# Patient Record
Sex: Male | Born: 1935 | Race: White | Hispanic: No | Smoking: Never smoker
Health system: Southern US, Community
[De-identification: ages and names within clinical notes are randomized; demographics above are authoritative.]

## PROBLEM LIST (undated history)

## (undated) DIAGNOSIS — D649 Anemia, unspecified: Secondary | ICD-10-CM

## (undated) DIAGNOSIS — F419 Anxiety disorder, unspecified: Secondary | ICD-10-CM

## (undated) DIAGNOSIS — N138 Other obstructive and reflux uropathy: Secondary | ICD-10-CM

## (undated) DIAGNOSIS — E785 Hyperlipidemia, unspecified: Secondary | ICD-10-CM

## (undated) DIAGNOSIS — K589 Irritable bowel syndrome without diarrhea: Secondary | ICD-10-CM

## (undated) DIAGNOSIS — H9190 Unspecified hearing loss, unspecified ear: Secondary | ICD-10-CM

## (undated) DIAGNOSIS — R972 Elevated prostate specific antigen [PSA]: Secondary | ICD-10-CM

## (undated) DIAGNOSIS — K219 Gastro-esophageal reflux disease without esophagitis: Secondary | ICD-10-CM

## (undated) DIAGNOSIS — L03119 Cellulitis of unspecified part of limb: Secondary | ICD-10-CM

## (undated) DIAGNOSIS — N401 Enlarged prostate with lower urinary tract symptoms: Secondary | ICD-10-CM

## (undated) DIAGNOSIS — N200 Calculus of kidney: Secondary | ICD-10-CM

## (undated) DIAGNOSIS — E039 Hypothyroidism, unspecified: Secondary | ICD-10-CM

## (undated) DIAGNOSIS — Z87442 Personal history of urinary calculi: Secondary | ICD-10-CM

## (undated) DIAGNOSIS — G2581 Restless legs syndrome: Secondary | ICD-10-CM

## (undated) HISTORY — DX: Elevated prostate specific antigen (PSA): R97.20

## (undated) HISTORY — DX: Other obstructive and reflux uropathy: N40.1

## (undated) HISTORY — DX: Hyperlipidemia, unspecified: E78.5

## (undated) HISTORY — PX: OTHER SURGICAL HISTORY: SHX169

## (undated) HISTORY — DX: Other obstructive and reflux uropathy: N13.8

## (undated) HISTORY — PX: FRACTURE SURGERY: SHX138

## (undated) HISTORY — PX: HERNIA REPAIR: SHX51

## (undated) HISTORY — DX: Calculus of kidney: N20.0

## (undated) HISTORY — DX: Irritable bowel syndrome, unspecified: K58.9

## (undated) HISTORY — PX: CHOLECYSTECTOMY: SHX55

## (undated) HISTORY — DX: Cellulitis of unspecified part of limb: L03.119

---

## 2004-03-24 ENCOUNTER — Ambulatory Visit: Payer: Self-pay | Admitting: Internal Medicine

## 2004-09-23 ENCOUNTER — Ambulatory Visit: Payer: Self-pay | Admitting: Internal Medicine

## 2005-03-28 ENCOUNTER — Ambulatory Visit: Payer: Self-pay | Admitting: Internal Medicine

## 2005-09-26 ENCOUNTER — Ambulatory Visit: Payer: Self-pay | Admitting: Internal Medicine

## 2005-09-30 ENCOUNTER — Ambulatory Visit: Payer: Self-pay | Admitting: Internal Medicine

## 2005-11-23 ENCOUNTER — Other Ambulatory Visit: Payer: Self-pay

## 2005-11-23 ENCOUNTER — Emergency Department: Payer: Self-pay | Admitting: Emergency Medicine

## 2005-11-23 ENCOUNTER — Ambulatory Visit: Payer: Self-pay | Admitting: Surgery

## 2005-11-24 ENCOUNTER — Ambulatory Visit: Payer: Self-pay | Admitting: Emergency Medicine

## 2005-12-28 ENCOUNTER — Ambulatory Visit: Payer: Self-pay | Admitting: Surgery

## 2006-03-30 ENCOUNTER — Ambulatory Visit: Payer: Self-pay | Admitting: Internal Medicine

## 2006-10-03 ENCOUNTER — Ambulatory Visit: Payer: Self-pay | Admitting: Internal Medicine

## 2007-04-05 ENCOUNTER — Ambulatory Visit: Payer: Self-pay | Admitting: Internal Medicine

## 2008-04-09 ENCOUNTER — Ambulatory Visit: Payer: Self-pay | Admitting: Internal Medicine

## 2009-05-01 HISTORY — PX: COLONOSCOPY: SHX174

## 2009-05-04 ENCOUNTER — Ambulatory Visit: Payer: Self-pay | Admitting: Gastroenterology

## 2010-03-02 ENCOUNTER — Ambulatory Visit: Payer: Self-pay | Admitting: Internal Medicine

## 2011-06-23 ENCOUNTER — Ambulatory Visit: Payer: Self-pay | Admitting: Surgery

## 2011-06-23 DIAGNOSIS — R9431 Abnormal electrocardiogram [ECG] [EKG]: Secondary | ICD-10-CM

## 2011-07-01 ENCOUNTER — Emergency Department: Payer: Self-pay | Admitting: Internal Medicine

## 2011-07-01 ENCOUNTER — Ambulatory Visit: Payer: Self-pay | Admitting: Surgery

## 2011-07-02 ENCOUNTER — Emergency Department: Payer: Self-pay | Admitting: Emergency Medicine

## 2013-12-14 LAB — URINALYSIS, COMPLETE
BILIRUBIN, UR: NEGATIVE
Bacteria: NONE SEEN
GLUCOSE, UR: NEGATIVE mg/dL (ref 0–75)
LEUKOCYTE ESTERASE: NEGATIVE
NITRITE: NEGATIVE
Ph: 5 (ref 4.5–8.0)
Protein: 30
Specific Gravity: 1.025 (ref 1.003–1.030)

## 2013-12-14 LAB — COMPREHENSIVE METABOLIC PANEL
ANION GAP: 6 — AB (ref 7–16)
Albumin: 3.2 g/dL — ABNORMAL LOW (ref 3.4–5.0)
Alkaline Phosphatase: 138 U/L — ABNORMAL HIGH
BUN: 23 mg/dL — ABNORMAL HIGH (ref 7–18)
Bilirubin,Total: 0.6 mg/dL (ref 0.2–1.0)
CALCIUM: 8.3 mg/dL — AB (ref 8.5–10.1)
CO2: 28 mmol/L (ref 21–32)
Chloride: 103 mmol/L (ref 98–107)
Creatinine: 1.17 mg/dL (ref 0.60–1.30)
GFR CALC AF AMER: 58 — AB
GFR CALC NON AF AMER: 48 — AB
Glucose: 104 mg/dL — ABNORMAL HIGH (ref 65–99)
OSMOLALITY: 278 (ref 275–301)
POTASSIUM: 4 mmol/L (ref 3.5–5.1)
SGOT(AST): 108 U/L — ABNORMAL HIGH (ref 15–37)
SGPT (ALT): 178 U/L — ABNORMAL HIGH
Sodium: 137 mmol/L (ref 136–145)
TOTAL PROTEIN: 7 g/dL (ref 6.4–8.2)

## 2013-12-14 LAB — CBC WITH DIFFERENTIAL/PLATELET
BASOS ABS: 0 10*3/uL (ref 0.0–0.1)
BASOS PCT: 0.3 %
Eosinophil #: 0 10*3/uL (ref 0.0–0.7)
Eosinophil %: 0 %
HCT: 36.7 % — ABNORMAL LOW (ref 40.0–52.0)
HGB: 12.4 g/dL — ABNORMAL LOW (ref 13.0–18.0)
LYMPHS ABS: 0.8 10*3/uL — AB (ref 1.0–3.6)
Lymphocyte %: 6 %
MCH: 32.8 pg (ref 26.0–34.0)
MCHC: 33.9 g/dL (ref 32.0–36.0)
MCV: 97 fL (ref 80–100)
MONO ABS: 0.6 x10 3/mm (ref 0.2–1.0)
MONOS PCT: 4.1 %
NEUTROS ABS: 12.5 10*3/uL — AB (ref 1.4–6.5)
Neutrophil %: 89.6 %
Platelet: 119 10*3/uL — ABNORMAL LOW (ref 150–440)
RBC: 3.79 10*6/uL — AB (ref 4.40–5.90)
RDW: 14 % (ref 11.5–14.5)
WBC: 14 10*3/uL — ABNORMAL HIGH (ref 3.8–10.6)

## 2013-12-15 ENCOUNTER — Inpatient Hospital Stay: Payer: Self-pay | Admitting: Internal Medicine

## 2013-12-15 LAB — HEMOGLOBIN A1C: Hemoglobin A1C: 5.1 % (ref 4.2–6.3)

## 2013-12-16 LAB — CBC WITH DIFFERENTIAL/PLATELET
Basophil #: 0 10*3/uL (ref 0.0–0.1)
Basophil %: 0.5 %
Eosinophil #: 0 10*3/uL (ref 0.0–0.7)
Eosinophil %: 0.4 %
HCT: 31.6 % — ABNORMAL LOW (ref 40.0–52.0)
HGB: 10.7 g/dL — AB (ref 13.0–18.0)
LYMPHS PCT: 18.9 %
Lymphocyte #: 1.4 10*3/uL (ref 1.0–3.6)
MCH: 32.7 pg (ref 26.0–34.0)
MCHC: 33.8 g/dL (ref 32.0–36.0)
MCV: 97 fL (ref 80–100)
MONO ABS: 0.7 x10 3/mm (ref 0.2–1.0)
Monocyte %: 9.2 %
Neutrophil #: 5.4 10*3/uL (ref 1.4–6.5)
Neutrophil %: 71 %
PLATELETS: 105 10*3/uL — AB (ref 150–440)
RBC: 3.26 10*6/uL — AB (ref 4.40–5.90)
RDW: 13.9 % (ref 11.5–14.5)
WBC: 7.6 10*3/uL (ref 3.8–10.6)

## 2013-12-16 LAB — BASIC METABOLIC PANEL
Anion Gap: 4 — ABNORMAL LOW (ref 7–16)
BUN: 11 mg/dL (ref 7–18)
CALCIUM: 7.8 mg/dL — AB (ref 8.5–10.1)
Chloride: 107 mmol/L (ref 98–107)
Co2: 28 mmol/L (ref 21–32)
Creatinine: 1.04 mg/dL (ref 0.60–1.30)
GLUCOSE: 86 mg/dL (ref 65–99)
Osmolality: 276 (ref 275–301)
POTASSIUM: 3.8 mmol/L (ref 3.5–5.1)
SODIUM: 139 mmol/L (ref 136–145)

## 2013-12-17 LAB — CBC WITH DIFFERENTIAL/PLATELET
Basophil #: 0.1 10*3/uL (ref 0.0–0.1)
Basophil %: 0.8 %
EOS PCT: 1.7 %
Eosinophil #: 0.1 10*3/uL (ref 0.0–0.7)
HCT: 31.1 % — AB (ref 40.0–52.0)
HGB: 10.4 g/dL — ABNORMAL LOW (ref 13.0–18.0)
LYMPHS ABS: 1.7 10*3/uL (ref 1.0–3.6)
Lymphocyte %: 26.2 %
MCH: 32.6 pg (ref 26.0–34.0)
MCHC: 33.5 g/dL (ref 32.0–36.0)
MCV: 97 fL (ref 80–100)
Monocyte #: 0.8 x10 3/mm (ref 0.2–1.0)
Monocyte %: 12.8 %
NEUTROS ABS: 3.7 10*3/uL (ref 1.4–6.5)
Neutrophil %: 58.5 %
Platelet: 108 10*3/uL — ABNORMAL LOW (ref 150–440)
RBC: 3.2 10*6/uL — ABNORMAL LOW (ref 4.40–5.90)
RDW: 14.1 % (ref 11.5–14.5)
WBC: 6.3 10*3/uL (ref 3.8–10.6)

## 2013-12-18 LAB — BASIC METABOLIC PANEL
Anion Gap: 5 — ABNORMAL LOW (ref 7–16)
BUN: 6 mg/dL — ABNORMAL LOW (ref 7–18)
CO2: 29 mmol/L (ref 21–32)
Calcium, Total: 8.2 mg/dL — ABNORMAL LOW (ref 8.5–10.1)
Chloride: 104 mmol/L (ref 98–107)
Creatinine: 0.91 mg/dL (ref 0.60–1.30)
Glucose: 94 mg/dL (ref 65–99)
Osmolality: 273 (ref 275–301)
POTASSIUM: 3.7 mmol/L (ref 3.5–5.1)
Sodium: 138 mmol/L (ref 136–145)

## 2013-12-18 LAB — CBC WITH DIFFERENTIAL/PLATELET
BASOS ABS: 0.1 10*3/uL (ref 0.0–0.1)
BASOS PCT: 0.7 %
EOS ABS: 0.1 10*3/uL (ref 0.0–0.7)
Eosinophil %: 1.4 %
HCT: 32.4 % — ABNORMAL LOW (ref 40.0–52.0)
HGB: 10.9 g/dL — AB (ref 13.0–18.0)
LYMPHS PCT: 17.9 %
Lymphocyte #: 1.4 10*3/uL (ref 1.0–3.6)
MCH: 32.6 pg (ref 26.0–34.0)
MCHC: 33.6 g/dL (ref 32.0–36.0)
MCV: 97 fL (ref 80–100)
MONO ABS: 1 x10 3/mm (ref 0.2–1.0)
Monocyte %: 12.7 %
NEUTROS ABS: 5.3 10*3/uL (ref 1.4–6.5)
Neutrophil %: 67.3 %
Platelet: 141 10*3/uL — ABNORMAL LOW (ref 150–440)
RBC: 3.33 10*6/uL — AB (ref 4.40–5.90)
RDW: 13.8 % (ref 11.5–14.5)
WBC: 7.9 10*3/uL (ref 3.8–10.6)

## 2013-12-18 LAB — VANCOMYCIN, TROUGH: Vancomycin, Trough: 7 ug/mL — ABNORMAL LOW (ref 10–20)

## 2013-12-19 LAB — BASIC METABOLIC PANEL
ANION GAP: 6 — AB (ref 7–16)
BUN: 4 mg/dL — AB (ref 7–18)
CALCIUM: 7.9 mg/dL — AB (ref 8.5–10.1)
Chloride: 107 mmol/L (ref 98–107)
Co2: 28 mmol/L (ref 21–32)
Creatinine: 0.87 mg/dL (ref 0.60–1.30)
EGFR (African American): >60
EGFR (Non-African Amer.): >60
GLUCOSE: 79 mg/dL (ref 65–99)
Osmolality: 277 (ref 275–301)
Potassium: 3.7 mmol/L (ref 3.5–5.1)
Sodium: 141 mmol/L (ref 136–145)

## 2013-12-19 LAB — CBC WITH DIFFERENTIAL/PLATELET
BASOS ABS: 0.1 10*3/uL (ref 0.0–0.1)
BASOS PCT: 0.6 %
EOS ABS: 0.2 10*3/uL (ref 0.0–0.7)
Eosinophil %: 2.1 %
HCT: 30.7 % — ABNORMAL LOW (ref 40.0–52.0)
HGB: 10.4 g/dL — AB (ref 13.0–18.0)
LYMPHS ABS: 1.9 10*3/uL (ref 1.0–3.6)
LYMPHS PCT: 19.1 %
MCH: 32.6 pg (ref 26.0–34.0)
MCHC: 33.8 g/dL (ref 32.0–36.0)
MCV: 97 fL (ref 80–100)
MONOS PCT: 9.8 %
Monocyte #: 1 x10 3/mm (ref 0.2–1.0)
NEUTROS ABS: 6.7 10*3/uL — AB (ref 1.4–6.5)
Neutrophil %: 68.4 %
Platelet: 156 10*3/uL (ref 150–440)
RBC: 3.18 10*6/uL — AB (ref 4.40–5.90)
RDW: 14 % (ref 11.5–14.5)
WBC: 9.7 10*3/uL (ref 3.8–10.6)

## 2013-12-19 LAB — CULTURE, BLOOD (SINGLE)

## 2013-12-20 LAB — CBC WITH DIFFERENTIAL/PLATELET
Comment - H1-Com1: NORMAL
Eosinophil: 1 %
HCT: 30.1 % — ABNORMAL LOW (ref 40.0–52.0)
HGB: 10.1 g/dL — ABNORMAL LOW (ref 13.0–18.0)
Lymphocytes: 31 %
MCH: 33 pg (ref 26.0–34.0)
MCHC: 33.6 g/dL (ref 32.0–36.0)
MCV: 98 fL (ref 80–100)
Monocytes: 6 %
Myelocyte: 2 %
Platelet: 198 10*3/uL (ref 150–440)
RBC: 3.07 10*6/uL — ABNORMAL LOW (ref 4.40–5.90)
RDW: 13.7 % (ref 11.5–14.5)
Segmented Neutrophils: 60 %
WBC: 9.2 10*3/uL (ref 3.8–10.6)

## 2013-12-20 LAB — BASIC METABOLIC PANEL
ANION GAP: 4 — AB (ref 7–16)
BUN: 5 mg/dL — ABNORMAL LOW (ref 7–18)
CALCIUM: 7.7 mg/dL — AB (ref 8.5–10.1)
Chloride: 109 mmol/L — ABNORMAL HIGH (ref 98–107)
Co2: 29 mmol/L (ref 21–32)
Creatinine: 0.84 mg/dL (ref 0.60–1.30)
EGFR (Non-African Amer.): >60
Glucose: 76 mg/dL (ref 65–99)
Osmolality: 279 (ref 275–301)
Potassium: 3.9 mmol/L (ref 3.5–5.1)
Sodium: 142 mmol/L (ref 136–145)

## 2013-12-20 LAB — VANCOMYCIN, TROUGH: VANCOMYCIN, TROUGH: 16 ug/mL (ref 10–20)

## 2013-12-21 LAB — CBC WITH DIFFERENTIAL/PLATELET
Bands: 1 %
Eosinophil: 2 %
HCT: 30.8 % — ABNORMAL LOW
HGB: 10.1 g/dL — ABNORMAL LOW
Lymphocytes: 27 %
MCH: 32.4 pg
MCHC: 32.9 g/dL
MCV: 98 fL
Metamyelocyte: 4 %
Monocytes: 6 %
Platelet: 243 x10 3/mm 3
RBC: 3.13 x10 6/mm 3 — ABNORMAL LOW
RDW: 14 %
Segmented Neutrophils: 60 %
WBC: 9.6 x10 3/mm 3

## 2014-04-01 DIAGNOSIS — E039 Hypothyroidism, unspecified: Secondary | ICD-10-CM | POA: Insufficient documentation

## 2014-04-01 DIAGNOSIS — H913 Deaf nonspeaking, not elsewhere classified: Secondary | ICD-10-CM | POA: Insufficient documentation

## 2014-04-01 DIAGNOSIS — G2581 Restless legs syndrome: Secondary | ICD-10-CM | POA: Insufficient documentation

## 2014-05-24 NOTE — H&P (Signed)
PATIENT NAMWaldemar Huerta:  Huerta, Ronald D MR#:  [id number] DATE OF BIRTH:  07/15/1934  DATE OF ADMISSION:  12/15/2013  PRIMARY CARE PHYSICIAN: Ronald ReichmannVishwanath Hande, MD  REQUESTING PHYSICIAN: Ronald BreezeGraydon S. Goodman, MD  CHIEF COMPLAINT: Right leg swelling and pain.  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 Patient is unable to walk, started to have a fever. Concerning this, came to the Emergency Department. Workup in the Emergency Department, patient is found to have fever of 101.4, has elevated white blood cell count of 14,000 with a left shift of 90%. Patient is given vancomycin in the Emergency Department.  PAST MEDICAL HISTORY: Hypothyroidism.   PAST SURGICAL HISTORY: 1.  Cholecystectomy.  2.  Hernia repair.  ALLERGIES: No known drug allergies.  HOME MEDICATIONS: Synthroid.   SOCIAL HISTORY: No history of smoking, drinking alcohol, or using illicit drugs. Lives with his daughter.  FAMILY HISTORY: Hypertension.  REVIEW OF SYSTEMS: CONSTITUTIONAL: Denies any generalized weakness. EYES: No change in vision. EARS, NOSE, AND THROAT: Patient is deaf.  RESPIRATORY: No cough, shortness of breath. CARDIOVASCULAR: No chest pain, palpitations. GASTROINTESTINAL: No nausea, vomiting, abdominal pain. GENITOURINARY: No dysuria or hematuria. HEMATOLOGIC: No easy bruising or bleeding. SKIN: Has right lower extremity swelling. MUSCULOSKELETAL: Pain in the right ankle.  NEUROLOGIC: No weakness or numbness in any part of the body.  PHYSICAL EXAMINATION: GENERAL: This is a well-built, well-nourished, age-appropriate male. VITAL SIGNS: Temperature 101.4, pulse 81, blood pressure 118/62, respiratory rate of 18, oxygen saturation 99% on room air. HEENT: Head normocephalic, atraumatic. There is no scleral icterus. Conjunctivae normal. Pupils  equal and reactive. Mucous membranes moist. No pharyngeal erythema. NECK: Supple. No lymphadenopathy. No JVD. No carotid bruit. No thyromegaly.  CHEST: Has no focal tenderness. LUNGS: Bilaterally clear to auscultation. HEART: S1, S2 regular. No murmurs are heard. ABDOMEN: Bowel sounds plus. Soft, nontender, nondistended. No hepatosplenomegaly. EXTREMITIES: Right foot with redness and swelling extending up to the knee with lymphangitic streaking. Pulses 2+. MUSCULOSKELETAL: Good range of motion all extremities.  NEUROLOGIC: Patient is alert, oriented to place, person, and time. Cranial nerves II-XII intact. Motor 5/5 in upper and lower extremities.   LABORATORY DATA: CBC: WBC of 14,000, hemoglobin 12.4, platelet count of 119,000. BMP: BUN 23, creatinine of 1.17. UA negative for nitrites and leukocyte esterase.  ASSESSMENT AND PLAN: Ronald Huerta, a 80 year old male, comes with right leg redness and cellulitis. 1.  Cellulitis of the right foot and ankle. This is secondary to athlete's foot. Could be the source of the entry. Denies any trauma. Start the patient on vancomycin. The patient is not a diabetic. No risk for gram-negative organisms. 2.  Hypothyroidism. Continue the Synthroid. 3.  Athlete's foot. Apply clotrimazole ointment to the interdigital space.  TIME SPENT: 50 minutes.   ____________________________ Ronald GriffinsPadmaja Malgorzata Albert, MD pv:ST D: 12/15/2013 00:10:43 ET T: 12/15/2013 01:07:41 ET JOB#: [id number]  cc: Ronald GriffinsPadmaja Kari Kerth, MD, <Dictator> Ronald ReichmannVishwanath Hande, MD Ronald GriffinsPADMAJA Ridhi Hoffert MD ELECTRONICALLY SIGNED 12/27/2013 22:37

## 2014-05-24 NOTE — Consult Note (Signed)
PATIENT NAMWaldemar Huerta:  Ronald Huerta MR#:  [id number] DATE OF BIRTH:  09-19-1934  DATE OF CONSULTATION:  12/17/2013  REFERRING PHYSICIAN:   CONSULTING PHYSICIAN:  Stann Mainlandavid P. Sampson GoonFitzgerald, MD  REQUESTING PHYSICIAN:  Dr. Barbette ReichmannVishwanath Hande.    REASON FOR CONSULTATION:  Progressive cellulitis.   HISTORY OF PRESENT ILLNESS: This is a very pleasant 80 year old gentleman admitted November 15 with right lower extremity redness and pain. He has a history of hypothyroidism and is deaf, but otherwise relatively healthy. He said this had been ongoing for 2 days and he had been having difficulty walking and fever. In the Emergency Room he was found to have a fever of 101.4 and a white count of 14,000 with 90% left shift. He was given vancomycin and then admitted.   Since admission the redness has continues to spread and he has had fever. He has been having some redness and streaking up his inner thigh.   PAST MEDICAL HISTORY:  1.  Hypothyroidism.  2.  Deafness.   PAST SURGICAL HISTORY: Cholecystectomy, hernia repair.   ALLERGIES: No known drug allergies.   HOME MEDICATIONS: Synthroid.   ANTIBIOTICS SINCE ADMISSION: Include Keflex given November 14, ceftriaxone given November 15, Zosyn begun November 15, and vancomycin begun November 14.   ALLERGIES: No known drug allergies.   REVIEW OF SYSTEMS: Unable to be obtained due to deafness.   PHYSICAL EXAMINATION:  VITAL SIGNS: Temperature currently is 98.9, T-max last 24 hours 100.8, pulse 69, blood pressure 103/58, respirations 19, saturation 93% on room air.  GENERAL: He is pleasant, interactive, in no acute distress.  HEENT: Pupils equal, round, reactive to light and accommodation. Extraocular movements are intact. Oropharynx is clear.  NECK: Supple.  HEART: Regular.  LUNGS: Clear.  ABDOMEN: Soft, nontender.  EXTREMITIES: His right lower extremity 2 + edema. He has moderate redness anteriorly over his shins wrapping around circumferentially. This extends  up to almost his knee. There is streaking on the interior of his upper thigh. No open wounds, bullae or abscesses are noted.   DATA: Ultrasound of the leg November 16 negative for DVT. White blood count on admission 14.0, currently 6.3, hemoglobin 10.4, platelets 108,000, on admission 119,000. Blood cultures 2 of 2 November 14 negative. Urinalysis negative. Renal function shows normal creatinine at 1.04. LFTs showed slight elevation of alkaline phosphatase, AST, and ALT.   IMPRESSION: A 80 year old gentleman, relatively healthy, admitted with acute onset right lower extremity redness, swelling, fever, and leukocytosis. This is most likely a streptococcal cellulitis. He is currently on vancomycin and Zosyn. He has had recurrent fever to 100.8, but his white count has improved remarkably. He does have some lymphangitic spread up his inner thigh. I think the main issue at this point is that he is not elevating the leg and there is massive edema.   RECOMMENDATIONS:  1.  Continue vancomycin and Zosyn.  2.  I have explained to the patient to elevate the leg and given him extra blankets and pillows. Will recommend to keep it elevated over the next 24 hours.  3.  I have marked the area of erythema with a pen.  4.  He could likely be discharged in the next few days on oral therapy if the leg improves with elevation.  5.  Thank you for the consult. I will be glad to follow with you.      ____________________________ Stann Mainlandavid P. Sampson GoonFitzgerald, MD dpf:bu Huerta: 12/17/2013 15:07:36 ET T: 12/17/2013 15:32:52 ET JOB#: [id number]  cc: Stann Mainlandavid P. Sampson GoonFitzgerald, MD, <  Dictator> Omelia Marquart Sampson Goon MD ELECTRONICALLY SIGNED 12/18/2013 20:49

## 2014-05-24 NOTE — Discharge Summary (Signed)
PATIENT NAME:  Ronald DickensHANDY, Scotty D MR#:  [id number] DATE OF BIRTH:  April 26, 1934  DATE OF ADMISSION:  12/15/2013 DATE OF DISCHARGE:  12/23/2013  DIAGNOSES AT TIME OF DISCHARGE: 1. Cellulitis in the right foot, ankle and right lower extremity.  2. History of hypothyroidism.   CHIEF COMPLAINT: Right leg swelling and pain.   HOSPITAL COURSE: [name] is a 80 year old male who is a deaf mute with a history of hypothyroidism, presented to the ED complaining of redness, swelling and pain in the right ankle for about 2 days duration. The patient also had a fever and had difficulty walking. The patient was noted to have a fever of 101.4 on admission with an elevated white cell count of 14,000 with left shift 90%. He was given IV vancomycin in the ED and subsequently started on IV Rocephin. This was changed IV Zosyn and vancomycin. During his stay in the hospital, he was also evaluated by Dr. Sherrie MustacheFisher, infectious disease specialist, who agreed with antibiotic therapy. This was subsequently changed to IV vancomycin and symptomatically, the patient did improve with improvement in white cell count down to 9.6 on discharge. He also had some right lower extremity pain, for which he was started gabapentin which appeared to help to some extent. He was seen by physical therapy. Venous Doppler was also done which was negative for DVT. He was discharged in stable condition on the following medications.   DISCHARGE MEDICATIONS: Cephalexin 500 mg p.o. 3 times a day for 14 days, doxycycline 100 mg p.o. b.i.d. for 14 days, acetaminophen 325 two tablets every 6 hours p.r.n., gabapentin 100 mg p.o. every 8, Ensure Plus 3 times a day with meals, docusate sodium 100 mg p.o. b.i.d., B12 - 1000 mcg once a day, fish oil 500 mg 2 capsules b.i.d., folic acid 0.4 mg once a day, saw palmetto 540 mg once a day, levothyroxine 25 mcg once a day and omeprazole 20 mg once a day.   FOLLOWUP: The patient has been advised to follow up with me, Dr.  Marcello FennelHande, in 2 to 3 weeks' time. He has been advised to keep his right leg elevated as much as possible. Call back with any questions or concerns.   TOTAL TIME SPENT ON DISCHARGING THIS PATIENT: 35 minutes.     ____________________________ Barbette ReichmannVishwanath Nathanuel Cabreja, MD vh:TT D: 12/27/2013 13:15:39 ET T: 12/27/2013 14:27:22 ET JOB#: [id number]  cc: Barbette ReichmannVishwanath Rockelle Heuerman, MD, <Dictator> Barbette ReichmannVISHWANATH Clark Clowdus MD ELECTRONICALLY SIGNED 12/31/2013 17:49

## 2014-05-25 NOTE — Op Note (Signed)
PATIENT NAME:  Ronald DickensHANDY, Earnestine D MR#:  [id number] DATE OF BIRTH:  [birth year]  DATE OF PROCEDURE:  07/01/2011  PREOPERATIVE DIAGNOSIS: Right inguinal hernia.   POSTOPERATIVE DIAGNOSIS: Right inguinal hernia.   PROCEDURE: Right inguinal hernia repair.   SURGEON: Adella HareJ. Wilton Smith, MD  ANESTHESIA: General.   INDICATIONS: This 80 year old male has recently had bulging in the right groin and right inguinal hernia was demonstrated on physical exam and repair recommended for definitive treatment.   DESCRIPTION OF PROCEDURE: The patient was placed on the operating table in the supine position under general anesthesia. The right lower quadrant was clipped and prepared with ChloraPrep, draped in sterile manner.   A right lower quadrant transversely oriented suprapubic incision was made, carried down through subcutaneous tissues. One traversing vein was divided between 4-0 Vicryl ligatures. Scarpa's fascia was incised. The external oblique aponeurosis was incised along the course of its fibers to open the external ring and expose the inguinal cord structures. The cord structures were mobilized exposing a direct inguinal hernia. The cremaster fibers were spread to examine the cord structures. There was a cord lipoma which was dissected free from surrounding structures, was some 4 cm in length and amputated at the level of the internal ring and ligated with 4-0 Vicryl. Cord structures were further examined. There was no indirect hernia. There was a direct inguinal hernia. The attenuated transversalis fascia was incised circumferentially and the sac was separated from the transversalis fascia and a portion of attenuated transversalis fascia was resected. The sac was inverted. Next, repair was carried out with a row of sutures beginning at the pubic tubercle suturing the conjoined tendon to the shelving edge of the inguinal ligament incorporating transversalis fascia into the repair. Suture line was carried up to the  internal ring. The repair looked good. There was no tension on the repair and the cord structures were replaced along the floor of the inguinal canal. Cut edges of the external oblique aponeurosis were closed with a running 4-0 Vicryl. The deep fascia superior and lateral to the repair site was infiltrated with 0.5% Sensorcaine with epinephrine. Subcutaneous tissues were infiltrated. Scarpa's fascia was closed with interrupted 4-0 Vicryl. The skin was closed with running 5-0 Monocryl subcuticular sutures and Dermabond. The testicle remained in the scrotum. The patient tolerated surgery satisfactorily and was then moved to the recovery room for postoperative care.  ____________________________ J. Renda RollsWilton Smith, MD jws:cms D: 07/01/2011 10:26:09 ET T: 07/01/2011 10:44:41 ET JOB#: [id number]  cc: Adella HareJ. Wilton Smith, MD, <Dictator> Adella HareWILTON J SMITH MD ELECTRONICALLY SIGNED 07/01/2011 18:42

## 2015-01-07 ENCOUNTER — Encounter: Payer: Self-pay | Admitting: Emergency Medicine

## 2015-01-07 ENCOUNTER — Emergency Department: Payer: Medicare Other

## 2015-01-07 ENCOUNTER — Emergency Department
Admission: EM | Admit: 2015-01-07 | Discharge: 2015-01-07 | Disposition: A | Discharge status: Home / Self Care | Payer: Medicare Other | Attending: Emergency Medicine | Admitting: Emergency Medicine

## 2015-01-07 DIAGNOSIS — Y998 Other external cause status: Secondary | ICD-10-CM | POA: Insufficient documentation

## 2015-01-07 DIAGNOSIS — M25512 Pain in left shoulder: Secondary | ICD-10-CM

## 2015-01-07 DIAGNOSIS — S4992XA Unspecified injury of left shoulder and upper arm, initial encounter: Secondary | ICD-10-CM | POA: Insufficient documentation

## 2015-01-07 DIAGNOSIS — T148XXA Other injury of unspecified body region, initial encounter: Secondary | ICD-10-CM

## 2015-01-07 DIAGNOSIS — W19XXXA Unspecified fall, initial encounter: Secondary | ICD-10-CM

## 2015-01-07 DIAGNOSIS — W01198A Fall on same level from slipping, tripping and stumbling with subsequent striking against other object, initial encounter: Secondary | ICD-10-CM | POA: Diagnosis not present

## 2015-01-07 DIAGNOSIS — Y9301 Activity, walking, marching and hiking: Secondary | ICD-10-CM | POA: Insufficient documentation

## 2015-01-07 DIAGNOSIS — T148 Other injury of unspecified body region: Secondary | ICD-10-CM | POA: Diagnosis not present

## 2015-01-07 DIAGNOSIS — S0990XA Unspecified injury of head, initial encounter: Secondary | ICD-10-CM | POA: Diagnosis present

## 2015-01-07 DIAGNOSIS — S0081XA Abrasion of other part of head, initial encounter: Secondary | ICD-10-CM | POA: Insufficient documentation

## 2015-01-07 DIAGNOSIS — S79912A Unspecified injury of left hip, initial encounter: Secondary | ICD-10-CM | POA: Diagnosis not present

## 2015-01-07 DIAGNOSIS — Y92009 Unspecified place in unspecified non-institutional (private) residence as the place of occurrence of the external cause: Secondary | ICD-10-CM | POA: Diagnosis not present

## 2015-01-07 HISTORY — DX: Anxiety disorder, unspecified: F41.9

## 2015-01-07 HISTORY — DX: Restless legs syndrome: G25.81

## 2015-01-07 MED ORDER — TRAMADOL HCL 50 MG PO TABS
50.0000 mg | ORAL_TABLET | Freq: Four times a day (QID) | ORAL | Status: DC | PRN
Start: 2015-01-07 — End: 2015-04-11

## 2015-01-07 NOTE — ED Provider Notes (Signed)
[facility] Emergency Department Provider Note  Time seen: 4:41 PM  I have reviewed the triage vital signs and the nursing notes.   HISTORY  Chief Complaint [facility] interpreter used Via iPad for American sign language.   HPI Ronald Huerta is a 80 y.o. male with a past medical history of anxiety rashes leg syndrome presents the emergency department after a fall. According to the patient with the use of an interpreter, he was walking into his house when he tripped on a wire falling onto his left side and striking the left side of his head. Denies loss of consciousness. States he was doing fairly well afterwards, but has aggressively developed more pain in his left shoulder, left hand, and left side of his head, so he came to the emergency department for evaluation.     Past Medical History  Diagnosis Date  . Anxiety   . Restless leg syndrome     There are no active problems to display for this patient.   Past Surgical History  Procedure Laterality Date  . Cholecystectomy    . Hernia repair      No current outpatient prescriptions on file.  Allergies Review of patient's allergies indicates no known allergies.  No family history on file.  Social History Social History  Substance Use Topics  . Smoking status: Never Smoker   . Smokeless tobacco: None  . Alcohol Use: No    Review of Systems Constitutional: Negative for fever. negative for loss of consciousness  Cardiovascular: Negative for chest pain. Respiratory: Negative for shortness of breath. Gastrointestinal: Negative for abdominal pain Genitourinary: Negative for dysuria. Musculoskeletal: Negative for back pain. negative for neck pain. Positive for left shoulder pain positive for left him pain Skin:  Small abrasion to left head. Neurological: Negative for headache 10-point ROS otherwise negative.  ____________________________________________   PHYSICAL  EXAM:  VITAL SIGNS: ED Triage Vitals  Enc Vitals Group     BP --      Pulse --      Resp --      Temp --      Temp src --      SpO2 --      Weight 01/07/15 1539 155 lb (70.308 kg)     Height 01/07/15 1539  (1.778 m)     Head Cir --      Peak Flow --      Pain Score 01/07/15 1540 3     Pain Loc --      Pain Edu? --      Excl. in GC? --     Constitutional: Alert. Well appearing and in no distress. Eyes: Normal exam ENT   Head: Normocephalic, small abrasion to left forehead.   Mouth/Throat: Mucous membranes are moist. Cardiovascular: Normal rate, regular rhythm. No murmur Respiratory: Normal respiratory effort without tachypnea nor retractions. Breath sounds are clear and equal bilaterally. No wheezes/rales/rhonchi. Gastrointestinal: Soft and nontender. No distention.  Musculoskeletal:  Moderate tenderness over the fifth metacarpal of the left hand, mild tenderness to range of motion in the left shoulder, otherwise atraumatic extremities. No C-spine tenderness. No back tenderness. Neurologic:  Normal speech and language. No gross focal neurologic deficits  Skin:  Skin is warm, dry and intact.  Psychiatric: Mood and affect are normal. Speech and behavior are normal.   ____________________________________________   RADIOLOGY  Imaging largely within normal limits besides a possible glenoid avulsion fracture  ____________________________________________   INITIAL IMPRESSION / ASSESSMENT  AND PLAN / ED COURSE  Pertinent labs & imaging results that were available during my care of the patient were reviewed by me and considered in my medical decision making (see chart for details).  Patient presents the emergency department after mechanical fall. We'll obtain images of the patient's left hand, left shoulder and head.  We will discharge the patient home with a sling as needed for comfort, orthopedics follow-up in 7 days if not better. Patient agreeable to plan. We  will place the patient on a short course of Ultram for pain.  ____________________________________________   FINAL CLINICAL IMPRESSION(S) / ED DIAGNOSES  fall Contusions  Minna AntisKevin Teal Raben, MD 01/07/15 1728

## 2015-01-07 NOTE — Discharge Instructions (Signed)
Please take your pain medication as needed, as prescribed for discomfort. Please wear a sling as needed for discomfort, however it is not necessary to wear the sling at all times. Please follow-up with orthopedics but calling the number provided if he continued to have pain/discomfort in 7 days.    Musculoskeletal Pain Musculoskeletal pain is muscle and boney aches and pains. These pains can occur in any part of the body. Your caregiver may treat you without knowing the cause of the pain. They may treat you if blood or urine tests, X-rays, and other tests were normal.  CAUSES There is often not a definite cause or reason for these pains. These pains may be caused by a type of germ (virus). The discomfort may also come from overuse. Overuse includes working out too hard when your body is not fit. Boney aches also come from weather changes. Bone is sensitive to atmospheric pressure changes. HOME CARE INSTRUCTIONS   Ask when your test results will be ready. Make sure you get your test results.  Only take over-the-counter or prescription medicines for pain, discomfort, or fever as directed by your caregiver. If you were given medications for your condition, do not drive, operate machinery or power tools, or sign legal documents for 24 hours. Do not drink alcohol. Do not take sleeping pills or other medications that may interfere with treatment.  Continue all activities unless the activities cause more pain. When the pain lessens, slowly resume normal activities. Gradually increase the intensity and duration of the activities or exercise.  During periods of severe pain, bed rest may be helpful. Lay or sit in any position that is comfortable.  Putting ice on the injured area.  Put ice in a bag.  Place a towel between your skin and the bag.  Leave the ice on for 15 to 20 minutes, 3 to 4 times a day.  Follow up with your caregiver for continued problems and no reason can be found for the pain. If  the pain becomes worse or does not go away, it may be necessary to repeat tests or do additional testing. Your caregiver may need to look further for a possible cause. SEEK IMMEDIATE MEDICAL CARE IF:  You have pain that is getting worse and is not relieved by medications.  You develop chest pain that is associated with shortness or breath, sweating, feeling sick to your stomach (nauseous), or throw up (vomit).  Your pain becomes localized to the abdomen.  You develop any new symptoms that seem different or that concern you. MAKE SURE YOU:   Understand these instructions.  Will watch your condition.  Will get help right away if you are not doing well or get worse.   This information is not intended to replace advice given to you by your health care provider. Make sure you discuss any questions you have with your health care provider.   Document Released: 01/17/2005 Document Revised: 04/11/2011 Document Reviewed: 09/21/2012 Elsevier Interactive Patient Education [employer]

## 2015-01-07 NOTE — ED Notes (Addendum)
[facility] [phone]- interpreter- pt is deaf.   Pt tripped and fell over a cord injuring his left hand and lower leg and left knee. Also hit the left side of his head. Denies any loc.

## 2015-01-07 NOTE — ED Notes (Signed)
NAD noted at this time. Pt taken to lobby via wheelchair by family in room. Pt denies comments/concerns at this time.

## 2015-02-06 ENCOUNTER — Encounter: Payer: Self-pay | Admitting: *Deleted

## 2015-02-10 ENCOUNTER — Ambulatory Visit (INDEPENDENT_AMBULATORY_CARE_PROVIDER_SITE_OTHER): Payer: Self-pay | Admitting: Urology

## 2015-02-10 ENCOUNTER — Encounter: Payer: Self-pay | Admitting: Urology

## 2015-02-10 VITALS — BP 112/58 | HR 89 | Ht 69.0 in | Wt 165.4 lb

## 2015-02-10 DIAGNOSIS — N401 Enlarged prostate with lower urinary tract symptoms: Secondary | ICD-10-CM

## 2015-02-10 DIAGNOSIS — N138 Other obstructive and reflux uropathy: Secondary | ICD-10-CM | POA: Insufficient documentation

## 2015-02-10 DIAGNOSIS — Z87898 Personal history of other specified conditions: Secondary | ICD-10-CM

## 2015-02-10 MED ORDER — TERAZOSIN HCL 5 MG PO CAPS: 5.0000 mg | ORAL_CAPSULE | Freq: Once | ORAL | Status: DC
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
## 2015-02-11 ENCOUNTER — Telehealth: Payer: Self-pay

## 2015-02-11 LAB — PSA: PROSTATE SPECIFIC AG, SERUM: 2.4 ng/mL (ref 0.0–4.0)

## 2015-02-11 NOTE — Telephone Encounter (Signed)
-----   Message from Harle BattiestShannon A McGowan, PA-C sent at 02/11/2015  8:32 AM EST ----- PSA is stable.  We will see him in one year.

## 2015-02-11 NOTE — Telephone Encounter (Signed)
Spoke with pt son-in law and made aware labs are stable and we will see him in 1 year. Son-in law voiced understanding.

## 2015-03-24 ENCOUNTER — Ambulatory Visit
Admission: RE | Admit: 2015-03-24 | Discharge: 2015-03-24 | Disposition: A | Discharge status: Home / Self Care | Payer: Medicare Other | Source: Ambulatory Visit | Attending: Internal Medicine | Admitting: Internal Medicine

## 2015-03-24 ENCOUNTER — Other Ambulatory Visit: Payer: Self-pay | Admitting: Internal Medicine

## 2015-03-24 DIAGNOSIS — R590 Localized enlarged lymph nodes: Secondary | ICD-10-CM | POA: Insufficient documentation

## 2015-03-24 DIAGNOSIS — M7989 Other specified soft tissue disorders: Secondary | ICD-10-CM | POA: Insufficient documentation

## 2015-04-03 ENCOUNTER — Encounter: Payer: Self-pay | Admitting: Internal Medicine

## 2015-04-03 ENCOUNTER — Inpatient Hospital Stay
Admission: AD | Admit: 2015-04-03 | Discharge: 2015-04-06 | DRG: 603 | Disposition: A | Discharge status: Home / Self Care | Payer: Medicare Other | Source: Ambulatory Visit | Attending: Internal Medicine | Admitting: Internal Medicine

## 2015-04-03 DIAGNOSIS — Z800 Family history of malignant neoplasm of digestive organs: Secondary | ICD-10-CM

## 2015-04-03 DIAGNOSIS — E039 Hypothyroidism, unspecified: Secondary | ICD-10-CM | POA: Diagnosis present

## 2015-04-03 DIAGNOSIS — Z87891 Personal history of nicotine dependence: Secondary | ICD-10-CM | POA: Diagnosis not present

## 2015-04-03 DIAGNOSIS — B9729 Other coronavirus as the cause of diseases classified elsewhere: Secondary | ICD-10-CM | POA: Diagnosis present

## 2015-04-03 DIAGNOSIS — L97909 Non-pressure chronic ulcer of unspecified part of unspecified lower leg with unspecified severity: Secondary | ICD-10-CM | POA: Diagnosis present

## 2015-04-03 DIAGNOSIS — L03116 Cellulitis of left lower limb: Secondary | ICD-10-CM | POA: Diagnosis present

## 2015-04-03 DIAGNOSIS — I83009 Varicose veins of unspecified lower extremity with ulcer of unspecified site: Secondary | ICD-10-CM | POA: Diagnosis present

## 2015-04-03 DIAGNOSIS — Z8042 Family history of malignant neoplasm of prostate: Secondary | ICD-10-CM

## 2015-04-03 DIAGNOSIS — E785 Hyperlipidemia, unspecified: Secondary | ICD-10-CM | POA: Diagnosis present

## 2015-04-03 LAB — CBC WITH DIFFERENTIAL/PLATELET
BASOS PCT: 1 %
Basophils Absolute: 0.1 10*3/uL (ref 0–0.1)
Eosinophils Absolute: 0.1 10*3/uL (ref 0–0.7)
Eosinophils Relative: 1 %
HEMATOCRIT: 35.8 % — AB (ref 40.0–52.0)
Hemoglobin: 12.3 g/dL — ABNORMAL LOW (ref 13.0–18.0)
LYMPHS ABS: 1.2 10*3/uL (ref 1.0–3.6)
LYMPHS PCT: 19 %
MCH: 32.9 pg (ref 26.0–34.0)
MCHC: 34.4 g/dL (ref 32.0–36.0)
MCV: 95.7 fL (ref 80.0–100.0)
MONO ABS: 0.5 10*3/uL (ref 0.2–1.0)
MONOS PCT: 9 %
NEUTROS ABS: 4.3 10*3/uL (ref 1.4–6.5)
Neutrophils Relative %: 70 %
Platelets: 165 10*3/uL (ref 150–440)
RBC: 3.74 MIL/uL — ABNORMAL LOW (ref 4.40–5.90)
RDW: 14.1 % (ref 11.5–14.5)
WBC: 6.1 10*3/uL (ref 3.8–10.6)

## 2015-04-03 LAB — BASIC METABOLIC PANEL
ANION GAP: 7 (ref 5–15)
BUN: 18 mg/dL (ref 6–20)
CALCIUM: 9.1 mg/dL (ref 8.9–10.3)
CO2: 27 mmol/L (ref 22–32)
Chloride: 106 mmol/L (ref 101–111)
Creatinine, Ser: 0.88 mg/dL (ref 0.61–1.24)
GFR calc Af Amer: >60 mL/min (ref >60)
GFR calc non Af Amer: >60 mL/min (ref >60)
GLUCOSE: 91 mg/dL (ref 65–99)
Potassium: 4.4 mmol/L (ref 3.5–5.1)
Sodium: 140 mmol/L (ref 135–145)

## 2015-04-03 MED ORDER — ONDANSETRON HCL 4 MG PO TABS: 4.0000 mg | ORAL_TABLET | Freq: Four times a day (QID) | ORAL | Status: DC | PRN

## 2015-04-03 MED ORDER — LEVOTHYROXINE SODIUM 25 MCG PO TABS
25.0000 ug | ORAL_TABLET | Freq: Every day | ORAL | Status: DC
  Administered 2015-04-04 – 2015-04-05 (×2): 25 ug via ORAL
  Filled 2015-04-03 (×3): qty 1

## 2015-04-03 MED ORDER — ONDANSETRON HCL 4 MG/2ML IJ SOLN: 4.0000 mg | Freq: Four times a day (QID) | INTRAMUSCULAR | Status: DC | PRN

## 2015-04-03 MED ORDER — POLYETHYLENE GLYCOL 3350 17 G PO PACK: 17.0000 g | PACK | Freq: Every day | ORAL | Status: DC | PRN

## 2015-04-03 MED ORDER — SODIUM CHLORIDE 0.9% FLUSH
3.0000 mL | Freq: Two times a day (BID) | INTRAVENOUS | Status: DC
Start: 2015-04-03 — End: 2015-04-06
  Administered 2015-04-03 – 2015-04-06 (×7): 3 mL via INTRAVENOUS

## 2015-04-03 MED ORDER — FOLIC ACID 1 MG PO TABS
500.0000 ug | ORAL_TABLET | Freq: Every day | ORAL | Status: DC
  Administered 2015-04-04 – 2015-04-06 (×3): 0.5 mg via ORAL
  Filled 2015-04-03 (×3): qty 1

## 2015-04-03 MED ORDER — IBUPROFEN 400 MG PO TABS
400.0000 mg | ORAL_TABLET | Freq: Four times a day (QID) | ORAL | Status: DC | PRN
  Administered 2015-04-03 (×2): 400 mg via ORAL
  Filled 2015-04-03 (×2): qty 1

## 2015-04-03 MED ORDER — VANCOMYCIN HCL IN DEXTROSE 750-5 MG/150ML-% IV SOLN
750.0000 mg | Freq: Two times a day (BID) | INTRAVENOUS | Status: DC
  Administered 2015-04-03 – 2015-04-05 (×4): 750 mg via INTRAVENOUS
  Filled 2015-04-03 (×5): qty 150

## 2015-04-03 MED ORDER — SODIUM CHLORIDE 0.9 % IV SOLN: 250.0000 mL | INTRAVENOUS | Status: DC | PRN

## 2015-04-03 MED ORDER — ACETAMINOPHEN 325 MG PO TABS
650.0000 mg | ORAL_TABLET | Freq: Four times a day (QID) | ORAL | Status: DC | PRN
  Administered 2015-04-05: 650 mg via ORAL
  Filled 2015-04-03: qty 2

## 2015-04-03 MED ORDER — VANCOMYCIN HCL IN DEXTROSE 1-5 GM/200ML-% IV SOLN
1000.0000 mg | Freq: Once | INTRAVENOUS | Status: AC
Start: 2015-04-03 — End: 2015-04-03
  Administered 2015-04-03: 1000 mg via INTRAVENOUS
  Filled 2015-04-03: qty 200

## 2015-04-03 MED ORDER — ACETAMINOPHEN 650 MG RE SUPP: 650.0000 mg | Freq: Four times a day (QID) | RECTAL | Status: DC | PRN

## 2015-04-03 MED ORDER — ROPINIROLE HCL 0.25 MG PO TABS
0.5000 mg | ORAL_TABLET | Freq: Every day | ORAL | Status: DC
  Administered 2015-04-04 – 2015-04-06 (×3): 0.5 mg via ORAL
  Filled 2015-04-03 (×4): qty 2

## 2015-04-03 MED ORDER — ASPIRIN EC 81 MG PO TBEC
81.0000 mg | DELAYED_RELEASE_TABLET | Freq: Every day | ORAL | Status: DC
  Administered 2015-04-04 – 2015-04-06 (×3): 81 mg via ORAL
  Filled 2015-04-03 (×3): qty 1

## 2015-04-03 MED ORDER — PANTOPRAZOLE SODIUM 40 MG PO TBEC
40.0000 mg | DELAYED_RELEASE_TABLET | Freq: Every day | ORAL | Status: DC
  Administered 2015-04-04 – 2015-04-06 (×3): 40 mg via ORAL
  Filled 2015-04-03 (×3): qty 1

## 2015-04-03 MED ORDER — ENOXAPARIN SODIUM 40 MG/0.4ML ~~LOC~~ SOLN
40.0000 mg | SUBCUTANEOUS | Status: DC
  Administered 2015-04-03 – 2015-04-05 (×3): 40 mg via SUBCUTANEOUS
  Filled 2015-04-03 (×2): qty 0.4

## 2015-04-03 MED ORDER — TRAMADOL HCL 50 MG PO TABS
50.0000 mg | ORAL_TABLET | Freq: Four times a day (QID) | ORAL | Status: DC | PRN
  Administered 2015-04-04: 50 mg via ORAL
  Filled 2015-04-03: qty 1

## 2015-04-03 MED ORDER — TERAZOSIN HCL 5 MG PO CAPS
5.0000 mg | ORAL_CAPSULE | Freq: Once | ORAL | Status: DC
  Filled 2015-04-03: qty 1

## 2015-04-03 MED ORDER — SODIUM CHLORIDE 0.9% FLUSH: 3.0000 mL | INTRAVENOUS | Status: DC | PRN

## 2015-04-03 NOTE — Consult Note (Signed)
Pharmacy Antibiotic Note  Ronald Huerta is a 81 y.o. male admitted on 04/03/2015 with cellulitis.  Pharmacy has been consulted for vancomycin dosing.  Plan: Vancomycin 750 IV every 12 hours.  Goal trough 10-15 mcg/mL.  Will start 8 hours after first 1g dose for stacked dosing. Ke=0.064 T1/2=10 Vd=50.68  Will measure trough prior to the 5th dose which will be about steady state 0305 0930   Height: 5\' 10"  (177.8 cm) Weight: 159 lb 9.6 oz (72.394 kg) IBW/kg (Calculated) : 73  Temp (24hrs), Avg:97.9 F (36.6 C), Min:97.9 F (36.6 C), Max:97.9 F (36.6 C)   Recent Labs Lab 04/03/15 1251  WBC 6.1  CREATININE 0.88    Estimated Creatinine Clearance: 70.8 mL/min (by C-G formula based on Cr of 0.88).    No Known Allergies  Antimicrobials this admission: vancomycin 3/3 >>    Dose adjustments this admission:  Microbiology results: 3/3 BCx: pending  UCx:    Sputum:    MRSA PCR:   Thank you for allowing pharmacy to be a part of this patient's care.  Olene FlossMelissa D Berwyn Bigley, Pharm.D Clinical Pharmacist   04/03/2015 2:01 PM
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
## 2015-04-03 NOTE — Progress Notes (Signed)
Patient alert and oriented.  VSS.  Deaf and able to use sign language.  Had sign language interpreter come to help with admission.  Patient here with cellulitis on left leg.  Reddened area is marked with sharpie marker.  Continue to assess if getting worse.  Receiving IV abx.    Some minimal pain in left foot, covered with ibuprofen.

## 2015-04-04 NOTE — Progress Notes (Signed)
[facility] - [place] at [facility]   PATIENT NAME: Ronald Huerta    MR#:  [id number]  DATE OF BIRTH:  [birth year]  SUBJECTIVE:  CHIEF COMPLAINT:  No chief complaint on file.  Communicated thru white board and patient writing on a paper. Good appetite. Still has burning pain. No dysuria/abd pain/vomiting/SOB.  REVIEW OF SYSTEMS:    Review of Systems  Constitutional: Negative for fever and chills.  HENT: Negative for sore throat.   Eyes: Negative for blurred vision, double vision and pain.  Respiratory: Negative for cough, hemoptysis, shortness of breath and wheezing.   Cardiovascular: Negative for chest pain, palpitations, orthopnea and leg swelling.  Gastrointestinal: Negative for heartburn, nausea, vomiting, abdominal pain, diarrhea and constipation.  Genitourinary: Negative for dysuria and hematuria.  Musculoskeletal: Negative for back pain and joint pain.  Skin: Positive for rash.  Neurological: Negative for sensory change, speech change, focal weakness and headaches.  Endo/Heme/Allergies: Does not bruise/bleed easily.  Psychiatric/Behavioral: Negative for depression. The patient is not nervous/anxious.     DRUG ALLERGIES:  No Known Allergies  VITALS:  Blood pressure 110/56, pulse 56, temperature 97.8 F (36.6 C), temperature source Oral, resp. rate 16, height  (1.778 m), weight 72.712 kg (160 lb 4.8 oz), SpO2 97 %.  PHYSICAL EXAMINATION:   Physical Exam  GENERAL:  81 y.o.-year-old patient lying in the bed with no acute distress. Deaf and Mute EYES: Pupils equal, round, reactive to light and accommodation. No scleral icterus. Extraocular muscles intact.  HEENT: Head atraumatic, normocephalic. Oropharynx and nasopharynx clear.  NECK:  Supple, no jugular venous distention. No thyroid enlargement, no tenderness.  LUNGS: Normal breath sounds bilaterally, no wheezing, rales, rhonchi. No use of accessory muscles of respiration.   CARDIOVASCULAR: S1, S2 normal. No murmurs, rubs, or gallops.  ABDOMEN: Soft, nontender, nondistended. Bowel sounds present. No organomegaly or mass.  EXTREMITIES: No cyanosis, clubbing or edema b/l.    NEUROLOGIC: Cranial nerves II through XII are intact. No focal Motor or sensory deficits b/l.   PSYCHIATRIC: The patient is alert and oriented x 3.  SKIN: Redness lateral ankle and foot. Tender. No discharge  LABORATORY PANEL:   CBC  Recent Labs Lab 04/03/15 1251  WBC 6.1  HGB 12.3*  HCT 35.8*  PLT 165   ------------------------------------------------------------------------------------------------------------------ Chemistries   Recent Labs Lab 04/03/15 1251  NA 140  K 4.4  CL 106  CO2 27  GLUCOSE 91  BUN 18  CREATININE 0.88  CALCIUM 9.1   ------------------------------------------------------------------------------------------------------------------  Cardiac Enzymes No results for input(s): TROPONINI in the last 168 hours. ------------------------------------------------------------------------------------------------------------------  RADIOLOGY:  No results found.   ASSESSMENT AND PLAN:   * Left lower extremity cellulitis, failed outpatient therapy - still no improvement today. Burning pain Patient was on clindamycin recently. On IV vancomycin. No discharge or swelling. Afebrile. May need another 1-2 days in hospital  * Hypothyroidism Continue levothyroxine at 25 MCG daily  * DVT prophylaxis with Lovenox  All the records are reviewed and case discussed with Care Management/Social Workerr. Management plans discussed with the patient, family and they are in agreement.  CODE STATUS: FULL  TOTAL TIME TAKING CARE OF THIS PATIENT: 30 minutes.   POSSIBLE D/C IN 1-2 DAYS, DEPENDING ON CLINICAL CONDITION.  Milagros Loll R M.D on 04/04/2015 at 9:21 AM  Between 7am to 6pm - Pager - [phone]  After 6pm go to [email or web address] - password [email or web address]  [facility]  [place] [facility]  Office  [phone]  CC: Primary care physician;  Barbette ReichmannHANDE,VISHWANATH, MD  Note: This dictation was prepared with Dragon dictation along with smaller phrase technology. Any transcriptional errors that result from this process are unintentional.

## 2015-04-05 LAB — VANCOMYCIN, TROUGH: Vancomycin Tr: 10 ug/mL (ref 10–20)

## 2015-04-05 MED ORDER — TRIAMCINOLONE ACETONIDE 0.1 % EX CREA
TOPICAL_CREAM | Freq: Two times a day (BID) | CUTANEOUS | Status: DC | PRN
  Administered 2015-04-05: 21:00:00 via TOPICAL
  Filled 2015-04-05: qty 15

## 2015-04-05 MED ORDER — VANCOMYCIN HCL IN DEXTROSE 1-5 GM/200ML-% IV SOLN
1000.0000 mg | Freq: Two times a day (BID) | INTRAVENOUS | Status: DC
  Administered 2015-04-05 – 2015-04-06 (×2): 1000 mg via INTRAVENOUS
  Filled 2015-04-05 (×3): qty 200

## 2015-04-05 MED ORDER — HYDROCERIN EX CREA
TOPICAL_CREAM | Freq: Two times a day (BID) | CUTANEOUS | Status: DC | PRN
  Filled 2015-04-05: qty 113

## 2015-04-05 MED ORDER — TRIAMCINOLONE 0.1 % CREAM:EUCERIN CREAM 1:1: TOPICAL_CREAM | Freq: Two times a day (BID) | CUTANEOUS | Status: DC | PRN

## 2015-04-05 NOTE — Consult Note (Signed)
Pharmacy Antibiotic Note  Ronald Huerta is a 81 y.o. male admitted on 04/03/2015 with cellulitis.  Pharmacy has been consulted for vancomycin dosing.  Plan:  3/5: Vancomycin trough level 10 mcg/ml. Will increase dose slightly to Vancomycin 1 g IV q12 hours. Trough level ordered to be drawn prior to the 1000 dose on 3/7.   Trough target 10-15 mcg/ml.    Height: 5\' 10"  (177.8 cm) Weight: 159 lb (72.122 kg) IBW/kg (Calculated) : 73  Temp (24hrs), Avg:97.9 F (36.6 C), Min:97.8 F (36.6 C), Max:98 F (36.7 C)   Recent Labs Lab 04/03/15 1251 04/05/15 0911  WBC 6.1  --   CREATININE 0.88  --   VANCOTROUGH  --  10    Estimated Creatinine Clearance: 70.6 mL/min (by C-G formula based on Cr of 0.88).    No Known Allergies  Antimicrobials this admission: vancomycin 3/3 >>    Dose adjustments this admission:  Microbiology results: 3/3 BCx: pending  UCx:    Sputum:    MRSA PCR:   Thank you for allowing pharmacy to be a part of this patient's care.  Olene FlossMelissa D Maccia, Pharm.D Clinical Pharmacist   04/05/2015 11:50 AM

## 2015-04-05 NOTE — Progress Notes (Signed)
Pt to d/c tomorrow AM. Daughter to be here at 10am along with sign language interpreter to meet with Dr. Elpidio AnisSudini and/or cover discharge instructions. Secretary instructed on paging for interpreter to be here by 10 am.

## 2015-04-05 NOTE — Progress Notes (Addendum)
[facility] [place] at [facility]   PATIENT NAME: Ronald CedarsFranklin Huerta    MR#:  [id number]  DATE OF BIRTH:  12/11/1934  SUBJECTIVE:  CHIEF COMPLAINT:  No chief complaint on file.  Communicated thru sign language interpreter Good appetite. Still has burning pain.  REVIEW OF SYSTEMS:    Review of Systems  Constitutional: Negative for fever and chills.  HENT: Negative for sore throat.   Eyes: Negative for blurred vision, double vision and pain.  Respiratory: Negative for cough, hemoptysis, shortness of breath and wheezing.   Cardiovascular: Negative for chest pain, palpitations, orthopnea and leg swelling.  Gastrointestinal: Negative for heartburn, nausea, vomiting, abdominal pain, diarrhea and constipation.  Genitourinary: Negative for dysuria and hematuria.  Musculoskeletal: Negative for back pain and joint pain.  Skin: Positive for rash.  Neurological: Negative for sensory change, speech change, focal weakness and headaches.  Endo/Heme/Allergies: Does not bruise/bleed easily.  Psychiatric/Behavioral: Negative for depression. The patient is not nervous/anxious.     DRUG ALLERGIES:  No Known Allergies  VITALS:  Blood pressure 99/42, pulse 55, temperature 97.9 F (36.6 C), temperature source Oral, resp. rate 16, height 5\' 10"  (1.778 m), weight 72.122 kg (159 lb), SpO2 97 %.  PHYSICAL EXAMINATION:   Physical Exam  GENERAL:  81 y.o.-year-old patient lying in the bed with no acute distress. Deaf and Mute EYES: Pupils equal, round, reactive to light and accommodation. No scleral icterus. Extraocular muscles intact.  HEENT: Head atraumatic, normocephalic. Oropharynx and nasopharynx clear.  NECK:  Supple, no jugular venous distention. No thyroid enlargement, no tenderness.  LUNGS: Normal breath sounds bilaterally, no wheezing, rales, rhonchi. No use of accessory muscles of respiration.  CARDIOVASCULAR: S1, S2 normal. No murmurs, rubs, or gallops.  ABDOMEN:  Soft, nontender, nondistended. Bowel sounds present. No organomegaly or mass.  EXTREMITIES: No cyanosis, clubbing or edema b/l.    NEUROLOGIC: Cranial nerves II through XII are intact. No focal Motor or sensory deficits b/l.   PSYCHIATRIC: The patient is alert and oriented x 3.  SKIN: Redness lateral ankle and foot. Tender. No discharge  LABORATORY PANEL:   CBC  Recent Labs Lab 04/03/15 1251  WBC 6.1  HGB 12.3*  HCT 35.8*  PLT 165   ------------------------------------------------------------------------------------------------------------------ Chemistries   Recent Labs Lab 04/03/15 1251  NA 140  K 4.4  CL 106  CO2 27  GLUCOSE 91  BUN 18  CREATININE 0.88  CALCIUM 9.1   ------------------------------------------------------------------------------------------------------------------  Cardiac Enzymes No results for input(s): TROPONINI in the last 168 hours. ------------------------------------------------------------------------------------------------------------------  RADIOLOGY:  No results found.   ASSESSMENT AND PLAN:   * Left lower extremity cellulitis, failed outpatient therapy - still no improvement today. Burning pain Patient was on clindamycin recently. On IV vancomycin. No discharge or swelling. Afebrile. Likely d/c in AM.  Sign language interpreter has been set up for 10 AM tomorrow when daughter will be with the patient.  * Hypothyroidism Continue levothyroxine at 25 MCG daily  * DVT prophylaxis with Lovenox  All the records are reviewed and case discussed with Care Management/Social Workerr. Management plans discussed with the patient, family and they are in agreement.  CODE STATUS: FULL  TOTAL TIME TAKING CARE OF THIS PATIENT: 30 minutes.   POSSIBLE D/C IN 1-2 DAYS, DEPENDING ON CLINICAL CONDITION.  Milagros LollSudini, Tinesha Siegrist R M.D on 04/05/2015 at 1:04 PM  Between 7am to 6pm - Pager - [phone]  After 6pm go to [email or web address] - password [email or web address]  [facility]  Office  [phone]  CC: Primary care physician; Barbette Reichmann, MD  Note: This dictation was prepared with Dragon dictation along with smaller phrase technology. Any transcriptional errors that result from this process are unintentional.

## 2015-04-05 NOTE — Care Management Important Message (Signed)
Important Message  Patient Details  Name: Ronald Huerta MRN: [id number] Date of Birth: Jan 30, 1935   Medicare Important Message Given:  Yes    Thelma Lorenzetti A, RN 04/05/2015, 4:32 PM

## 2015-04-06 MED ORDER — SULFAMETHOXAZOLE-TRIMETHOPRIM 800-160 MG PO TABS: 1.0000 | ORAL_TABLET | Freq: Two times a day (BID) | ORAL | Status: DC

## 2015-04-06 NOTE — Discharge Instructions (Signed)
Heart healthy diet. °Activity as tolerated. °

## 2015-04-06 NOTE — Plan of Care (Signed)
Problem: Physical Regulation: Goal: Ability to maintain clinical measurements within normal limits will improve Outcome: Completed/Met Date Met:  04/06/15 Patient ambulated around nursing station x 2 with nursing staff

## 2015-04-06 NOTE — Discharge Summary (Signed)
[facility] - [place] at [facility]   PATIENT NAME: Ronald Huerta    MR#:  [id number]  DATE OF BIRTH:  [birth year]  DATE OF ADMISSION:  04/03/2015 ADMITTING PHYSICIAN: Milagros Loll, MD  DATE OF DISCHARGE: 04/06/2015 12:24 PM  PRIMARY CARE PHYSICIAN: Barbette Reichmann, MD    ADMISSION DIAGNOSIS:  left leg cellulitis   DISCHARGE DIAGNOSIS:    SECONDARY DIAGNOSIS:   Past Medical History  Diagnosis Date  . Anxiety   . Restless leg syndrome   . Cellulitis of calf right  . Calculus, kidney   . HLD (hyperlipidemia)   . IBS (irritable bowel syndrome)   . BPH with obstruction/lower urinary tract symptoms   . Elevated PSA     HOSPITAL COURSE:   * Left lower extremity cellulitis, failed outpatient therapy. Patient was on clindamycin recently. He has been treated with IV vancomycin. No discharge or swelling. Erythema is much better. Change to po bactrim bid for 7 days.  I  discussed with patient and his daughter via Sign language interpreter.   * Hypothyroidism Continue levothyroxine at 25 MCG daily   DISCHARGE CONDITIONS:   Stable, discharged to home today.  CONSULTS OBTAINED:     DRUG ALLERGIES:  No Known Allergies  DISCHARGE MEDICATIONS:   Discharge Medication List as of 04/06/2015 10:35 AM    START taking these medications   Details  sulfamethoxazole-trimethoprim (BACTRIM DS,SEPTRA DS) 800-160 MG tablet Take 1 tablet by mouth 2 (two) times daily., Starting 04/06/2015, Until Discontinued, Print      CONTINUE these medications which have NOT CHANGED   Details  acetaminophen (TYLENOL) 325 MG tablet Take by mouth. Reported on 02/10/2015, Until Discontinued, Historical Med    aspirin EC 81 MG tablet Take by mouth., Until Discontinued, Historical Med    BIOTIN PO Take by mouth. Reported on 04/03/2015, Until Discontinued, Historical Med    Cyanocobalamin (RA VITAMIN B-12 TR) 1000 MCG TBCR Take by mouth. Reported on 04/03/2015, Until  Discontinued, Historical Med    folic acid (FOLVITE) 400 MCG tablet Take by mouth., Until Discontinued, Historical Med    levothyroxine (SYNTHROID, LEVOTHROID) 25 MCG tablet Take by mouth., Starting 10/07/2014, Until Wed 10/07/15, Historical Med    Multiple Vitamin (MULTI-VITAMINS) TABS Take by mouth., Until Discontinued, Historical Med    !! nystatin (MYCOSTATIN) powder Apply topically., Starting 10/07/2014, Until Wed 10/07/15, Historical Med    !! Nystatin [facility]) 100000 UNIT/GM POWD Reported on 02/10/2015, Starting 01/28/2015, Until Discontinued, Historical Med    omeprazole (PRILOSEC) 20 MG capsule Take 20 mg by mouth daily., Until Discontinued, Historical Med    rOPINIRole (REQUIP) 0.5 MG tablet Take by mouth., Starting 10/07/2014, Until Wed 10/07/15, Historical Med    Saw Palmetto 450 MG CAPS Until Discontinued, Historical Med    terazosin (HYTRIN) 5 MG capsule Take 1 capsule (5 mg total) by mouth once., Starting 02/10/2015, Normal    DHA-EPA-VITAMIN E PO Take by mouth. Reported on 04/03/2015, Until Discontinued, Historical Med    FERROUS SULFATE PO Take by mouth. Reported on 04/03/2015, Until Discontinued, Historical Med    traMADol (ULTRAM) 50 MG tablet Take 1 tablet (50 mg total) by mouth every 6 (six) hours as needed., Starting 01/07/2015, Until Thu 01/07/16, Print     !! - Potential duplicate medications found. Please discuss with provider.    STOP taking these medications     clindamycin (CLEOCIN) 300 MG capsule      fluconazole (DIFLUCAN) 100 MG tablet  DISCHARGE INSTRUCTIONS:    If you experience worsening of your admission symptoms, develop shortness of breath, life threatening emergency, suicidal or homicidal thoughts you must seek medical attention immediately by calling 911 or calling your MD immediately  if symptoms less severe.  You Must read complete instructions/literature along with all the possible adverse reactions/side effects for all the Medicines you take  and that have been prescribed to you. Take any new Medicines after you have completely understood and accept all the possible adverse reactions/side effects.   Please note  You were cared for by a hospitalist during your hospital stay. If you have any questions about your discharge medications or the care you received while you were in the hospital after you are discharged, you can call the unit and asked to speak with the hospitalist on call if the hospitalist that took care of you is not available. Once you are discharged, your primary care physician will handle any further medical issues. Please note that NO REFILLS for any discharge medications will be authorized once you are discharged, as it is imperative that you return to your primary care physician (or establish a relationship with a primary care physician if you do not have one) for your aftercare needs so that they can reassess your need for medications and monitor your lab values.    Today   SUBJECTIVE   No complaint.   VITAL SIGNS:  Blood pressure 121/59, pulse 56, temperature 97.6 F (36.4 C), temperature source Oral, resp. rate 18, height 5\' 10"  (1.778 m), weight 72.031 kg (158 lb 12.8 oz), SpO2 98 %.  I/O:   Intake/Output Summary (Last 24 hours) at 04/06/15 1638 Last data filed at 04/06/15 0900  Gross per 24 hour  Intake    603 ml  Output    700 ml  Net    -97 ml    PHYSICAL EXAMINATION:  GENERAL:  81 y.o.-year-old patient lying in the bed with no acute distress.  EYES: Pupils equal, round, reactive to light and accommodation. No scleral icterus. Extraocular muscles intact.  HEENT: Head atraumatic, normocephalic. Oropharynx and nasopharynx clear.  NECK:  Supple, no jugular venous distention. No thyroid enlargement, no tenderness.  LUNGS: Normal breath sounds bilaterally, no wheezing, rales,rhonchi or crepitation. No use of accessory muscles of respiration.  CARDIOVASCULAR: S1, S2 normal. No murmurs, rubs, or  gallops.  ABDOMEN: Soft, non-tender, non-distended. Bowel sounds present. No organomegaly or mass.  EXTREMITIES: No pedal edema, cyanosis, or clubbing. Much better erythema, no swelling or tenderness on left leg near ankle. NEUROLOGIC: Cranial nerves II through XII are intact. Muscle strength 5/5 in all extremities. Sensation intact. Gait not checked.  PSYCHIATRIC: The patient is alert and oriented x 3.  SKIN: No obvious rash, lesion, or ulcer.   DATA REVIEW:   CBC  Recent Labs Lab 04/03/15 1251  WBC 6.1  HGB 12.3*  HCT 35.8*  PLT 165    Chemistries   Recent Labs Lab 04/03/15 1251  NA 140  K 4.4  CL 106  CO2 27  GLUCOSE 91  BUN 18  CREATININE 0.88  CALCIUM 9.1    Cardiac Enzymes No results for input(s): TROPONINI in the last 168 hours.  Microbiology Results  Results for orders placed or performed in visit on 12/15/13  Culture, blood (single)     Status: None   Collection Time: 12/14/13  9:24 PM  Result Value Ref Range Status   Micro Text Report   Final  COMMENT                   NO GROWTH AEROBICALLY/ANAEROBICALLY IN 5 DAYS   ANTIBIOTIC                                                      Culture, blood (single)     Status: None   Collection Time: 12/14/13  9:32 PM  Result Value Ref Range Status   Micro Text Report   Final       COMMENT                   NO GROWTH AEROBICALLY/ANAEROBICALLY IN 5 DAYS   ANTIBIOTIC                                                        RADIOLOGY:  No results found.      Management plans discussed with the patient, family and they are in agreement.  CODE STATUS:  Code Status History    Date Active Date Inactive Code Status Order ID Comments User Context   04/03/2015 12:40 PM 04/06/2015  3:24 PM Full Code [id number]  Milagros Loll, MD Inpatient      TOTAL TIME TAKING CARE OF THIS PATIENT: 37 minutes.    Shaune Pollack M.D on 04/06/2015 at 4:38 PM  Between 7am to 6pm - Pager - [phone]  After 6pm go to  [email or web address] - password [email or web address] [email or web address]  [facility]  Office  [phone]  CC: Primary care physician; Barbette Reichmann, MD

## 2015-04-06 NOTE — Progress Notes (Signed)
DISCHARGE NOTE:  Interpreter at the bedside. Discharge instructions given to pt, and prescription. Pt wheeled to car.

## 2015-04-11 ENCOUNTER — Encounter: Payer: Self-pay | Admitting: Emergency Medicine

## 2015-04-11 ENCOUNTER — Inpatient Hospital Stay: Payer: Medicare Other

## 2015-04-11 ENCOUNTER — Emergency Department: Payer: Medicare Other

## 2015-04-11 ENCOUNTER — Inpatient Hospital Stay
Admission: EM | Admit: 2015-04-11 | Discharge: 2015-04-18 | DRG: 603 | Disposition: A | Discharge status: Home Health | Payer: Medicare Other | Attending: Internal Medicine | Admitting: Internal Medicine

## 2015-04-11 DIAGNOSIS — Z79891 Long term (current) use of opiate analgesic: Secondary | ICD-10-CM

## 2015-04-11 DIAGNOSIS — N401 Enlarged prostate with lower urinary tract symptoms: Secondary | ICD-10-CM | POA: Diagnosis present

## 2015-04-11 DIAGNOSIS — L089 Local infection of the skin and subcutaneous tissue, unspecified: Secondary | ICD-10-CM

## 2015-04-11 DIAGNOSIS — M549 Dorsalgia, unspecified: Secondary | ICD-10-CM | POA: Diagnosis present

## 2015-04-11 DIAGNOSIS — N138 Other obstructive and reflux uropathy: Secondary | ICD-10-CM | POA: Diagnosis present

## 2015-04-11 DIAGNOSIS — R112 Nausea with vomiting, unspecified: Secondary | ICD-10-CM | POA: Diagnosis present

## 2015-04-11 DIAGNOSIS — L0390 Cellulitis, unspecified: Secondary | ICD-10-CM | POA: Diagnosis present

## 2015-04-11 DIAGNOSIS — K589 Irritable bowel syndrome without diarrhea: Secondary | ICD-10-CM | POA: Diagnosis present

## 2015-04-11 DIAGNOSIS — E039 Hypothyroidism, unspecified: Secondary | ICD-10-CM | POA: Diagnosis present

## 2015-04-11 DIAGNOSIS — F419 Anxiety disorder, unspecified: Secondary | ICD-10-CM | POA: Diagnosis present

## 2015-04-11 DIAGNOSIS — R609 Edema, unspecified: Secondary | ICD-10-CM

## 2015-04-11 DIAGNOSIS — E785 Hyperlipidemia, unspecified: Secondary | ICD-10-CM | POA: Diagnosis present

## 2015-04-11 DIAGNOSIS — Z7982 Long term (current) use of aspirin: Secondary | ICD-10-CM | POA: Diagnosis not present

## 2015-04-11 DIAGNOSIS — G2581 Restless legs syndrome: Secondary | ICD-10-CM | POA: Diagnosis present

## 2015-04-11 DIAGNOSIS — R233 Spontaneous ecchymoses: Secondary | ICD-10-CM | POA: Diagnosis present

## 2015-04-11 DIAGNOSIS — M5416 Radiculopathy, lumbar region: Secondary | ICD-10-CM

## 2015-04-11 DIAGNOSIS — T370X5A Adverse effect of sulfonamides, initial encounter: Secondary | ICD-10-CM | POA: Diagnosis present

## 2015-04-11 DIAGNOSIS — Z87891 Personal history of nicotine dependence: Secondary | ICD-10-CM

## 2015-04-11 DIAGNOSIS — M79659 Pain in unspecified thigh: Secondary | ICD-10-CM

## 2015-04-11 DIAGNOSIS — Z79899 Other long term (current) drug therapy: Secondary | ICD-10-CM

## 2015-04-11 DIAGNOSIS — R7989 Other specified abnormal findings of blood chemistry: Secondary | ICD-10-CM | POA: Diagnosis present

## 2015-04-11 DIAGNOSIS — E871 Hypo-osmolality and hyponatremia: Secondary | ICD-10-CM | POA: Diagnosis present

## 2015-04-11 DIAGNOSIS — J45909 Unspecified asthma, uncomplicated: Secondary | ICD-10-CM | POA: Diagnosis present

## 2015-04-11 DIAGNOSIS — L03116 Cellulitis of left lower limb: Principal | ICD-10-CM | POA: Diagnosis present

## 2015-04-11 DIAGNOSIS — H9190 Unspecified hearing loss, unspecified ear: Secondary | ICD-10-CM | POA: Diagnosis present

## 2015-04-11 DIAGNOSIS — K7689 Other specified diseases of liver: Secondary | ICD-10-CM | POA: Diagnosis present

## 2015-04-11 DIAGNOSIS — R945 Abnormal results of liver function studies: Secondary | ICD-10-CM

## 2015-04-11 HISTORY — DX: Unspecified hearing loss, unspecified ear: H91.90

## 2015-04-11 LAB — PROTIME-INR
INR: 1.15
Prothrombin Time: 14.9 seconds (ref 11.4–15.0)

## 2015-04-11 LAB — CBC
HCT: 35.7 % — ABNORMAL LOW (ref 40.0–52.0)
Hemoglobin: 12.1 g/dL — ABNORMAL LOW (ref 13.0–18.0)
MCH: 32.3 pg (ref 26.0–34.0)
MCHC: 33.8 g/dL (ref 32.0–36.0)
MCV: 95.4 fL (ref 80.0–100.0)
PLATELETS: 148 10*3/uL — AB (ref 150–440)
RBC: 3.75 MIL/uL — AB (ref 4.40–5.90)
RDW: 14 % (ref 11.5–14.5)
WBC: 7.9 10*3/uL (ref 3.8–10.6)

## 2015-04-11 LAB — URINALYSIS COMPLETE WITH MICROSCOPIC (ARMC ONLY)
BILIRUBIN URINE: NEGATIVE
Bacteria, UA: NONE SEEN
Glucose, UA: NEGATIVE mg/dL
Hgb urine dipstick: NEGATIVE
NITRITE: NEGATIVE
PH: 5 (ref 5.0–8.0)
PROTEIN: NEGATIVE mg/dL
SPECIFIC GRAVITY, URINE: 1.017 (ref 1.005–1.030)
Squamous Epithelial / LPF: NONE SEEN

## 2015-04-11 LAB — COMPREHENSIVE METABOLIC PANEL
ALBUMIN: 3.7 g/dL (ref 3.5–5.0)
ALK PHOS: 354 U/L — AB (ref 38–126)
ALT: 327 U/L — ABNORMAL HIGH (ref 17–63)
AST: 316 U/L — AB (ref 15–41)
Anion gap: 5 (ref 5–15)
BILIRUBIN TOTAL: 1.2 mg/dL (ref 0.3–1.2)
BUN: 12 mg/dL (ref 6–20)
CALCIUM: 8.6 mg/dL — AB (ref 8.9–10.3)
CO2: 27 mmol/L (ref 22–32)
Chloride: 100 mmol/L — ABNORMAL LOW (ref 101–111)
Creatinine, Ser: 1.03 mg/dL (ref 0.61–1.24)
GFR calc Af Amer: >60 mL/min (ref >60)
GFR calc non Af Amer: >60 mL/min (ref >60)
GLUCOSE: 106 mg/dL — AB (ref 65–99)
POTASSIUM: 3.9 mmol/L (ref 3.5–5.1)
SODIUM: 132 mmol/L — AB (ref 135–145)
TOTAL PROTEIN: 7.1 g/dL (ref 6.5–8.1)

## 2015-04-11 LAB — APTT: APTT: 31 s (ref 24–36)

## 2015-04-11 LAB — INFLUENZA PANEL BY PCR (TYPE A & B)
H1N1 flu by pcr: NOT DETECTED
INFLBPCR: NEGATIVE
Influenza A By PCR: NEGATIVE

## 2015-04-11 LAB — LACTIC ACID, PLASMA: Lactic Acid, Venous: 0.9 mmol/L (ref 0.5–2.0)

## 2015-04-11 LAB — ACETAMINOPHEN LEVEL: Acetaminophen (Tylenol), Serum: <10 ug/mL — ABNORMAL LOW (ref 10–30)

## 2015-04-11 LAB — LIPASE, BLOOD: Lipase: 16 U/L (ref 11–51)

## 2015-04-11 LAB — TROPONIN I

## 2015-04-11 LAB — RAPID INFLUENZA A&B ANTIGENS
Influenza A (ARMC): NEGATIVE
Influenza B (ARMC): NEGATIVE

## 2015-04-11 MED ORDER — POLYETHYLENE GLYCOL 3350 17 G PO PACK: 17.0000 g | PACK | Freq: Every day | ORAL | Status: DC | PRN

## 2015-04-11 MED ORDER — ENOXAPARIN SODIUM 40 MG/0.4ML ~~LOC~~ SOLN
40.0000 mg | SUBCUTANEOUS | Status: DC
  Administered 2015-04-12 – 2015-04-17 (×6): 40 mg via SUBCUTANEOUS
  Filled 2015-04-11 (×7): qty 0.4

## 2015-04-11 MED ORDER — ACETAMINOPHEN 325 MG PO TABS
650.0000 mg | ORAL_TABLET | Freq: Four times a day (QID) | ORAL | Status: DC | PRN
  Administered 2015-04-12 – 2015-04-14 (×2): 650 mg via ORAL
  Filled 2015-04-11 (×2): qty 2

## 2015-04-11 MED ORDER — BISACODYL 5 MG PO TBEC
5.0000 mg | DELAYED_RELEASE_TABLET | Freq: Every day | ORAL | Status: DC | PRN
  Administered 2015-04-16 – 2015-04-17 (×2): 5 mg via ORAL
  Filled 2015-04-11 (×3): qty 1

## 2015-04-11 MED ORDER — ASPIRIN EC 81 MG PO TBEC
81.0000 mg | DELAYED_RELEASE_TABLET | Freq: Every day | ORAL | Status: DC
  Administered 2015-04-12 – 2015-04-18 (×7): 81 mg via ORAL
  Filled 2015-04-11 (×7): qty 1

## 2015-04-11 MED ORDER — PSYLLIUM 0.52 G PO CAPS: 0.5200 g | ORAL_CAPSULE | Freq: Every day | ORAL | Status: DC

## 2015-04-11 MED ORDER — ONDANSETRON HCL 4 MG/2ML IJ SOLN
4.0000 mg | Freq: Four times a day (QID) | INTRAMUSCULAR | Status: DC | PRN
  Administered 2015-04-12 – 2015-04-15 (×3): 4 mg via INTRAVENOUS
  Filled 2015-04-11 (×3): qty 2

## 2015-04-11 MED ORDER — TERAZOSIN HCL 5 MG PO CAPS
5.0000 mg | ORAL_CAPSULE | Freq: Every day | ORAL | Status: DC
  Administered 2015-04-13 – 2015-04-18 (×5): 5 mg via ORAL
  Filled 2015-04-11 (×7): qty 1

## 2015-04-11 MED ORDER — ROPINIROLE HCL 0.5 MG PO TABS
0.5000 mg | ORAL_TABLET | Freq: Every day | ORAL | Status: DC
  Filled 2015-04-11 (×2): qty 1

## 2015-04-11 MED ORDER — LEVOTHYROXINE SODIUM 25 MCG PO TABS
25.0000 ug | ORAL_TABLET | Freq: Every day | ORAL | Status: DC
  Administered 2015-04-13 – 2015-04-18 (×6): 25 ug via ORAL
  Filled 2015-04-11 (×6): qty 1

## 2015-04-11 MED ORDER — VANCOMYCIN HCL IN DEXTROSE 1-5 GM/200ML-% IV SOLN
1000.0000 mg | Freq: Once | INTRAVENOUS | Status: AC
  Administered 2015-04-11: 1000 mg via INTRAVENOUS
  Filled 2015-04-11: qty 200

## 2015-04-11 MED ORDER — ONDANSETRON HCL 4 MG/2ML IJ SOLN
4.0000 mg | Freq: Once | INTRAMUSCULAR | Status: AC
  Administered 2015-04-11: 4 mg via INTRAVENOUS
  Filled 2015-04-11: qty 2

## 2015-04-11 MED ORDER — ADULT MULTIVITAMIN W/MINERALS CH
1.0000 | ORAL_TABLET | Freq: Every day | ORAL | Status: DC
  Administered 2015-04-12 – 2015-04-18 (×7): 1 via ORAL
  Filled 2015-04-11 (×7): qty 1

## 2015-04-11 MED ORDER — SODIUM CHLORIDE 0.9 % IV BOLUS (SEPSIS): 1000.0000 mL | Freq: Once | INTRAVENOUS | Status: DC

## 2015-04-11 MED ORDER — ACETAMINOPHEN 325 MG PO TABS
650.0000 mg | ORAL_TABLET | Freq: Once | ORAL | Status: AC
  Administered 2015-04-11: 650 mg via ORAL
  Filled 2015-04-11: qty 2

## 2015-04-11 MED ORDER — PSYLLIUM 95 % PO PACK
1.0000 | PACK | Freq: Every day | ORAL | Status: DC
  Administered 2015-04-12 – 2015-04-18 (×7): 1 via ORAL
  Filled 2015-04-11 (×8): qty 1

## 2015-04-11 MED ORDER — HYDROCODONE-ACETAMINOPHEN 5-325 MG PO TABS
1.0000 | ORAL_TABLET | ORAL | Status: DC | PRN
  Administered 2015-04-11: 2 via ORAL
  Administered 2015-04-12 – 2015-04-15 (×6): 1 via ORAL
  Administered 2015-04-15: 2 via ORAL
  Administered 2015-04-15 – 2015-04-18 (×4): 1 via ORAL
  Filled 2015-04-11 (×2): qty 1
  Filled 2015-04-11: qty 2
  Filled 2015-04-11 (×3): qty 1
  Filled 2015-04-11: qty 2
  Filled 2015-04-11 (×2): qty 1
  Filled 2015-04-11: qty 2
  Filled 2015-04-11 (×2): qty 1

## 2015-04-11 MED ORDER — MORPHINE SULFATE (PF) 2 MG/ML IV SOLN
2.0000 mg | Freq: Once | INTRAVENOUS | Status: AC
  Administered 2015-04-11: 2 mg via INTRAVENOUS
  Filled 2015-04-11: qty 1

## 2015-04-11 MED ORDER — SODIUM CHLORIDE 0.9 % IV SOLN
1000.0000 mL | Freq: Once | INTRAVENOUS | Status: AC
  Administered 2015-04-11: 1000 mL via INTRAVENOUS

## 2015-04-11 MED ORDER — PANTOPRAZOLE SODIUM 40 MG PO TBEC
40.0000 mg | DELAYED_RELEASE_TABLET | Freq: Every day | ORAL | Status: DC
  Administered 2015-04-12 – 2015-04-13 (×2): 40 mg via ORAL
  Filled 2015-04-11 (×2): qty 1

## 2015-04-11 MED ORDER — ACETAMINOPHEN 650 MG RE SUPP: 650.0000 mg | Freq: Four times a day (QID) | RECTAL | Status: DC | PRN

## 2015-04-11 MED ORDER — VANCOMYCIN HCL 10 G IV SOLR
1250.0000 mg | INTRAVENOUS | Status: DC
  Administered 2015-04-11 – 2015-04-12 (×2): 1250 mg via INTRAVENOUS
  Filled 2015-04-11 (×3): qty 1250
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
## 2015-04-11 NOTE — ED Notes (Signed)
Patient assessed using video interpreter   Ronald Huerta [id number]

## 2015-04-11 NOTE — ED Notes (Signed)
Una boot on left leg removed per Dr. Cyril LoosenKinner verbal order, patient noted to have significant redness to the left lower leg, just above the ankle. Redness and swelling also noted. Per patient daughter, redness has extended up the leg from yesterday.

## 2015-04-11 NOTE — Progress Notes (Signed)
Pharmacy Antibiotic Note  Ronald Huerta is a 81 y.o. male admitted on 04/11/2015 with cellulitis.  Pharmacy has been consulted for vancomycin dosing. Patient has a chronic LLE ulcer and was recently treated with bactrim and cephalexin outpatient. He was recently admitted to hospital and responded to vancomycin. ID consult has been placed.   Plan: Vancomycin 1250 IV every 24 hours.  Goal trough 10-15 mcg/mL.   Kinetics: Ke: 0.054 Half-life: 12.8 hours Vd: 50.2 L  Cmin (calculated): ~ 13 mcg/mL  Weight: 158 lb (71.668 kg). Used actual body weight of 71.7 kg  Temp (24hrs), Avg:99.7 F (37.6 C), Min:99.2 F (37.3 C), Max:100.2 F (37.9 C)   Recent Labs Lab 04/05/15 0911 04/11/15 1145 04/11/15 1526  WBC  --  7.9  --   CREATININE  --  1.03  --   LATICACIDVEN  --   --  0.9  VANCOTROUGH 10  --   --     Estimated Creatinine Clearance: 59.9 mL/min (by C-G formula based on Cr of 1.03).    No Known Allergies  Antimicrobials this admission: vancomycin 3/11 >>   Dose adjustments this admission: n/a  Microbiology results: 3/11 BCx: Sent  Thank you for allowing pharmacy to be a part of this patient's care.  Cindi CarbonMary M Mannie Ohlin, PharmD Clinical Pharmacist 04/11/2015 6:00 PM

## 2015-04-11 NOTE — Progress Notes (Signed)
Pt actively vomiting no antiemetic med ordered. MD paged and returned call. MD to place orders

## 2015-04-11 NOTE — ED Notes (Signed)
Patient assessed using video interpreter   Ronald KaufmanMarvin [phone]

## 2015-04-11 NOTE — ED Notes (Signed)
Pt to ed with c/o vomiting since yesterday.  Pt is deaf and requesting an interpreter.
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
## 2015-04-11 NOTE — ED Notes (Signed)
Patient c/o vomiting that started this morning. Patient unable to keep medications and po fluids down. Patient is currently on antibiotics for cellulitis of the left leg.   Patient denies abd pain, diarrhea, chest pain, and cough.

## 2015-04-11 NOTE — ED Notes (Signed)
Report called to Tobi BastosAnna, Charity fundraiserN. Patient is currently off of the floor in ultrasound. Tobi Bastosnna informed we will transport patient to the floor when he is finished with ultrasound.

## 2015-04-11 NOTE — Progress Notes (Signed)
Interpreter at the bedside, Admission screening complete. Food tray ordered, legs elevated on 2 pillows. No c/o of pain at this time.

## 2015-04-12 LAB — CBC
HEMATOCRIT: 32.3 % — AB (ref 40.0–52.0)
HEMOGLOBIN: 11.1 g/dL — AB (ref 13.0–18.0)
MCH: 33 pg (ref 26.0–34.0)
MCHC: 34.5 g/dL (ref 32.0–36.0)
MCV: 95.5 fL (ref 80.0–100.0)
Platelets: 123 10*3/uL — ABNORMAL LOW (ref 150–440)
RBC: 3.38 MIL/uL — ABNORMAL LOW (ref 4.40–5.90)
RDW: 13.7 % (ref 11.5–14.5)
WBC: 10.7 10*3/uL — AB (ref 3.8–10.6)

## 2015-04-12 LAB — COMPREHENSIVE METABOLIC PANEL
ALBUMIN: 3 g/dL — AB (ref 3.5–5.0)
ALT: 280 U/L — ABNORMAL HIGH (ref 17–63)
AST: 247 U/L — AB (ref 15–41)
Alkaline Phosphatase: 372 U/L — ABNORMAL HIGH (ref 38–126)
Anion gap: 8 (ref 5–15)
BUN: 14 mg/dL (ref 6–20)
CHLORIDE: 98 mmol/L — AB (ref 101–111)
CO2: 26 mmol/L (ref 22–32)
Calcium: 8.1 mg/dL — ABNORMAL LOW (ref 8.9–10.3)
Creatinine, Ser: 0.99 mg/dL (ref 0.61–1.24)
GFR calc Af Amer: >60 mL/min (ref >60)
GFR calc non Af Amer: >60 mL/min (ref >60)
GLUCOSE: 103 mg/dL — AB (ref 65–99)
POTASSIUM: 4.1 mmol/L (ref 3.5–5.1)
Sodium: 132 mmol/L — ABNORMAL LOW (ref 135–145)
Total Bilirubin: 2.9 mg/dL — ABNORMAL HIGH (ref 0.3–1.2)
Total Protein: 6.2 g/dL — ABNORMAL LOW (ref 6.5–8.1)

## 2015-04-12 MED ORDER — ROPINIROLE HCL 0.25 MG PO TABS
0.7500 mg | ORAL_TABLET | Freq: Three times a day (TID) | ORAL | Status: DC
  Administered 2015-04-12 – 2015-04-18 (×17): 0.75 mg via ORAL
  Filled 2015-04-12 (×17): qty 3

## 2015-04-12 MED ORDER — SODIUM CHLORIDE 0.9 % IV SOLN
3.0000 g | Freq: Four times a day (QID) | INTRAVENOUS | Status: DC
  Administered 2015-04-12 – 2015-04-17 (×20): 3 g via INTRAVENOUS
  Filled 2015-04-12 (×24): qty 3

## 2015-04-12 NOTE — Progress Notes (Addendum)
Pharmacy Antibiotic Note  Ronald Huerta is a 81 y.o. male admitted on 04/11/2015 with cellulitis.  Pharmacy has been consulted for Unasyn dosing.  Plan: Start Unasyn 3 gm IV q6h based on renal function and per indication.   Height: 5\' 10"  (177.8 cm) Weight: 162 lb 3.2 oz (73.573 kg) IBW/kg (Calculated) : 73  Temp (24hrs), Avg:99.8 F (37.7 C), Min:99.2 F (37.3 C), Max:100.6 F (38.1 C)   Recent Labs Lab 04/11/15 1145 04/11/15 1526 04/12/15 0346  WBC 7.9  --  10.7*  CREATININE 1.03  --  0.99  LATICACIDVEN  --  0.9  --     Estimated Creatinine Clearance: 63.5 mL/min (by C-G formula based on Cr of 0.99).    No Known Allergies  Antimicrobials this admission: Anti-infectives    Start     Dose/Rate Route Frequency Ordered Stop   04/12/15 1200  Ampicillin-Sulbactam (UNASYN) 3 g in sodium chloride 0.9 % 100 mL IVPB     3 g 100 mL/hr over 60 Minutes Intravenous Every 6 hours 04/12/15 1158     04/11/15 2200  vancomycin (VANCOCIN) 1,250 mg in sodium chloride 0.9 % 250 mL IVPB     1,250 mg 166.7 mL/hr over 90 Minutes Intravenous Every 24 hours 04/11/15 1759     04/11/15 1530  vancomycin (VANCOCIN) IVPB 1000 mg/200 mL premix     1,000 mg 200 mL/hr over 60 Minutes Intravenous  Once 04/11/15 1523 04/11/15 1721       Microbiology results: Results for orders placed or performed during the hospital encounter of 04/11/15  Blood culture (routine x 2)     Status: None (Preliminary result)   Collection Time: 04/11/15  3:25 PM  Result Value Ref Range Status   Specimen Description BLOOD LEFT ARM  Final   Special Requests BOTTLES DRAWN AEROBIC AND ANAEROBIC 7CC  Final   Culture NO GROWTH < 24 HOURS  Final   Report Status PENDING  Incomplete  Rapid Influenza A&B Antigens ([facility] only)     Status: None   Collection Time: 04/11/15  3:26 PM  Result Value Ref Range Status   Influenza A ([facility]) NEGATIVE NEGATIVE Final   Influenza B ([facility]) NEGATIVE NEGATIVE Final  Blood culture  (routine x 2)     Status: None (Preliminary result)   Collection Time: 04/11/15  3:30 PM  Result Value Ref Range Status   Specimen Description BLOOD RIGHT ARM  Final   Special Requests BOTTLES DRAWN AEROBIC AND ANAEROBIC 7CC  Final   Culture NO GROWTH < 24 HOURS  Final   Report Status PENDING  Incomplete    Thank you for allowing pharmacy to be a part of this patient's care.  3/13:  Vanc trough on 3/13 @ 21:30 = 5 mcg/mL           Will increase dose to Vancomycin 1250 mg IV Q12H and recheck vanc trough before 3rd new dose on 3/14            @ 22:00.   Scarpena,Crystal G 04/12/2015 12:42 PM

## 2015-04-12 NOTE — Progress Notes (Signed)
[place] at [facility]                                                                                                                                                                                            Patient Demographics   Ronald Huerta, is a 81 y.o. male, DOB - 07-31-1934, WYO:[id number]  Admit date - 04/11/2015   Admitting Physician Hillary Bow, MD  Outpatient Primary MD for the patient is Tracie Harrier, MD   LOS - 1  Subjective: Patient has progression of left lower extremity cellulitis. His erythema has gotten worse. His right lower extremity has a petechia as well. Patient complains of pain in multiple joints including his hip   Review of Systems:   CONSTITUTIONAL: No documented fever. No fatigue, weakness. No weight gain, no weight loss.  EYES: No blurry or double vision.  ENT: No tinnitus. No postnasal drip. No redness of the oropharynx.  RESPIRATORY: No cough, no wheeze, no hemoptysis. No dyspnea.  CARDIOVASCULAR: No chest pain. No orthopnea. No palpitations. No syncope.  GASTROINTESTINAL: No nausea, no vomiting or diarrhea. No abdominal pain. No melena or hematochezia.  GENITOURINARY: No dysuria or hematuria.  ENDOCRINE: No polyuria or nocturia. No heat or cold intolerance.  HEMATOLOGY: No anemia. No bruising. No bleeding.  INTEGUMENTARY: Rash and erythema involving his leg MUSCULOSKELETAL: Positive hip pain NEUROLOGIC: No numbness, tingling, or ataxia. No seizure-type activity.  PSYCHIATRIC: No anxiety. No insomnia. No ADD.    Vitals:   Filed Vitals:   04/11/15 2028 04/12/15 0430 04/12/15 0637 04/12/15 0755  BP: 123/47 130/53  114/44  Pulse: 88 99  84  Temp: 99.4 F (37.4 C) 99.7 F (37.6 C)  99.2 F (37.3 C)  TempSrc: Oral Oral  Oral  Resp: '18 20  17  ' Height:      Weight:   73.573 kg (162 lb 3.2 oz)   SpO2: 91% 90%  90%    Wt Readings from Last 3 Encounters:  04/12/15 73.573 kg (162 lb 3.2 oz)  04/06/15  72.031 kg (158 lb 12.8 oz)  02/10/15 75.025 kg (165 lb 6.4 oz)     Intake/Output Summary (Last 24 hours) at 04/12/15 1125 Last data filed at 04/12/15 0942  Gross per 24 hour  Intake    490 ml  Output    300 ml  Net    190 ml    Physical Exam:   GENERAL: Pleasant-appearing in no apparent distress.  HEAD, EYES, EARS, NOSE AND THROAT: Atraumatic, normocephalic. Extraocular muscles are intact. Pupils equal and reactive to light. Sclerae anicteric. No conjunctival injection. No oro-pharyngeal erythema.  NECK: Supple. There is no jugular venous distention. No bruits, no lymphadenopathy, no thyromegaly.  HEART: Regular rate and rhythm,. No murmurs, no rubs, no clicks.  LUNGS: Clear to auscultation bilaterally. No rales or rhonchi. No wheezes.  ABDOMEN: Soft, flat, nontender, nondistended. Has good bowel sounds. No hepatosplenomegaly appreciated.  EXTREMITIES: No evidence of any cyanosis, clubbing, or peripheral edema.  +2 pedal and radial pulses bilaterally.  NEUROLOGIC: The patient is alert, awake, and oriented x3 with no focal motor or sensory deficits appreciated bilaterally.  SKIN: Right lower extremity erythema extending from the ankle up worse to his knee. Also has petechial rash in both of his extremities Psych: Not anxious, depressed LN: No inguinal LN enlargement    Antibiotics   Anti-infectives    Start     Dose/Rate Route Frequency Ordered Stop   04/11/15 2200  vancomycin (VANCOCIN) 1,250 mg in sodium chloride 0.9 % 250 mL IVPB     1,250 mg 166.7 mL/hr over 90 Minutes Intravenous Every 24 hours 04/11/15 1759     04/11/15 1530  vancomycin (VANCOCIN) IVPB 1000 mg/200 mL premix     1,000 mg 200 mL/hr over 60 Minutes Intravenous  Once 04/11/15 1523 04/11/15 1721      Medications   Scheduled Meds: . aspirin EC  81 mg Oral Daily  . enoxaparin (LOVENOX) injection  40 mg Subcutaneous Q24H  . levothyroxine  25 mcg Oral Q0600  . multivitamin with minerals  1 tablet Oral Daily   . pantoprazole  40 mg Oral Daily  . psyllium  1 packet Oral Daily  . rOPINIRole  0.75 mg Oral TID  . sodium chloride  1,000 mL Intravenous Once  . terazosin  5 mg Oral Daily  . vancomycin  1,250 mg Intravenous Q24H   Continuous Infusions:  PRN Meds:.acetaminophen **OR** acetaminophen, bisacodyl, HYDROcodone-acetaminophen, ondansetron (ZOFRAN) IV, polyethylene glycol   Data Review:   Micro Results Recent Results (from the past 240 hour(s))  Rapid Influenza A&B Antigens ([facility] only)     Status: None   Collection Time: 04/11/15  3:26 PM  Result Value Ref Range Status   Influenza A ([place]) NEGATIVE NEGATIVE Final   Influenza B ([facility]) NEGATIVE NEGATIVE Final    Radiology Reports US Venous Img Lower Unilateral Left  03/24/2015  CLINICAL DATA:  Left leg swelling for 3 weeks EXAM: Left LOWER EXTREMITY VENOUS DOPPLER ULTRASOUND TECHNIQUE: Gray-scale sonography with graded compression, as well as color Doppler and duplex ultrasound were performed to evaluate the lower extremity deep venous systems from the level of the common femoral vein and including the common femoral, femoral, profunda femoral, popliteal and calf veins including the posterior tibial, peroneal and gastrocnemius veins when visible. The superficial great saphenous vein was also interrogated. Spectral Doppler was utilized to evaluate flow at rest and with distal augmentation maneuvers in the common femoral, femoral and popliteal veins. COMPARISON:  None. FINDINGS: Contralateral Common Femoral Vein: Respiratory phasicity is normal and symmetric with the symptomatic side. No evidence of thrombus. Normal compressibility. Common Femoral Vein: No evidence of thrombus. Normal compressibility, respiratory phasicity and response to augmentation. Saphenofemoral Junction: No evidence of thrombus. Normal compressibility and flow on color Doppler imaging. Profunda Femoral Vein: No evidence of thrombus. Normal compressibility and flow on color  Doppler imaging. Femoral Vein: No evidence of thrombus. Normal compressibility, respiratory phasicity and response to augmentation. Popliteal Vein: No evidence of thrombus. Normal compressibility, respiratory phasicity and response to augmentation. Calf Veins: No evidence of thrombus. Normal compressibility and flow on color  Doppler imaging. Superficial Great Saphenous Vein: No evidence of thrombus. Normal compressibility and flow on color Doppler imaging. Venous Reflux:  None. Other Findings: There is enlarged lymph node in left inguinal region measures 3.2 x 2.4 cm probable reactive. The technologist reports left left ankle swelling and redness and skin wound. Clinical correlation is necessary. IMPRESSION: No evidence of deep venous thrombosis left lower extremity.There is enlarged lymph node in left inguinal region measures 3.2 x 2.4 cm probable reactive. The technologist reports left left ankle swelling and redness and skin wound. Clinical correlation is necessary. Electronically Signed   By: Lahoma Crocker M.D.   On: 03/24/2015 15:16   Dg Chest Portable 1 View  04/11/2015  CLINICAL DATA:  81 year old male with history of vomiting since this morning. Currently on antibiotics for cellulitis of the left leg. No current abdominal pain, diarrhea, chest pain or cough. EXAM: PORTABLE CHEST 1 VIEW COMPARISON:  Chest CT 04/09/2008. FINDINGS: Mild elevation of the right hemidiaphragm is unchanged. Lung volumes are normal. No consolidative airspace disease. No pleural effusions. No pneumothorax. No pulmonary nodule or mass noted. Pulmonary vasculature and the cardiomediastinal silhouette are within normal limits. IMPRESSION: 1. No radiographic evidence of acute cardiopulmonary disease. Electronically Signed   By: Vinnie Langton M.D.   On: 04/11/2015 15:42   Dg Femur Min 2 Views Left  04/11/2015  CLINICAL DATA:  Cellulitis and chronic left lower leg ulcer. Pain all over mid thigh. EXAM: LEFT FEMUR 2 VIEWS COMPARISON:   None. FINDINGS: Posttraumatic deformity of the femoral diaphysis is evident. Overlying soft tissues are unremarkable. Advanced degenerative changes are seen at the knee. IMPRESSION: Posttraumatic deformity mid femur with advanced osteoarthritis of the knee. Electronically Signed   By: Misty Stanley M.D.   On: 04/11/2015 20:30   US Abdomen Limited Ruq  04/11/2015  CLINICAL DATA:  Liver function abnormality. Elevated AST, ALT and alk-phos. Nausea and vomiting. EXAM: US ABDOMEN LIMITED - RIGHT UPPER QUADRANT COMPARISON:  CT 03/02/2010 FINDINGS: Gallbladder: Surgically absent. Common bile duct: Diameter: 6 mm, normal. Liver: No focal lesion identified. Within normal limits in parenchymal echogenicity. Mild central intrahepatic biliary ductal dilatation. IMPRESSION: 1. Postcholecystectomy. 2. Mild intrahepatic biliary ductal dilatation with normal caliber common bile duct, likely sequela of prior cholecystectomy. No sonographic evidence of central obstructing mass. 3. Otherwise normal sonographic appearance of the liver. Electronically Signed   By: Jeb Levering M.D.   On: 04/11/2015 18:01     CBC  Recent Labs Lab 04/11/15 1145 04/12/15 0346  WBC 7.9 10.7*  HGB 12.1* 11.1*  HCT 35.7* 32.3*  PLT 148* 123*  MCV 95.4 95.5  MCH 32.3 33.0  MCHC 33.8 34.5  RDW 14.0 13.7    Chemistries   Recent Labs Lab 04/11/15 1145 04/12/15 0346  NA 132* 132*  K 3.9 4.1  CL 100* 98*  CO2 27 26  GLUCOSE 106* 103*  BUN 12 14  CREATININE 1.03 0.99  CALCIUM 8.6* 8.1*  AST 316* 247*  ALT 327* 280*  ALKPHOS 354* 372*  BILITOT 1.2 2.9*   ------------------------------------------------------------------------------------------------------------------ estimated creatinine clearance is 63.5 mL/min (by C-G formula based on Cr of 0.99). ------------------------------------------------------------------------------------------------------------------ No results for input(s): HGBA1C in the last 72  hours. ------------------------------------------------------------------------------------------------------------------ No results for input(s): CHOL, HDL, LDLCALC, TRIG, CHOLHDL, LDLDIRECT in the last 72 hours. ------------------------------------------------------------------------------------------------------------------ No results for input(s): TSH, T4TOTAL, T3FREE, THYROIDAB in the last 72 hours.  Invalid input(s): FREET3 ------------------------------------------------------------------------------------------------------------------ No results for input(s): VITAMINB12, FOLATE, FERRITIN, TIBC, IRON, RETICCTPCT in the last 72  hours.  Coagulation profile  Recent Labs Lab 04/11/15 1726  INR 1.15    No results for input(s): DDIMER in the last 72 hours.  Cardiac Enzymes  Recent Labs Lab 04/11/15 1145  TROPONINI <0.03   ------------------------------------------------------------------------------------------------------------------ Invalid input(s): POCBNP    Assessment & Plan  Patient is a 81 year old white male with recurrent lower extremity cellulitis   1. Left leg cellulitis, failed outpatient therapy with Bactrim and Keflex. He was also tried on clindamycin before his recent admission. I will add Unasyn to his current vancomycin therapy. ID consult was pending. Recurrent cellulitis L check immunoglobulin levels. He'll need outpatient vascular evaluation 2. Petechial rash: Could be related to cellulitis but distal emboli is in the differential. At this time will monitor. His platelet count is normal  3.  Elevated liver function tests Patient had elevation in 2015 now worse., Hepatitis panel and autoimmune hepatitis workup is ordered. Ultrasound of the abdomen is negative  4. Restless leg syndrome daughter requesting increase in the dose I will increase the dose.  5. Hypothyroidism continue Synthroid  * DVT prophylaxis with Lovenox  All the records are reviewed  and case discussed with ED provider. Management plans discussed with the patient, family and they are in agreement.      Code Status Orders        Start     Ordered   04/11/15 1702  Full code   Continuous     04/11/15 1702    Code Status History    Date Active Date Inactive Code Status Order ID Comments User Context   04/03/2015 12:40 PM 04/06/2015  3:24 PM Full Code [id number]  Hillary Bow, MD Inpatient           Consults ID  DVT Prophylaxis  Lovenox  Lab Results  Component Value Date   PLT 123* 04/12/2015     Time Spent in minutes   60mn  PDustin FlockM.D on 04/12/2015 at 11:25 AM  Between 7am to 6pm - Pager - [phone]  After 6pm go to [email or web address] - password [email or web address]   Office  [phone]

## 2015-04-12 NOTE — Care Management Important Message (Signed)
Important Message  Patient Details  Name: Ronald CabalFranklin Delano Bartolomei MRN: [id number] Date of Birth: [birth year]   Medicare Important Message Given:  Yes    Jahshua Bonito A, RN 04/12/2015, 4:11 PM

## 2015-04-13 LAB — CBC
HCT: 30.9 % — ABNORMAL LOW (ref 40.0–52.0)
Hemoglobin: 10.8 g/dL — ABNORMAL LOW (ref 13.0–18.0)
MCH: 32.9 pg (ref 26.0–34.0)
MCHC: 34.9 g/dL (ref 32.0–36.0)
MCV: 94.2 fL (ref 80.0–100.0)
PLATELETS: 127 10*3/uL — AB (ref 150–440)
RBC: 3.28 MIL/uL — AB (ref 4.40–5.90)
RDW: 14 % (ref 11.5–14.5)
WBC: 11.5 10*3/uL — ABNORMAL HIGH (ref 3.8–10.6)

## 2015-04-13 LAB — ANA COMPREHENSIVE PANEL
CENTROMERE AB SCREEN: 0.3 AI (ref 0.0–0.9)
DS DNA AB: 4 [IU]/mL (ref 0–9)
ENA SM Ab Ser-aCnc: <0.2 AI (ref 0.0–0.9)
Ribonucleic Protein: 0.2 AI (ref 0.0–0.9)
SSA (Ro) (ENA) Antibody, IgG: <0.2 AI (ref 0.0–0.9)
SSB (La) (ENA) Antibody, IgG: <0.2 AI (ref 0.0–0.9)

## 2015-04-13 LAB — COMPREHENSIVE METABOLIC PANEL
ALT: 181 U/L — ABNORMAL HIGH (ref 17–63)
ANION GAP: 4 — AB (ref 5–15)
AST: 106 U/L — ABNORMAL HIGH (ref 15–41)
Albumin: 2.8 g/dL — ABNORMAL LOW (ref 3.5–5.0)
Alkaline Phosphatase: 296 U/L — ABNORMAL HIGH (ref 38–126)
BUN: 14 mg/dL (ref 6–20)
CHLORIDE: 101 mmol/L (ref 101–111)
CO2: 30 mmol/L (ref 22–32)
CREATININE: 0.88 mg/dL (ref 0.61–1.24)
Calcium: 8.3 mg/dL — ABNORMAL LOW (ref 8.9–10.3)
Glucose, Bld: 110 mg/dL — ABNORMAL HIGH (ref 65–99)
POTASSIUM: 3.8 mmol/L (ref 3.5–5.1)
SODIUM: 135 mmol/L (ref 135–145)
Total Bilirubin: 1.4 mg/dL — ABNORMAL HIGH (ref 0.3–1.2)
Total Protein: 6 g/dL — ABNORMAL LOW (ref 6.5–8.1)

## 2015-04-13 LAB — HEPATITIS PANEL, ACUTE
HEP B C IGM: NEGATIVE
HEP B S AG: NEGATIVE
Hep A IgM: NEGATIVE

## 2015-04-13 LAB — HEPATITIS B SURFACE ANTIGEN: Hepatitis B Surface Ag: NEGATIVE

## 2015-04-13 LAB — VANCOMYCIN, TROUGH: VANCOMYCIN TR: 5 ug/mL — AB (ref 10–20)

## 2015-04-13 LAB — ANTI-SMOOTH MUSCLE ANTIBODY, IGG: F-ACTIN AB IGG: 22 U — AB (ref 0–19)

## 2015-04-13 LAB — IGG, IGA, IGM
IGA: 292 mg/dL (ref 61–437)
IGG (IMMUNOGLOBIN G), SERUM: 947 mg/dL (ref 700–1600)
IgM, Serum: 34 mg/dL (ref 15–143)

## 2015-04-13 LAB — HEPATITIS B SURFACE ANTIBODY, QUANTITATIVE

## 2015-04-13 LAB — HEPATITIS C ANTIBODY

## 2015-04-13 LAB — HEPATITIS A ANTIBODY, TOTAL: Hep A Total Ab: POSITIVE — AB

## 2015-04-13 LAB — HEPATITIS B CORE ANTIBODY, TOTAL: Hep B Core Total Ab: POSITIVE — AB

## 2015-04-13 MED ORDER — PANTOPRAZOLE SODIUM 40 MG PO TBEC
40.0000 mg | DELAYED_RELEASE_TABLET | Freq: Two times a day (BID) | ORAL | Status: DC
  Administered 2015-04-13 – 2015-04-18 (×9): 40 mg via ORAL
  Filled 2015-04-13 (×9): qty 1

## 2015-04-13 MED ORDER — ENSURE ENLIVE PO LIQD
237.0000 mL | Freq: Two times a day (BID) | ORAL | Status: DC
  Administered 2015-04-14 – 2015-04-18 (×8): 237 mL via ORAL

## 2015-04-13 MED ORDER — VANCOMYCIN HCL 10 G IV SOLR
1250.0000 mg | Freq: Two times a day (BID) | INTRAVENOUS | Status: DC
  Administered 2015-04-13 – 2015-04-15 (×5): 1250 mg via INTRAVENOUS
  Filled 2015-04-13 (×6): qty 1250

## 2015-04-13 MED ORDER — CARISOPRODOL 350 MG PO TABS
350.0000 mg | ORAL_TABLET | Freq: Three times a day (TID) | ORAL | Status: DC
  Administered 2015-04-13 – 2015-04-18 (×14): 350 mg via ORAL
  Filled 2015-04-13 (×14): qty 1

## 2015-04-13 MED ORDER — KETOROLAC TROMETHAMINE 15 MG/ML IJ SOLN
15.0000 mg | Freq: Three times a day (TID) | INTRAMUSCULAR | Status: AC
  Administered 2015-04-13 – 2015-04-18 (×13): 15 mg via INTRAVENOUS
  Filled 2015-04-13 (×13): qty 1
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
## 2015-04-13 NOTE — Progress Notes (Signed)
[place] at [facility]                                                                                                                                                                                            Patient Demographics   Elisha Cooksey, is a 81 y.o. male, DOB - 11-22-1934, UXN:[id number]  Admit date - 04/11/2015   Admitting Physician Hillary Bow, MD  Outpatient Primary MD for the patient is Tracie Harrier, MD   LOS - 2  Subjective: Subjective: Patient is complaining of severe back pain on the right side as well as neck pain. His legs are improved. According to daughter at bedside patient was confused last night. Please note a sign language interpreter was used     Review of Systems:   CONSTITUTIONAL: No documented fever. No fatigue, weakness. No weight gain, no weight loss.  EYES: No blurry or double vision.  ENT: No tinnitus. No postnasal drip. No redness of the oropharynx.  RESPIRATORY: No cough, no wheeze, no hemoptysis. No dyspnea.  CARDIOVASCULAR: No chest pain. No orthopnea. No palpitations. No syncope.  GASTROINTESTINAL: No nausea, no vomiting or diarrhea. No abdominal pain. No melena or hematochezia.  GENITOURINARY: No dysuria or hematuria.  ENDOCRINE: No polyuria or nocturia. No heat or cold intolerance.  HEMATOLOGY: No anemia. No bruising. No bleeding.  INTEGUMENTARY: Rash and erythema involving his leg MUSCULOSKELETAL: Positive hip pain, back pain NEUROLOGIC: No numbness, tingling, or ataxia. No seizure-type activity.  PSYCHIATRIC: No anxiety. No insomnia. No ADD.    Vitals:   Filed Vitals:   04/12/15 1547 04/12/15 2006 04/13/15 0436 04/13/15 0756  BP: 111/51 100/48 115/50 123/54  Pulse: 71 73 65 70  Temp: 98.2 F (36.8 C) 99.4 F (37.4 C) 97.9 F (36.6 C) 98 F (36.7 C)  TempSrc: Oral Oral Oral Oral  Resp: _0 Height:      Weight:   73.301 kg (161 lb 9.6 oz)   SpO2: 93% 96% 95% 94%    Wt  Readings from Last 3 Encounters:  04/13/15 73.301 kg (161 lb 9.6 oz)  04/06/15 72.031 kg (158 lb 12.8 oz)  02/10/15 75.025 kg (165 lb 6.4 oz)     Intake/Output Summary (Last 24 hours) at 04/13/15 1241 Last data filed at 04/13/15 1002  Gross per 24 hour  Intake    340 ml  Output   1300 ml  Net   -960 ml    Physical Exam:   GENERAL: Pleasant-appearing in no apparent distress.  HEAD, EYES, EARS, NOSE AND THROAT: Atraumatic, normocephalic. Extraocular muscles are intact. Pupils equal and  reactive to light. Sclerae anicteric. No conjunctival injection. No oro-pharyngeal erythema.  NECK: Supple. There is no jugular venous distention. No bruits, no lymphadenopathy, no thyromegaly.  HEART: Regular rate and rhythm,. No murmurs, no rubs, no clicks.  LUNGS: Clear to auscultation bilaterally. No rales or rhonchi. No wheezes.  ABDOMEN: Soft, flat, nontender, nondistended. Has good bowel sounds. No hepatosplenomegaly appreciated.  EXTREMITIES: No evidence of any cyanosis, clubbing, or peripheral edema.  +2 pedal and radial pulses bilaterally.  NEUROLOGIC: The patient is alert, awake, and oriented x3 with no focal motor or sensory deficits appreciated bilaterally.  SKIN: Right lower extremity erythema improved , petechial rash in both of his lower extremities improved Psych: Not anxious, depressed LN: No inguinal LN enlargement    Antibiotics   Anti-infectives    Start     Dose/Rate Route Frequency Ordered Stop   04/12/15 1200  Ampicillin-Sulbactam (UNASYN) 3 g in sodium chloride 0.9 % 100 mL IVPB     3 g 100 mL/hr over 60 Minutes Intravenous Every 6 hours 04/12/15 1158     04/11/15 2200  vancomycin (VANCOCIN) 1,250 mg in sodium chloride 0.9 % 250 mL IVPB     1,250 mg 166.7 mL/hr over 90 Minutes Intravenous Every 24 hours 04/11/15 1759     04/11/15 1530  vancomycin (VANCOCIN) IVPB 1000 mg/200 mL premix     1,000 mg 200 mL/hr over 60 Minutes Intravenous  Once 04/11/15 1523 04/11/15 1721       Medications   Scheduled Meds: . ampicillin-sulbactam (UNASYN) IV  3 g Intravenous Q6H  . aspirin EC  81 mg Oral Daily  . carisoprodol  350 mg Oral TID  . enoxaparin (LOVENOX) injection  40 mg Subcutaneous Q24H  . ketorolac  15 mg Intravenous 3 times per day  . levothyroxine  25 mcg Oral Q0600  . multivitamin with minerals  1 tablet Oral Daily  . pantoprazole  40 mg Oral Daily  . psyllium  1 packet Oral Daily  . rOPINIRole  0.75 mg Oral TID  . sodium chloride  1,000 mL Intravenous Once  . terazosin  5 mg Oral Daily  . vancomycin  1,250 mg Intravenous Q24H   Continuous Infusions:  PRN Meds:.acetaminophen **OR** acetaminophen, bisacodyl, HYDROcodone-acetaminophen, ondansetron (ZOFRAN) IV, polyethylene glycol   Data Review:   Micro Results Recent Results (from the past 240 hour(s))  Blood culture (routine x 2)     Status: None (Preliminary result)   Collection Time: 04/11/15  3:25 PM  Result Value Ref Range Status   Specimen Description BLOOD LEFT ARM  Final   Special Requests BOTTLES DRAWN AEROBIC AND ANAEROBIC [place]  Final   Culture NO GROWTH 2 DAYS  Final   Report Status PENDING  Incomplete  Rapid Influenza A&B Antigens ([place] only)     Status: None   Collection Time: 04/11/15  3:26 PM  Result Value Ref Range Status   Influenza A ([place]) NEGATIVE NEGATIVE Final   Influenza B ([facility]) NEGATIVE NEGATIVE Final  Blood culture (routine x 2)     Status: None (Preliminary result)   Collection Time: 04/11/15  3:30 PM  Result Value Ref Range Status   Specimen Description BLOOD RIGHT ARM  Final   Special Requests BOTTLES DRAWN AEROBIC AND ANAEROBIC [place]  Final   Culture NO GROWTH 2 DAYS  Final   Report Status PENDING  Incomplete  Urine culture     Status: None (Preliminary result)   Collection Time: 04/12/15  5:06 PM  Result Value Ref  Range Status   Specimen Description URINE, RANDOM  Final   Special Requests Normal  Final   Culture NO GROWTH < 24 HOURS  Final   Report Status  PENDING  Incomplete    Radiology Reports US Venous Img Lower Unilateral Left  03/24/2015  CLINICAL DATA:  Left leg swelling for 3 weeks EXAM: Left LOWER EXTREMITY VENOUS DOPPLER ULTRASOUND TECHNIQUE: Gray-scale sonography with graded compression, as well as color Doppler and duplex ultrasound were performed to evaluate the lower extremity deep venous systems from the level of the common femoral vein and including the common femoral, femoral, profunda femoral, popliteal and calf veins including the posterior tibial, peroneal and gastrocnemius veins when visible. The superficial great saphenous vein was also interrogated. Spectral Doppler was utilized to evaluate flow at rest and with distal augmentation maneuvers in the common femoral, femoral and popliteal veins. COMPARISON:  None. FINDINGS: Contralateral Common Femoral Vein: Respiratory phasicity is normal and symmetric with the symptomatic side. No evidence of thrombus. Normal compressibility. Common Femoral Vein: No evidence of thrombus. Normal compressibility, respiratory phasicity and response to augmentation. Saphenofemoral Junction: No evidence of thrombus. Normal compressibility and flow on color Doppler imaging. Profunda Femoral Vein: No evidence of thrombus. Normal compressibility and flow on color Doppler imaging. Femoral Vein: No evidence of thrombus. Normal compressibility, respiratory phasicity and response to augmentation. Popliteal Vein: No evidence of thrombus. Normal compressibility, respiratory phasicity and response to augmentation. Calf Veins: No evidence of thrombus. Normal compressibility and flow on color Doppler imaging. Superficial Great Saphenous Vein: No evidence of thrombus. Normal compressibility and flow on color Doppler imaging. Venous Reflux:  None. Other Findings: There is enlarged lymph node in left inguinal region measures 3.2 x 2.4 cm probable reactive. The technologist reports left left ankle swelling and redness and skin  wound. Clinical correlation is necessary. IMPRESSION: No evidence of deep venous thrombosis left lower extremity.There is enlarged lymph node in left inguinal region measures 3.2 x 2.4 cm probable reactive. The technologist reports left left ankle swelling and redness and skin wound. Clinical correlation is necessary. Electronically Signed   By: Lahoma Crocker M.D.   On: 03/24/2015 15:16   Dg Chest Portable 1 View  04/11/2015  CLINICAL DATA:  81 year old male with history of vomiting since this morning. Currently on antibiotics for cellulitis of the left leg. No current abdominal pain, diarrhea, chest pain or cough. EXAM: PORTABLE CHEST 1 VIEW COMPARISON:  Chest CT 04/09/2008. FINDINGS: Mild elevation of the right hemidiaphragm is unchanged. Lung volumes are normal. No consolidative airspace disease. No pleural effusions. No pneumothorax. No pulmonary nodule or mass noted. Pulmonary vasculature and the cardiomediastinal silhouette are within normal limits. IMPRESSION: 1. No radiographic evidence of acute cardiopulmonary disease. Electronically Signed   By: Vinnie Langton M.D.   On: 04/11/2015 15:42   Dg Femur Min 2 Views Left  04/11/2015  CLINICAL DATA:  Cellulitis and chronic left lower leg ulcer. Pain all over mid thigh. EXAM: LEFT FEMUR 2 VIEWS COMPARISON:  None. FINDINGS: Posttraumatic deformity of the femoral diaphysis is evident. Overlying soft tissues are unremarkable. Advanced degenerative changes are seen at the knee. IMPRESSION: Posttraumatic deformity mid femur with advanced osteoarthritis of the knee. Electronically Signed   By: Misty Stanley M.D.   On: 04/11/2015 20:30   US Abdomen Limited Ruq  04/11/2015  CLINICAL DATA:  Liver function abnormality. Elevated AST, ALT and alk-phos. Nausea and vomiting. EXAM: US ABDOMEN LIMITED - RIGHT UPPER QUADRANT COMPARISON:  CT 03/02/2010 FINDINGS: Gallbladder: Surgically absent.  Common bile duct: Diameter: 6 mm, normal. Liver: No focal lesion identified.  Within normal limits in parenchymal echogenicity. Mild central intrahepatic biliary ductal dilatation. IMPRESSION: 1. Postcholecystectomy. 2. Mild intrahepatic biliary ductal dilatation with normal caliber common bile duct, likely sequela of prior cholecystectomy. No sonographic evidence of central obstructing mass. 3. Otherwise normal sonographic appearance of the liver. Electronically Signed   By: Jeb Levering M.D.   On: 04/11/2015 18:01     CBC  Recent Labs Lab 04/11/15 1145 04/12/15 0346 04/13/15 0409  WBC 7.9 10.7* 11.5*  HGB 12.1* 11.1* 10.8*  HCT 35.7* 32.3* 30.9*  PLT 148* 123* 127*  MCV 95.4 95.5 94.2  MCH 32.3 33.0 32.9  MCHC 33.8 34.5 34.9  RDW 14.0 13.7 14.0    Chemistries   Recent Labs Lab 04/11/15 1145 04/12/15 0346 04/13/15 0409  NA 132* 132* 135  K 3.9 4.1 3.8  CL 100* 98* 101  CO2 _0 GLUCOSE 106* 103* 110*  BUN _1 CREATININE 1.03 0.99 0.88  CALCIUM 8.6* 8.1* 8.3*  AST 316* 247* 106*  ALT 327* 280* 181*  ALKPHOS 354* 372* 296*  BILITOT 1.2 2.9* 1.4*   ------------------------------------------------------------------------------------------------------------------ estimated creatinine clearance is 71.4 mL/min (by C-G formula based on Cr of 0.88). ------------------------------------------------------------------------------------------------------------------ No results for input(s): HGBA1C in the last 72 hours. ------------------------------------------------------------------------------------------------------------------ No results for input(s): CHOL, HDL, LDLCALC, TRIG, CHOLHDL, LDLDIRECT in the last 72 hours. ------------------------------------------------------------------------------------------------------------------ No results for input(s): TSH, T4TOTAL, T3FREE, THYROIDAB in the last 72 hours.  Invalid input(s):  FREET3 ------------------------------------------------------------------------------------------------------------------ No results for input(s): VITAMINB12, FOLATE, FERRITIN, TIBC, IRON, RETICCTPCT in the last 72 hours.  Coagulation profile  Recent Labs Lab 04/11/15 1726  INR 1.15    No results for input(s): DDIMER in the last 72 hours.  Cardiac Enzymes  Recent Labs Lab 04/11/15 1145  TROPONINI <0.03   ------------------------------------------------------------------------------------------------------------------ Invalid input(s): POCBNP    Assessment & Plan  Patient is a 81 year old white male with recurrent lower extremity cellulitis   1. Left leg cellulitis, failed outpatient therapy with Bactrim and Keflex. Continue Unasyn and vancomycin. Patient's rash is improving    2. Petechial rash: Related to cellulitis since improvement doubt any embolic phenomena  3.  Elevated liver function tests Appetite is panel and autoimmune hepatitis workup is currently pending, ultrasound of the liver is negative LFTs improved with IV hydration  4. Restless leg syndrome  continue Requip  5. Hypothyroidism continue Synthroid  6. Patient complains of severe tenderness in the lower back and I will add ketorolac, and a muscle relaxant * DVT prophylaxis with Lovenox  All the records are reviewed and case discussed with ED provider. Management plans discussed with the patient, family and they are in agreement.      Code Status Orders        Start     Ordered   04/11/15 1702  Full code   Continuous     04/11/15 1702    Code Status History    Date Active Date Inactive Code Status Order ID Comments User Context   04/03/2015 12:40 PM 04/06/2015  3:24 PM Full Code [id number]  Hillary Bow, MD Inpatient           Consults ID  DVT Prophylaxis  Lovenox  Lab Results  Component Value Date   PLT 127* 04/13/2015     Time Spent in minutes   10mn  PDustin FlockM.D  on 04/13/2015 at 12:41 PM  Between 7am to 6pm -  Pager - [phone]  After 6pm go to [email or web address] - password [email or web address] [facility]   Office  [phone]

## 2015-04-13 NOTE — Progress Notes (Signed)
Spoke with DR. Sudini about reflux may up his dose of protonix to bid

## 2015-04-14 ENCOUNTER — Encounter: Payer: Self-pay | Admitting: Radiology

## 2015-04-14 ENCOUNTER — Inpatient Hospital Stay: Payer: Medicare Other

## 2015-04-14 LAB — URINE CULTURE
Culture: NO GROWTH
SPECIAL REQUESTS: NORMAL

## 2015-04-14 LAB — BASIC METABOLIC PANEL
Anion gap: 5 (ref 5–15)
BUN: 15 mg/dL (ref 6–20)
CHLORIDE: 99 mmol/L — AB (ref 101–111)
CO2: 29 mmol/L (ref 22–32)
Calcium: 8.2 mg/dL — ABNORMAL LOW (ref 8.9–10.3)
Creatinine, Ser: 0.88 mg/dL (ref 0.61–1.24)
GFR calc Af Amer: >60 mL/min (ref >60)
GFR calc non Af Amer: >60 mL/min (ref >60)
GLUCOSE: 131 mg/dL — AB (ref 65–99)
Potassium: 4 mmol/L (ref 3.5–5.1)
Sodium: 133 mmol/L — ABNORMAL LOW (ref 135–145)

## 2015-04-14 MED ORDER — MORPHINE SULFATE (PF) 2 MG/ML IV SOLN
INTRAVENOUS | Status: AC
  Filled 2015-04-14: qty 1

## 2015-04-14 MED ORDER — MORPHINE SULFATE (PF) 2 MG/ML IV SOLN
2.0000 mg | Freq: Once | INTRAVENOUS | Status: AC
  Administered 2015-04-14: 2 mg via INTRAVENOUS
  Filled 2015-04-14 (×2): qty 1

## 2015-04-14 MED ORDER — PREDNISONE 50 MG PO TABS
50.0000 mg | ORAL_TABLET | Freq: Once | ORAL | Status: AC
  Administered 2015-04-14: 50 mg via ORAL
  Filled 2015-04-14: qty 1

## 2015-04-14 MED ORDER — IOHEXOL 350 MG/ML SOLN
125.0000 mL | Freq: Once | INTRAVENOUS | Status: AC | PRN
  Administered 2015-04-14: 125 mL via INTRAVENOUS

## 2015-04-14 NOTE — Clinical Documentation Improvement (Signed)
Internal Medicine  Abnormal Lab/Test Results:   Component     Latest Ref Rng 04/03/2015 04/11/2015 04/12/2015 04/13/2015 04/14/2015  Sodium     135 - 145 mmol/L 140 132 (L) 132 (L) 135 133 (L)  Chloride     101 - 111 mmol/L 106 100 (L) 98 (L) 101 99 (L)    Possible Clinical Conditions associated with below indicators  Hyponatremia  Other Condition  Cannot Clinically Determine   Supporting Information: H&P: Gastrointestinal: Positive for nausea and vomiting. Positive for weakness Treatment Provided: NaCl 1000 ml IV  Once     Please exercise your independent, professional judgment when responding. A specific answer is not anticipated or expected. Please update your documentation within the medical record to reflect your response to this query. Thank you  Thank Barrie DunkerYou,  Jaycee Mckellips C [facility] [place] [phone]

## 2015-04-14 NOTE — Progress Notes (Signed)
[place] at [facility]                                                                                                                                                                                            Patient Demographics   Ronald Huerta, is a 81 y.o. male, DOB - Jun 24, 1934, HEN:[id number]  Admit date - 04/11/2015   Admitting Physician Hillary Bow, MD  Outpatient Primary MD for the patient is HANDE,VISHWANATH, MD   LOS - 3  Subjective: Subjective:  Patient is complaining of pain on his left thigh. Also he has the petechiae that are on his legs little worse compared to yesterday. He reports that his back pain is better    Review of Systems:   CONSTITUTIONAL: No documented fever. No fatigue, weakness. No weight gain, no weight loss.  EYES: No blurry or double vision.  ENT: No tinnitus. No postnasal drip. No redness of the oropharynx.  RESPIRATORY: No cough, no wheeze, no hemoptysis. No dyspnea.  CARDIOVASCULAR: No chest pain. No orthopnea. No palpitations. No syncope.  GASTROINTESTINAL: No nausea, no vomiting or diarrhea. No abdominal pain. No melena or hematochezia.  GENITOURINARY: No dysuria or hematuria.  ENDOCRINE: No polyuria or nocturia. No heat or cold intolerance.  HEMATOLOGY: No anemia. No bruising. No bleeding.  INTEGUMENTARY: Rash and erythema involving his leg petechial rash involving both legs MUSCULOSKELETAL: Positive hip pain, back pain NEUROLOGIC: No numbness, tingling, or ataxia. No seizure-type activity.  PSYCHIATRIC: No anxiety. No insomnia. No ADD.    Vitals:   Filed Vitals:   04/14/15 0415 04/14/15 0500 04/14/15 0519 04/14/15 0725  BP: 125/45   112/48  Pulse: 108   89  Temp: 101.6 F (38.7 C)  99.6 F (37.6 C) 97.8 F (36.6 C)  TempSrc: Oral  Oral Oral  Resp: 20   18  Height:      Weight:  76.204 kg (168 lb)    SpO2: 90%   91%    Wt Readings from Last 3 Encounters:  04/14/15 76.204 kg (168 lb)   04/06/15 72.031 kg (158 lb 12.8 oz)  02/10/15 75.025 kg (165 lb 6.4 oz)     Intake/Output Summary (Last 24 hours) at 04/14/15 1248 Last data filed at 04/14/15 0617  Gross per 24 hour  Intake   1950 ml  Output      0 ml  Net   1950 ml    Physical Exam:   GENERAL: Pleasant-appearing in no apparent distress.  HEAD, EYES, EARS, NOSE AND THROAT: Atraumatic, normocephalic. Extraocular muscles are intact. Pupils equal and reactive to light. Sclerae anicteric. No  conjunctival injection. No oro-pharyngeal erythema.  NECK: Supple. There is no jugular venous distention. No bruits, no lymphadenopathy, no thyromegaly.  HEART: Regular rate and rhythm,. No murmurs, no rubs, no clicks.  LUNGS: Clear to auscultation bilaterally. No rales or rhonchi. No wheezes.  ABDOMEN: Soft, flat, nontender, nondistended. Has good bowel sounds. No hepatosplenomegaly appreciated.  EXTREMITIES: No evidence of any cyanosis, clubbing, or peripheral edema.  +2 pedal and radial pulses bilaterally.  NEUROLOGIC: The patient is alert, awake, and oriented x3 with no focal motor or sensory deficits appreciated bilaterally.  SKIN: Right lower extremity erythema improved , petechial rash appears to be recurring. Lower extremities  Psych: Not anxious, depressed LN: No inguinal LN enlargement    Antibiotics   Anti-infectives    Start     Dose/Rate Route Frequency Ordered Stop   04/13/15 2218  vancomycin (VANCOCIN) 1,250 mg in sodium chloride 0.9 % 250 mL IVPB     1,250 mg 166.7 mL/hr over 90 Minutes Intravenous Every 12 hours 04/13/15 2219     04/12/15 1200  Ampicillin-Sulbactam (UNASYN) 3 g in sodium chloride 0.9 % 100 mL IVPB     3 g 100 mL/hr over 60 Minutes Intravenous Every 6 hours 04/12/15 1158     04/11/15 2200  vancomycin (VANCOCIN) 1,250 mg in sodium chloride 0.9 % 250 mL IVPB  Status:  Discontinued     1,250 mg 166.7 mL/hr over 90 Minutes Intravenous Every 24 hours 04/11/15 1759 04/13/15 2219   04/11/15 1530   vancomycin (VANCOCIN) IVPB 1000 mg/200 mL premix     1,000 mg 200 mL/hr over 60 Minutes Intravenous  Once 04/11/15 1523 04/11/15 1721      Medications   Scheduled Meds: . ampicillin-sulbactam (UNASYN) IV  3 g Intravenous Q6H  . aspirin EC  81 mg Oral Daily  . carisoprodol  350 mg Oral TID  . enoxaparin (LOVENOX) injection  40 mg Subcutaneous Q24H  . feeding supplement (ENSURE ENLIVE)  237 mL Oral BID BM  . ketorolac  15 mg Intravenous 3 times per day  . levothyroxine  25 mcg Oral Q0600  . multivitamin with minerals  1 tablet Oral Daily  . pantoprazole  40 mg Oral BID  . psyllium  1 packet Oral Daily  . rOPINIRole  0.75 mg Oral TID  . sodium chloride  1,000 mL Intravenous Once  . terazosin  5 mg Oral Daily  . vancomycin  1,250 mg Intravenous Q12H   Continuous Infusions:  PRN Meds:.acetaminophen **OR** acetaminophen, bisacodyl, HYDROcodone-acetaminophen, ondansetron (ZOFRAN) IV, polyethylene glycol   Data Review:   Micro Results Recent Results (from the past 240 hour(s))  Blood culture (routine x 2)     Status: None (Preliminary result)   Collection Time: 04/11/15  3:25 PM  Result Value Ref Range Status   Specimen Description BLOOD LEFT ARM  Final   Special Requests BOTTLES DRAWN AEROBIC AND ANAEROBIC [place]  Final   Culture NO GROWTH 3 DAYS  Final   Report Status PENDING  Incomplete  Rapid Influenza A&B Antigens ([facility] only)     Status: None   Collection Time: 04/11/15  3:26 PM  Result Value Ref Range Status   Influenza A ([facility]) NEGATIVE NEGATIVE Final   Influenza B ([facility]) NEGATIVE NEGATIVE Final  Blood culture (routine x 2)     Status: None (Preliminary result)   Collection Time: 04/11/15  3:30 PM  Result Value Ref Range Status   Specimen Description BLOOD RIGHT ARM  Final   Special Requests BOTTLES  DRAWN AEROBIC AND ANAEROBIC [place]  Final   Culture NO GROWTH 3 DAYS  Final   Report Status PENDING  Incomplete  Urine culture     Status: None   Collection Time: 04/12/15   5:06 PM  Result Value Ref Range Status   Specimen Description URINE, RANDOM  Final   Special Requests Normal  Final   Culture NO GROWTH 2 DAYS  Final   Report Status 04/14/2015 FINAL  Final    Radiology Reports US Venous Img Lower Unilateral Left  03/24/2015  CLINICAL DATA:  Left leg swelling for 3 weeks EXAM: Left LOWER EXTREMITY VENOUS DOPPLER ULTRASOUND TECHNIQUE: Gray-scale sonography with graded compression, as well as color Doppler and duplex ultrasound were performed to evaluate the lower extremity deep venous systems from the level of the common femoral vein and including the common femoral, femoral, profunda femoral, popliteal and calf veins including the posterior tibial, peroneal and gastrocnemius veins when visible. The superficial great saphenous vein was also interrogated. Spectral Doppler was utilized to evaluate flow at rest and with distal augmentation maneuvers in the common femoral, femoral and popliteal veins. COMPARISON:  None. FINDINGS: Contralateral Common Femoral Vein: Respiratory phasicity is normal and symmetric with the symptomatic side. No evidence of thrombus. Normal compressibility. Common Femoral Vein: No evidence of thrombus. Normal compressibility, respiratory phasicity and response to augmentation. Saphenofemoral Junction: No evidence of thrombus. Normal compressibility and flow on color Doppler imaging. Profunda Femoral Vein: No evidence of thrombus. Normal compressibility and flow on color Doppler imaging. Femoral Vein: No evidence of thrombus. Normal compressibility, respiratory phasicity and response to augmentation. Popliteal Vein: No evidence of thrombus. Normal compressibility, respiratory phasicity and response to augmentation. Calf Veins: No evidence of thrombus. Normal compressibility and flow on color Doppler imaging. Superficial Great Saphenous Vein: No evidence of thrombus. Normal compressibility and flow on color Doppler imaging. Venous Reflux:  None. Other  Findings: There is enlarged lymph node in left inguinal region measures 3.2 x 2.4 cm probable reactive. The technologist reports left left ankle swelling and redness and skin wound. Clinical correlation is necessary. IMPRESSION: No evidence of deep venous thrombosis left lower extremity.There is enlarged lymph node in left inguinal region measures 3.2 x 2.4 cm probable reactive. The technologist reports left left ankle swelling and redness and skin wound. Clinical correlation is necessary. Electronically Signed   By: Lahoma Crocker M.D.   On: 03/24/2015 15:16   Dg Chest Portable 1 View  04/11/2015  CLINICAL DATA:  81 year old male with history of vomiting since this morning. Currently on antibiotics for cellulitis of the left leg. No current abdominal pain, diarrhea, chest pain or cough. EXAM: PORTABLE CHEST 1 VIEW COMPARISON:  Chest CT 04/09/2008. FINDINGS: Mild elevation of the right hemidiaphragm is unchanged. Lung volumes are normal. No consolidative airspace disease. No pleural effusions. No pneumothorax. No pulmonary nodule or mass noted. Pulmonary vasculature and the cardiomediastinal silhouette are within normal limits. IMPRESSION: 1. No radiographic evidence of acute cardiopulmonary disease. Electronically Signed   By: Vinnie Langton M.D.   On: 04/11/2015 15:42   Dg Femur Min 2 Views Left  04/11/2015  CLINICAL DATA:  Cellulitis and chronic left lower leg ulcer. Pain all over mid thigh. EXAM: LEFT FEMUR 2 VIEWS COMPARISON:  None. FINDINGS: Posttraumatic deformity of the femoral diaphysis is evident. Overlying soft tissues are unremarkable. Advanced degenerative changes are seen at the knee. IMPRESSION: Posttraumatic deformity mid femur with advanced osteoarthritis of the knee. Electronically Signed   By: Verda Cumins.D.  On: 04/11/2015 20:30   US Abdomen Limited Ruq  04/11/2015  CLINICAL DATA:  Liver function abnormality. Elevated AST, ALT and alk-phos. Nausea and vomiting. EXAM: US ABDOMEN  LIMITED - RIGHT UPPER QUADRANT COMPARISON:  CT 03/02/2010 FINDINGS: Gallbladder: Surgically absent. Common bile duct: Diameter: 6 mm, normal. Liver: No focal lesion identified. Within normal limits in parenchymal echogenicity. Mild central intrahepatic biliary ductal dilatation. IMPRESSION: 1. Postcholecystectomy. 2. Mild intrahepatic biliary ductal dilatation with normal caliber common bile duct, likely sequela of prior cholecystectomy. No sonographic evidence of central obstructing mass. 3. Otherwise normal sonographic appearance of the liver. Electronically Signed   By: Jeb Levering M.D.   On: 04/11/2015 18:01     CBC  Recent Labs Lab 04/11/15 1145 04/12/15 0346 04/13/15 0409  WBC 7.9 10.7* 11.5*  HGB 12.1* 11.1* 10.8*  HCT 35.7* 32.3* 30.9*  PLT 148* 123* 127*  MCV 95.4 95.5 94.2  MCH 32.3 33.0 32.9  MCHC 33.8 34.5 34.9  RDW 14.0 13.7 14.0    Chemistries   Recent Labs Lab 04/11/15 1145 04/12/15 0346 04/13/15 0409 04/14/15 0430  NA 132* 132* 135 133*  K 3.9 4.1 3.8 4.0  CL 100* 98* 101 99*  CO2 _0 GLUCOSE 106* 103* 110* 131*  BUN _1 CREATININE 1.03 0.99 0.88 0.88  CALCIUM 8.6* 8.1* 8.3* 8.2*  AST 316* 247* 106*  --   ALT 327* 280* 181*  --   ALKPHOS 354* 372* 296*  --   BILITOT 1.2 2.9* 1.4*  --    ------------------------------------------------------------------------------------------------------------------ estimated creatinine clearance is 71.4 mL/min (by C-G formula based on Cr of 0.88). ------------------------------------------------------------------------------------------------------------------ No results for input(s): HGBA1C in the last 72 hours. ------------------------------------------------------------------------------------------------------------------ No results for input(s): CHOL, HDL, LDLCALC, TRIG, CHOLHDL, LDLDIRECT in the last 72  hours. ------------------------------------------------------------------------------------------------------------------ No results for input(s): TSH, T4TOTAL, T3FREE, THYROIDAB in the last 72 hours.  Invalid input(s): FREET3 ------------------------------------------------------------------------------------------------------------------ No results for input(s): VITAMINB12, FOLATE, FERRITIN, TIBC, IRON, RETICCTPCT in the last 72 hours.  Coagulation profile  Recent Labs Lab 04/11/15 1726  INR 1.15    No results for input(s): DDIMER in the last 72 hours.  Cardiac Enzymes  Recent Labs Lab 04/11/15 1145  TROPONINI <0.03   ------------------------------------------------------------------------------------------------------------------ Invalid input(s): POCBNP    Assessment & Plan  Patient is a 82 year old white male with recurrent lower extremity cellulitis   1. Left leg cellulitis, failed outpatient therapy with Bactrim and Keflex. Continue Unasyn and vancomycin. Continues to have erythema involving the leg daughter states that there was drainage from the leg I will get a MRI to make sure that he doesn't have any evidence of osteomyelitis   2. Petechial rash: Could be an embolic phenomenon I'll do a CT angiogram of his lower extremity  3.  Elevated liver function tests Hepatitis panel negative no evidence of autoimmune hepatitis will need outpatient follow-up repeat LFTs in the morning   4. Restless leg syndrome  continue Requip  5. Hypothyroidism continue Synthroid  6. Muscle pain in his left thigh I will continue Soma as was given yesterday. * DVT prophylaxis with Lovenox  All the records are reviewed and case discussed with ED provider. Management plans discussed with the patient, family and they are in agreement.      Code Status Orders        Start     Ordered   04/11/15 1702  Full code   Continuous     04/11/15 1702    Code  Status History    Date  Active Date Inactive Code Status Order ID Comments User Context   04/03/2015 12:40 PM 04/06/2015  3:24 PM Full Code [id number]  Hillary Bow, MD Inpatient           Consults ID  DVT Prophylaxis  Lovenox  Lab Results  Component Value Date   PLT 127* 04/13/2015     Time Spent in minutes   38mn  PDustin FlockM.D on 04/14/2015 at 12:48 PM  Between 7am to 6pm - Pager - [phone]  After 6pm go to [email or web address] - password [email or web address]   Office  [phone]

## 2015-04-14 NOTE — Progress Notes (Addendum)
Pharmacy Antibiotic Note  Ronald Huerta is a 81 y.o. male admitted on 04/11/2015 with cellulitis.  Pharmacy  consulted for vancomycin and unsayn dosing. Patient has a chronic LLE ulcer and was recently treated with bactrim and cephalexin outpatient. He was recently admitted to hospital and responded to vancomycin. ID would like to continue Vancomycin and Unasyn for now but can dc on Keflex and Doxy for 10 days.    Plan:  2nd shift PharmD changed to Vancomycin 1250 mg IV q12 hours. Will continue current regimen. Vancomycin level ordered be drawn prior to the 2230 dose on 3/14.   Continue Unasyn 3 g IV q6 hours.    Height: 5\' 10"  (177.8 cm) Weight: 168 lb (76.204 kg) IBW/kg (Calculated) : 73. Used actual body weight of 71.7 kg  Temp (24hrs), Avg:98.8 F (37.1 C), Min:97.3 F (36.3 C), Max:101.6 F (38.7 C)   Recent Labs Lab 04/11/15 1145 04/11/15 1526 04/12/15 0346 04/13/15 0409 04/13/15 2131 04/14/15 0430  WBC 7.9  --  10.7* 11.5*  --   --   CREATININE 1.03  --  0.99 0.88  --  0.88  LATICACIDVEN  --  0.9  --   --   --   --   VANCOTROUGH  --   --   --   --  5*  --     Estimated Creatinine Clearance: 71.4 mL/min (by C-G formula based on Cr of 0.88).    No Known Allergies  Antimicrobials this admission: vancomycin 3/11 >>   Dose adjustments this admission: Vancomycin 1250 mg IV q12 hours 3/13.   Microbiology results: 3/11 BCx: Sent  Thank you for allowing pharmacy to be a part of this patient's care.  Demetrius CharityJames,Rayelle Armor D, PharmD Clinical Pharmacist 04/14/2015 10:42 AM

## 2015-04-14 NOTE — Progress Notes (Signed)
Patient having difficult time voiding and what patient was able to void was minimal amount 50 ml.  Bladder scan showed large volume present, paged MD for foley insertion.  Patient states he has had this happen twice before while in hospital and previous times received foley not In/Out.

## 2015-04-14 NOTE — Care Management Note (Signed)
Case Management Note  Patient Details  Name: Ronald Huerta MRN: [id number] Date of Birth: 04-22-1934  Subjective/Objective:      80yo Mr Ronald Huerta was admitted 04/11/15 with left leg cellulitis after failing outpatient oral antibiotic therapy. He is deaf but can respond to short questions by writing on a whiteboard. For more in-depth conversations he requires a sign language interpreter. PCP=Dr Hande. Pharmacy=[facility] [address] Mr Ronald Huerta resides at home with his son and daughter. His son Ronald Huerta reports no home assistive equipment, no home oxygen, no home health services. Son reports "He gets around pretty good at home." Case management will follow for discharge planning.               Action/Plan:   Expected Discharge Date:                  Expected Discharge Plan:     In-House Referral:     Discharge planning Services     Post Acute Care Choice:    Choice offered to:     DME Arranged:    DME Agency:     HH Arranged:    HH Agency:     Status of Service:     Medicare Important Message Given:  Yes Date Medicare IM Given:    Medicare IM give by:    Date Additional Medicare IM Given:    Additional Medicare Important Message give by:     If discussed at Long Length of Stay Meetings, dates discussed:    Additional Comments:  Carlyn Lemke A, RN 04/14/2015, 3:00 PM

## 2015-04-14 NOTE — Progress Notes (Signed)
Pharmacy Antibiotic Note  Ronald Huerta is a 81 y.o. male admitted on 04/11/2015 with cellulitis.  Pharmacy  consulted for vancomycin and unsayn dosing. Patient has a chronic LLE ulcer and was recently treated with bactrim and cephalexin outpatient. He was recently admitted to hospital and responded to vancomycin. ID would like to continue Vancomycin and Unasyn for now but can dc on Keflex and Doxy for 10 days.    Plan:  3/13: 2nd shift PharmD changed to Vancomycin 1250 mg IV q12 hours. Will continue current regimen. Vancomycin level ordered be drawn prior to the 2230 dose on 3/14.   3/14 PM: Trough ordered for 2200, dose given at 2153. Will cancel trough since dose was administered before lab drawn. Will reorder trough for 3/15 at 0930 (before 4th dose new regimen). Asked RN to pass on to AM RN to wait for trough to be drawn before AM dose is given.  SCr has been stable.    Height: 5\' 10"  (177.8 cm) Weight: 168 lb (76.204 kg) IBW/kg (Calculated) : 73. Used actual body weight of 71.7 kg  Temp (24hrs), Avg:99.1 F (37.3 C), Min:97.8 F (36.6 C), Max:101.6 F (38.7 C)   Recent Labs Lab 04/11/15 1145 04/11/15 1526 04/12/15 0346 04/13/15 0409 04/13/15 2131 04/14/15 0430  WBC 7.9  --  10.7* 11.5*  --   --   CREATININE 1.03  --  0.99 0.88  --  0.88  LATICACIDVEN  --  0.9  --   --   --   --   VANCOTROUGH  --   --   --   --  5*  --     Estimated Creatinine Clearance: 71.4 mL/min (by C-G formula based on Cr of 0.88).    No Known Allergies  Antimicrobials this admission: vancomycin 3/11 >>   Dose adjustments this admission: Vancomycin 1250 mg IV q12 hours 3/13.   Microbiology results: 3/11 BCx: Sent  Thank you for allowing pharmacy to be a part of this patient's care.  Marty HeckWang, Treylan Mcclintock L, PharmD Clinical Pharmacist 04/14/2015 10:47 PM

## 2015-04-14 NOTE — Care Management Important Message (Signed)
Important Message  Patient Details  Name: Ronald Huerta MRN: [id number] Date of Birth: [birth year]   Medicare Important Message Given:  Yes    Joseph Johns A, RN 04/14/2015, 9:15 AM

## 2015-04-14 NOTE — Progress Notes (Signed)
Dr. Allena KatzPatel rounded while Sign Language interpretor present, daughter Delice Bisonara and son [place]had present during MD rounding.    Medications and treatment plan discussed with patient and family.  No complaints or questions.

## 2015-04-14 NOTE — Progress Notes (Addendum)
Patient went to CT and then to MRI and was unable to tolerate MRI due to intense pain. Dr. Sampson GoonFitzgerald was present and ordered morphine .  Went to MRI and patient refused to complete test due to pain.  Upon return to room, patient in pain, po medication was given but unsuccessful.  Paged Dr. Allena KatzPatel to confirm ok to give IV medication this close to Po medication and he approved giving dose as patient pain was progressively intensifying.

## 2015-04-15 ENCOUNTER — Inpatient Hospital Stay: Payer: Medicare Other

## 2015-04-15 LAB — COMPREHENSIVE METABOLIC PANEL
ALK PHOS: 231 U/L — AB (ref 38–126)
ALT: 93 U/L — AB (ref 17–63)
AST: 39 U/L (ref 15–41)
Albumin: 2.5 g/dL — ABNORMAL LOW (ref 3.5–5.0)
Anion gap: 6 (ref 5–15)
BUN: 17 mg/dL (ref 6–20)
CHLORIDE: 99 mmol/L — AB (ref 101–111)
CO2: 29 mmol/L (ref 22–32)
Calcium: 8.2 mg/dL — ABNORMAL LOW (ref 8.9–10.3)
Creatinine, Ser: 0.79 mg/dL (ref 0.61–1.24)
GFR calc Af Amer: >60 mL/min (ref >60)
GFR calc non Af Amer: >60 mL/min (ref >60)
GLUCOSE: 186 mg/dL — AB (ref 65–99)
Potassium: 3.9 mmol/L (ref 3.5–5.1)
SODIUM: 134 mmol/L — AB (ref 135–145)
Total Bilirubin: 0.8 mg/dL (ref 0.3–1.2)
Total Protein: 5.9 g/dL — ABNORMAL LOW (ref 6.5–8.1)

## 2015-04-15 LAB — CBC
HCT: 30.9 % — ABNORMAL LOW (ref 40.0–52.0)
HEMOGLOBIN: 10.5 g/dL — AB (ref 13.0–18.0)
MCH: 32.3 pg (ref 26.0–34.0)
MCHC: 34 g/dL (ref 32.0–36.0)
MCV: 94.8 fL (ref 80.0–100.0)
Platelets: 184 10*3/uL (ref 150–440)
RBC: 3.26 MIL/uL — AB (ref 4.40–5.90)
RDW: 13.9 % (ref 11.5–14.5)
WBC: 9.3 10*3/uL (ref 3.8–10.6)

## 2015-04-15 LAB — VANCOMYCIN, TROUGH: Vancomycin Tr: 17 ug/mL (ref 10–20)

## 2015-04-15 MED ORDER — TAMSULOSIN HCL 0.4 MG PO CAPS
0.4000 mg | ORAL_CAPSULE | Freq: Every day | ORAL | Status: DC
  Administered 2015-04-15 – 2015-04-18 (×4): 0.4 mg via ORAL
  Filled 2015-04-15 (×4): qty 1

## 2015-04-15 MED ORDER — PREDNISONE 50 MG PO TABS
50.0000 mg | ORAL_TABLET | Freq: Once | ORAL | Status: AC
  Administered 2015-04-15: 50 mg via ORAL
  Filled 2015-04-15: qty 1

## 2015-04-15 MED ORDER — VANCOMYCIN HCL IN DEXTROSE 1-5 GM/200ML-% IV SOLN
1000.0000 mg | Freq: Two times a day (BID) | INTRAVENOUS | Status: DC
  Administered 2015-04-16 – 2015-04-17 (×4): 1000 mg via INTRAVENOUS
  Filled 2015-04-15 (×6): qty 200

## 2015-04-15 MED ORDER — HYDROMORPHONE HCL 1 MG/ML IJ SOLN
2.0000 mg | Freq: Once | INTRAMUSCULAR | Status: AC
  Administered 2015-04-15: 2 mg via INTRAVENOUS
  Filled 2015-04-15: qty 2

## 2015-04-15 NOTE — Progress Notes (Signed)
[facility] CLINIC INFECTIOUS DISEASE PROGRESS NOTE Date of Admission:  04/11/2015     ID: Ronald Huerta is a 81 y.o. male with LE cellulitis Active Problems:   Cellulitis   Subjective: Still having pain and spasming of L thigh and pain around knee.  Last fever 3/13.  ROS  Unable to obtain  Medications:  Antibiotics Given (last 72 hours)    Date/Time Action Medication Dose Rate   04/12/15 1726 Given   Ampicillin-Sulbactam (UNASYN) 3 g in sodium chloride 0.9 % 100 mL IVPB 3 g 100 mL/hr   04/12/15 2115 Given   vancomycin (VANCOCIN) 1,250 mg in sodium chloride 0.9 % 250 mL IVPB 1,250 mg 166.7 mL/hr   04/12/15 2321 Given   Ampicillin-Sulbactam (UNASYN) 3 g in sodium chloride 0.9 % 100 mL IVPB 3 g 100 mL/hr   04/13/15 0543 Given   Ampicillin-Sulbactam (UNASYN) 3 g in sodium chloride 0.9 % 100 mL IVPB 3 g 100 mL/hr   04/13/15 1339 Given   Ampicillin-Sulbactam (UNASYN) 3 g in sodium chloride 0.9 % 100 mL IVPB 3 g 100 mL/hr   04/13/15 1906 Given   Ampicillin-Sulbactam (UNASYN) 3 g in sodium chloride 0.9 % 100 mL IVPB 3 g 100 mL/hr   04/13/15 2220 Given   vancomycin (VANCOCIN) 1,250 mg in sodium chloride 0.9 % 250 mL IVPB 1,250 mg 166.7 mL/hr   04/13/15 2349 Given   Ampicillin-Sulbactam (UNASYN) 3 g in sodium chloride 0.9 % 100 mL IVPB 3 g 100 mL/hr   04/14/15 0509 Given   Ampicillin-Sulbactam (UNASYN) 3 g in sodium chloride 0.9 % 100 mL IVPB 3 g 100 mL/hr   04/14/15 1005 Given   vancomycin (VANCOCIN) 1,250 mg in sodium chloride 0.9 % 250 mL IVPB 1,250 mg 166.7 mL/hr   04/14/15 1225 Given   Ampicillin-Sulbactam (UNASYN) 3 g in sodium chloride 0.9 % 100 mL IVPB 3 g 100 mL/hr   04/14/15 1752 Given   Ampicillin-Sulbactam (UNASYN) 3 g in sodium chloride 0.9 % 100 mL IVPB 3 g 100 mL/hr   04/14/15 2153 Given   vancomycin (VANCOCIN) 1,250 mg in sodium chloride 0.9 % 250 mL IVPB 1,250 mg 166.7 mL/hr   04/15/15 0058 Given   Ampicillin-Sulbactam (UNASYN) 3 g in sodium chloride 0.9 % 100  mL IVPB 3 g 100 mL/hr   04/15/15 0531 Given   Ampicillin-Sulbactam (UNASYN) 3 g in sodium chloride 0.9 % 100 mL IVPB 3 g 100 mL/hr   04/15/15 0921 Given   vancomycin (VANCOCIN) 1,250 mg in sodium chloride 0.9 % 250 mL IVPB 1,250 mg 166.7 mL/hr   04/15/15 1423 Given  [moved patient off unit for test]   Ampicillin-Sulbactam (UNASYN) 3 g in sodium chloride 0.9 % 100 mL IVPB 3 g 100 mL/hr     . ampicillin-sulbactam (UNASYN) IV  3 g Intravenous Q6H  . aspirin EC  81 mg Oral Daily  . carisoprodol  350 mg Oral TID  . enoxaparin (LOVENOX) injection  40 mg Subcutaneous Q24H  . feeding supplement (ENSURE ENLIVE)  237 mL Oral BID BM  . ketorolac  15 mg Intravenous 3 times per day  . levothyroxine  25 mcg Oral Q0600  . multivitamin with minerals  1 tablet Oral Daily  . pantoprazole  40 mg Oral BID  . psyllium  1 packet Oral Daily  . rOPINIRole  0.75 mg Oral TID  . sodium chloride  1,000 mL Intravenous Once  . tamsulosin  0.4 mg Oral Daily  . terazosin  5 mg  Oral Daily  . vancomycin  1,250 mg Intravenous Q12H    Objective: Vital signs in last 24 hours: Temp:  [97.4 F (36.3 C)-98.4 F (36.9 C)] 97.8 F (36.6 C) (03/15 1429) Pulse Rate:  [82-89] 89 (03/15 1429) Resp:  [16-19] 16 (03/15 1429) BP: (100-138)/(49-70) 138/70 mmHg (03/15 1429) SpO2:  [92 %-94 %] 93 % (03/15 1429) Weight:  [79.561 kg (175 lb 6.4 oz)] 79.561 kg (175 lb 6.4 oz) (03/15 0442) Constitutional: He is oriented to person, place, and time. Deaf, NAD HENT: perrla Mouth/Throat: Oropharynx is clear and moist. No oropharyngeal exudate.  Cardiovascular: Normal rate, regular rhythm and normal heart sounds.  Pulmonary/Chest: Effort normal and breath sounds normal. No respiratory distress. He has no wheezes.  Abdominal: Soft. Bowel sounds are normal. He exhibits no distension. There is no tenderness.  Lymphadenopathy: He has no cervical adenopathy.  Neurological: He is alert and oriented to person, place, and time.  Skin:  LLE with area of scaly erythema and mild warmth, no purulence, 1+ pitting edema. Free motion of ankle. No real open wound Psychiatric: He has a normal mood and affect. His behavior is normal. Lab Results  Recent Labs  04/13/15 0409 04/14/15 0430 04/15/15 0459  WBC 11.5*  --  9.3  HGB 10.8*  --  10.5*  HCT 30.9*  --  30.9*  NA 135 133* 134*  K 3.8 4.0 3.9  CL 101 99* 99*  CO2 BUN CREATININE 0.88 0.88 0.79    Microbiology: Results for orders placed or performed during the hospital encounter of 04/11/15  Blood culture (routine x 2)     Status: None (Preliminary result)   Collection Time: 04/11/15  3:25 PM  Result Value Ref Range Status   Specimen Description BLOOD LEFT ARM  Final   Special Requests BOTTLES DRAWN AEROBIC AND ANAEROBIC 7CC  Final   Culture NO GROWTH 4 DAYS  Final   Report Status PENDING  Incomplete  Rapid Influenza A&B Antigens ([facility] only)     Status: None   Collection Time: 04/11/15  3:26 PM  Result Value Ref Range Status   Influenza A ([facility]) NEGATIVE NEGATIVE Final   Influenza B ([facility]) NEGATIVE NEGATIVE Final  Blood culture (routine x 2)     Status: None (Preliminary result)   Collection Time: 04/11/15  3:30 PM  Result Value Ref Range Status   Specimen Description BLOOD RIGHT ARM  Final   Special Requests BOTTLES DRAWN AEROBIC AND ANAEROBIC 7CC  Final   Culture NO GROWTH 4 DAYS  Final   Report Status PENDING  Incomplete  Urine culture     Status: None   Collection Time: 04/12/15  5:06 PM  Result Value Ref Range Status   Specimen Description URINE, RANDOM  Final   Special Requests Normal  Final   Culture NO GROWTH 2 DAYS  Final   Report Status 04/14/2015 FINAL  Final    Studies/Results: Ct Angio Ao+bifem W/cm &/or Wo/cm  04/14/2015  CLINICAL DATA:  Pt. C/o left thigh pain and lower back pain. Pt. Also has worsening petechiae on left leg as well as cellulitis of left foot. Pt. Has a hx of chronic lower extremity cellulitis. No hx of  CA. EXAM: CT ANGIOGRAPHY ABDOMEN AND PELVIS CT ANGIOGRAPHY BILATERAL LOWER EXTREMITIES TECHNIQUE: Multidetector CT imaging of the abdomen and pelvis was performed using the standard protocol during bolus administration of intravenous contrast. Axial helical CT of bilateral lower extremities after power injection  of intravenous contrast. Coronal and sagittal reconstructions were generated for vascular evaluation. Multiplanar reconstructed images including MIPs were obtained and reviewed to evaluate the vascular anatomy. CONTRAST:  125mL OMNIPAQUE IOHEXOL 350 MG/ML SOLN COMPARISON:  COMPARISON 03/02/2010 FINDINGS: ARTERIAL Aorta: Scattered atheromatous plaque. No aneurysm, dissection, or stenosis. Celiac axis: Mild short-segment stenosis of the level of the median arcuate ligament of the diaphragm, patent distally. Superior mesenteric:  Patent, classic distal branch anatomy. Left renal:           Single, patent Right renal:          Single, patent. Inferior mesenteric: Patent. There is a diminutive accessory visceral branch arising just cephalad to the IMA. Left iliac:           Patent Right iliac:          Patent Left lower extremity: Patent common femoral, deep femoral, SFA, popliteal arteries. Patent 3 vessel tibial runoff, with some limitation of assessment secondary to extensive venous opacification. Right lower extremity: Patent common femoral, deep femoral, SFA, and popliteal arteries. Patent trifurcation runoff, somewhat limited assessment due to extensive venous opacification. VENOUS: Dedicated venous phase imaging of the abdomen was not obtained. Early opacification of bilateral renal veins noted. Extensive calf and saphenous venous opacification below the knee bilaterally. Subcutaneous varicose veins communicating to the GSV in the medial distal left calf. Review of the MIP images confirms the above findings. NONVASCULAR Lower chest: Small pleural effusions. Consolidation or dependent atelectasis  posteriorly in the visualized lower lobes, new since previous. No pericardial effusion. Hepatobiliary: No masses or other significant abnormality. Cholecystectomy clips. Pancreas: No mass, inflammatory changes, or other significant abnormality. Spleen: Within normal limits in size and appearance. Adrenals/Urinary Tract: No masses identified. No evidence of hydronephrosis. Stomach/Bowel: No evidence of obstruction, inflammatory process, or abnormal fluid collections. Lymphatic: No pathologically enlarged lymph nodes. Reproductive: Marked prostatic enlargement protruding into the lumen of the urinary bladder, with central coarse calcifications. Other: Trace free fluid in the pelvis.  No free air. Musculoskeletal: Mild spondylitic changes in the lower thoracic and lumbar spine. Healed fracture deformity of the left femur. Advanced DJD in the left knee, most most marked in the lateral compartment. IMPRESSION: 1. No significant aortoiliac or femoral-popliteal arterial occlusive disease bilaterally. Tibial trifurcation runoff appears grossly patent, with some limitations secondary to regional venous opacification. 2. Medial distal left calf varicose veins communicating to the GSV. Given the regional symptomatology, consider ultrasound venous reflux evaluation for further characterization. 3. Bilateral pleural effusions and adjacent lower lobe consolidation/atelectasis. Electronically Signed   By: Corlis Leak  Hassell M.D.   On: 04/14/2015 14:00   Mr Ankle Left  Wo Contrast  04/15/2015  CLINICAL DATA:  Chronic ulceration and swelling. EXAM: MRI OF THE LEFT ANKLE WITHOUT CONTRAST TECHNIQUE: Multiplanar, multisequence MR imaging of the ankle was performed. No intravenous contrast was administered. COMPARISON:  None. FINDINGS: TENDONS Peroneal: Mild common peroneus tendon sheath tenosynovitis in the vicinity of the lateral malleolus. Posteromedial: Unremarkable Anterior: Unremarkable Achilles: Unremarkable Plantar Fascia:  Unremarkable LIGAMENTS Lateral: Mildly attenuated ATFL. Medial: Indistinctness of the deep tibiotalar component of the deltoid ligament. Suspected partial tearing of the medioplantar oblique portion of the spring ligament. CARTILAGE Ankle Joint: Trace tibiotalar joint effusion. Articular cartilage preserved. 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 Mild dorsal midfoot spurring. Low-level subcutaneous edema medially and laterally in the ankle and tracking along dorsum of the foot. Low-level edema tracks within along the plantar musculature of the foot. Tiny focus of metal  artifact along the skin surface of the plantar fat foot, image 11 series 7. Consider conventional radiographic correlation. This could simply be a tiny neck microscopic metallic particle of no clinical significance and there is no surrounding edema. Oval-shaped 9 by 6 mm focus of mildly increased T2 and reduced T1 signal in the proximal shaft of the third metatarsal, image 36 series 3 and image 10 series 7. IMPRESSION: 1. Subcutaneous edema both medially and laterally along the ankle, and along the dorsum of the foot. Cellulitis not excluded. No findings of osteomyelitis in the hindfoot or visualized midfoot. 2. Small focus of nonspecific increased T2 and mildly reduced T1 signal in the proximal shaft of the third metatarsal, not completely visualized, and technically nonspecific. 3. Edema tracks along the plantar musculature of the foot potentially from myositis or fasciitis. No drainable abscess identified. 4. Attenuated anterior talofibular ligament, possibly from remote injury. 5. Indistinct deep tibiotalar portion of the deltoid ligament, nonspecific but possibly from prior injury. 6. Partially torn medioplantar oblique portion of the spring ligament. 7. Mild common peroneus tendon sheath tenosynovitis. 8. 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 Electronically Signed   By: Gaylyn Rong  M.D.   On: 04/15/2015 14:07    Assessment/Plan: Aul Mangieri is a 81 y.o. male with RLS, deafness admitted with worsening LLE cellulitis despite otpt abx therapy. Was admitted previously 3/3-3/6 with same and treated with IV vanco then transitioned to oral bactrim.  He was seen in otpt setting twice last week and noted to have increased cr and lfts. Cx wound showed no growth, changed to keflex as he also developed a rash possibly to the bactrim. Admitted now with fevers and worsening redness. Also noted to have elevated LFTs but neg USS abd.  Had severe back and R side pain but now resolved with tramadol and soma. Having bad L thigh spasm.  He has been on vanco and unasyn since admit. Had initial wbc of 7.9 then increased to 11.5. Low grade fevers to 100.6 but resolving since 3/13. UA did show tntc wbc but ucx done next day is negative.  His leg seems stable and I wonder if his fever is related to his liver process and possible drug reaction. Bactrim can cause a hepatitis and a rash.  LFTS are improving   Rec Consult ortho for the L knee pain and thigh pain and the CT showing severe OA of knee.  For now continue vanco and unasyn but if improving can dc on keflex and doxy for 10 days Would also apply unaboot once improving.  Check urine for eosinophils.  CT also showed marked prostate enlargement - will need otpt fu Thank you very much for the consult. Will follow with you.  FITZGERALD, DAVID   04/15/2015, 4:43 PM

## 2015-04-15 NOTE — Progress Notes (Signed)
Pharmacy Antibiotic Note  Ronald Huerta is a 81 y.o. male admitted on 04/11/2015 with cellulitis.  Pharmacy  consulted for vancomycin and unsayn dosing. Patient has a chronic LLE ulcer and was recently treated with bactrim and cephalexin outpatient. He was recently admitted to hospital and responded to vancomycin. ID would like to continue Vancomycin and Unasyn for now but can dc on Keflex and Doxy for 10 days.    Plan:  3/13: 2nd shift PharmD changed to Vancomycin 1250 mg IV q12 hours. Will continue current regimen. Vancomycin level ordered be drawn prior to the 2230 dose on 3/14.   3/14 PM: Trough ordered for 2200, dose given at 2153. Will cancel trough since dose was administered before lab drawn. Will reorder trough for 3/15 at 0930 (before 4th dose new regimen). Asked RN to pass on to AM RN to wait for trough to be drawn before AM dose is given.  SCr has been stable.   3/15: Vancomycin infused prior to drawing trough level;  Therefore I discontinued trough level and scheduled to draw prior to the next scheduled dose. Nursing instructions placed as well.   Height: 5\' 10"  (177.8 cm) Weight: 175 lb 6.4 oz (79.561 kg) IBW/kg (Calculated) : 73. Used actual body weight of 71.7 kg  Temp (24hrs), Avg:98.3 F (36.8 C), Min:97.4 F (36.3 C), Max:99.4 F (37.4 C)   Recent Labs Lab 04/11/15 1145 04/11/15 1526 04/12/15 0346 04/13/15 0409 04/13/15 2131 04/14/15 0430 04/15/15 0459  WBC 7.9  --  10.7* 11.5*  --   --  9.3  CREATININE 1.03  --  0.99 0.88  --  0.88 0.79  LATICACIDVEN  --  0.9  --   --   --   --   --   VANCOTROUGH  --   --   --   --  5*  --   --     Estimated Creatinine Clearance: 78.6 mL/min (by C-G formula based on Cr of 0.79).    No Known Allergies  Antimicrobials this admission: vancomycin 3/11 >>   Dose adjustments this admission: Vancomycin 1250 mg IV q12 hours 3/13.   Microbiology results: 3/11 BCx: Sent  Thank you for allowing pharmacy to be a part of  this patient's care.  Demetrius CharityJames,Arlissa Monteverde D, PharmD Clinical Pharmacist 04/15/2015 9:49 AM

## 2015-04-15 NOTE — Progress Notes (Signed)
Pharmacy Antibiotic Note  Ronald Huerta is a 81 y.o. male admitted on 04/11/2015 with cellulitis.  Pharmacy  consulted for vancomycin and unsayn dosing. Patient has a chronic LLE ulcer and was recently treated with bactrim and cephalexin outpatient. He was recently admitted to hospital and responded to vancomycin. ID would like to continue Vancomycin and Unasyn for now but can dc on Keflex and Doxy for 10 days.    Plan:  3/13: 2nd shift PharmD changed to Vancomycin 1250 mg IV q12 hours. Will continue current regimen. Vancomycin level ordered be drawn prior to the 2230 dose on 3/14.   3/14 PM: Trough ordered for 2200, dose given at 2153. Will cancel trough since dose was administered before lab drawn. Will reorder trough for 3/15 at 0930 (before 4th dose new regimen). Asked RN to pass on to AM RN to wait for trough to be drawn before AM dose is given.  SCr has been stable.   3/15: Vancomycin infused prior to drawing trough level;  Therefore I discontinued trough level and scheduled to draw prior to the next scheduled dose. Nursing instructions placed as well.   3/15 vancomycin trough drawn at 21:15 (prior to the 5th dose of vancomycin 1250 IV q12h regimen) was 17 mcg/mL, slightly above goal trough of 10-15 mcg/mL. Will decrease dose to vancomycin 1000 mg IV q12h and recheck trough prior to 5th dose on 3/18.  Continue amp/sulbactam 3 g IV q 6 hours  Height: 5\' 10"  (177.8 cm) Weight: 175 lb 6.4 oz (79.561 kg) IBW/kg (Calculated) : 73. Used actual body weight of 71.7 kg  Temp (24hrs), Avg:97.6 F (36.4 C), Min:97.4 F (36.3 C), Max:97.8 F (36.6 C)   Recent Labs Lab 04/11/15 1145 04/11/15 1526 04/12/15 0346 04/13/15 0409 04/13/15 2131 04/14/15 0430 04/15/15 0459 04/15/15 2114  WBC 7.9  --  10.7* 11.5*  --   --  9.3  --   CREATININE 1.03  --  0.99 0.88  --  0.88 0.79  --   LATICACIDVEN  --  0.9  --   --   --   --   --   --   VANCOTROUGH  --   --   --   --  5*  --   --  17     Estimated Creatinine Clearance: 78.6 mL/min (by C-G formula based on Cr of 0.79).    No Known Allergies  Antimicrobials this admission: vancomycin 3/11 >>  Amp/sulbactam 3/11 >  Dose adjustments this admission: Vancomycin 1250 mg IV q12 hours 3/13.  Vancomycin 1000 mg IV q 12 hours 3/15  Microbiology results: 3/11 BCx: NGTD  Thank you for allowing pharmacy to be a part of this patient's care.  Cindi CarbonMary M Keeley Sussman, PharmD Clinical Pharmacist 04/15/2015 11:24 PM

## 2015-04-15 NOTE — Progress Notes (Signed)
[place] [facility]                                                                                                                                                                                            Patient Demographics   Ronald Huerta, is a 81 y.o. male, DOB - 1934/03/04, MOQ:[id number]  Admit date - 04/11/2015   Admitting Physician Hillary Bow, MD  Outpatient Primary MD for the patient is Tracie Harrier, MD   LOS - 4  Subjective: Subjective:  Feeling a little better. Still has pain in the thigh. The walking with a walker. Ration the leg improved   Review of Systems:   CONSTITUTIONAL: No documented fever. No fatigue, weakness. No weight gain, no weight loss.  EYES: No blurry or double vision.  ENT: No tinnitus. No postnasal drip. No redness of the oropharynx.  RESPIRATORY: No cough, no wheeze, no hemoptysis. No dyspnea.  CARDIOVASCULAR: No chest pain. No orthopnea. No palpitations. No syncope.  GASTROINTESTINAL: No nausea, no vomiting or diarrhea. No abdominal pain. No melena or hematochezia.  GENITOURINARY: No dysuria or hematuria.  ENDOCRINE: No polyuria or nocturia. No heat or cold intolerance.  HEMATOLOGY: No anemia. No bruising. No bleeding.  INTEGUMENTARY: Rash and erythema involving his leg petechial rash involving both legs MUSCULOSKELETAL: Positive hip pain, back pain NEUROLOGIC: No numbness, tingling, or ataxia. No seizure-type activity.  PSYCHIATRIC: No anxiety. No insomnia. No ADD.    Vitals:   Filed Vitals:   04/14/15 1624 04/14/15 1926 04/14/15 2201 04/15/15 0442  BP: 124/49 119/53 100/49 108/54  Pulse: 97 85  82  Temp: 99.4 F (37.4 C) 98.4 F (36.9 C) 98 F (36.7 C) 97.4 F (36.3 C)  TempSrc: Axillary Oral Oral Oral  Resp: _0 Height:      Weight:    79.561 kg (175 lb 6.4 oz)  SpO2: 92% 94% 94% 92%    Wt Readings from Last 3 Encounters:  04/15/15 79.561 kg (175 lb 6.4 oz)  04/06/15 72.031  kg (158 lb 12.8 oz)  02/10/15 75.025 kg (165 lb 6.4 oz)     Intake/Output Summary (Last 24 hours) at 04/15/15 1255 Last data filed at 04/15/15 0934  Gross per 24 hour  Intake    350 ml  Output    800 ml  Net   -450 ml    Physical Exam:   GENERAL: Pleasant-appearing in no apparent distress.  HEAD, EYES, EARS, NOSE AND THROAT: Atraumatic, normocephalic. Extraocular muscles are intact. Pupils equal and reactive to light. Sclerae anicteric. No conjunctival injection. No oro-pharyngeal erythema.  NECK: Supple.  There is no jugular venous distention. No bruits, no lymphadenopathy, no thyromegaly.  HEART: Regular rate and rhythm,. No murmurs, no rubs, no clicks.  LUNGS: Clear to auscultation bilaterally. No rales or rhonchi. No wheezes.  ABDOMEN: Soft, flat, nontender, nondistended. Has good bowel sounds. No hepatosplenomegaly appreciated.  EXTREMITIES: No evidence of any cyanosis, clubbing, or peripheral edema.  +2 pedal and radial pulses bilaterally.  NEUROLOGIC: The patient is alert, awake, and oriented x3 with no focal motor or sensory deficits appreciated bilaterally.  SKIN: Right lower extremity erythema improved , petechial rash appears to be recurring. Lower extremities  Psych: Not anxious, depressed LN: No inguinal LN enlargement    Antibiotics   Anti-infectives    Start     Dose/Rate Route Frequency Ordered Stop   04/13/15 2218  vancomycin (VANCOCIN) 1,250 mg in sodium chloride 0.9 % 250 mL IVPB     1,250 mg 166.7 mL/hr over 90 Minutes Intravenous Every 12 hours 04/13/15 2219     04/12/15 1200  Ampicillin-Sulbactam (UNASYN) 3 g in sodium chloride 0.9 % 100 mL IVPB     3 g 100 mL/hr over 60 Minutes Intravenous Every 6 hours 04/12/15 1158     04/11/15 2200  vancomycin (VANCOCIN) 1,250 mg in sodium chloride 0.9 % 250 mL IVPB  Status:  Discontinued     1,250 mg 166.7 mL/hr over 90 Minutes Intravenous Every 24 hours 04/11/15 1759 04/13/15 2219   04/11/15 1530  vancomycin  (VANCOCIN) IVPB 1000 mg/200 mL premix     1,000 mg 200 mL/hr over 60 Minutes Intravenous  Once 04/11/15 1523 04/11/15 1721      Medications   Scheduled Meds: . ampicillin-sulbactam (UNASYN) IV  3 g Intravenous Q6H  . aspirin EC  81 mg Oral Daily  . carisoprodol  350 mg Oral TID  . enoxaparin (LOVENOX) injection  40 mg Subcutaneous Q24H  . feeding supplement (ENSURE ENLIVE)  237 mL Oral BID BM  . ketorolac  15 mg Intravenous 3 times per day  . levothyroxine  25 mcg Oral Q0600  . multivitamin with minerals  1 tablet Oral Daily  . pantoprazole  40 mg Oral BID  . predniSONE  50 mg Oral Once  . psyllium  1 packet Oral Daily  . rOPINIRole  0.75 mg Oral TID  . sodium chloride  1,000 mL Intravenous Once  . terazosin  5 mg Oral Daily  . vancomycin  1,250 mg Intravenous Q12H   Continuous Infusions:  PRN Meds:.acetaminophen **OR** acetaminophen, bisacodyl, HYDROcodone-acetaminophen, ondansetron (ZOFRAN) IV, polyethylene glycol   Data Review:   Micro Results Recent Results (from the past 240 hour(s))  Blood culture (routine x 2)     Status: None (Preliminary result)   Collection Time: 04/11/15  3:25 PM  Result Value Ref Range Status   Specimen Description BLOOD LEFT ARM  Final   Special Requests BOTTLES DRAWN AEROBIC AND ANAEROBIC [place]  Final   Culture NO GROWTH 4 DAYS  Final   Report Status PENDING  Incomplete  Rapid Influenza A&B Antigens ([place] only)     Status: None   Collection Time: 04/11/15  3:26 PM  Result Value Ref Range Status   Influenza A ([facility]) NEGATIVE NEGATIVE Final   Influenza B ([facility]) NEGATIVE NEGATIVE Final  Blood culture (routine x 2)     Status: None (Preliminary result)   Collection Time: 04/11/15  3:30 PM  Result Value Ref Range Status   Specimen Description BLOOD RIGHT ARM  Final   Special Requests BOTTLES  DRAWN AEROBIC AND ANAEROBIC [place]  Final   Culture NO GROWTH 4 DAYS  Final   Report Status PENDING  Incomplete  Urine culture     Status: None    Collection Time: 04/12/15  5:06 PM  Result Value Ref Range Status   Specimen Description URINE, RANDOM  Final   Special Requests Normal  Final   Culture NO GROWTH 2 DAYS  Final   Report Status 04/14/2015 FINAL  Final    Radiology Reports Ct Angio Ao+bifem W/cm &/or Wo/cm  04/14/2015  CLINICAL DATA:  Pt. C/o left thigh pain and lower back pain. Pt. Also has worsening petechiae on left leg as well as cellulitis of left foot. Pt. Has a hx of chronic lower extremity cellulitis. No hx of CA. EXAM: CT ANGIOGRAPHY ABDOMEN AND PELVIS CT ANGIOGRAPHY BILATERAL LOWER EXTREMITIES TECHNIQUE: Multidetector CT imaging of the abdomen and pelvis was performed using the standard protocol during bolus administration of intravenous contrast. Axial helical CT of bilateral lower extremities after power injection of intravenous contrast. Coronal and sagittal reconstructions were generated for vascular evaluation. Multiplanar reconstructed images including MIPs were obtained and reviewed to evaluate the vascular anatomy. CONTRAST:  169m OMNIPAQUE IOHEXOL 350 MG/ML SOLN COMPARISON:  COMPARISON 03/02/2010 FINDINGS: ARTERIAL Aorta: Scattered atheromatous plaque. No aneurysm, dissection, or stenosis. Celiac axis: Mild short-segment stenosis of the level of the median arcuate ligament of the diaphragm, patent distally. Superior mesenteric:  Patent, classic distal branch anatomy. Left renal:           Single, patent Right renal:          Single, patent. Inferior mesenteric: Patent. There is a diminutive accessory visceral branch arising just cephalad to the IMA. Left iliac:           Patent Right iliac:          Patent Left lower extremity: Patent common femoral, deep femoral, SFA, popliteal arteries. Patent 3 vessel tibial runoff, with some limitation of assessment secondary to extensive venous opacification. Right lower extremity: Patent common femoral, deep femoral, SFA, and popliteal arteries. Patent trifurcation runoff, somewhat  limited assessment due to extensive venous opacification. VENOUS: Dedicated venous phase imaging of the abdomen was not obtained. Early opacification of bilateral renal veins noted. Extensive calf and saphenous venous opacification below the knee bilaterally. Subcutaneous varicose veins communicating to the GSV in the medial distal left calf. Review of the MIP images confirms the above findings. NONVASCULAR Lower chest: Small pleural effusions. Consolidation or dependent atelectasis posteriorly in the visualized lower lobes, new since previous. No pericardial effusion. Hepatobiliary: No masses or other significant abnormality. Cholecystectomy clips. Pancreas: No mass, inflammatory changes, or other significant abnormality. Spleen: Within normal limits in size and appearance. Adrenals/Urinary Tract: No masses identified. No evidence of hydronephrosis. Stomach/Bowel: No evidence of obstruction, inflammatory process, or abnormal fluid collections. Lymphatic: No pathologically enlarged lymph nodes. Reproductive: Marked prostatic enlargement protruding into the lumen of the urinary bladder, with central coarse calcifications. Other: Trace free fluid in the pelvis.  No free air. Musculoskeletal: Mild spondylitic changes in the lower thoracic and lumbar spine. Healed fracture deformity of the left femur. Advanced DJD in the left knee, most most marked in the lateral compartment. IMPRESSION: 1. No significant aortoiliac or femoral-popliteal arterial occlusive disease bilaterally. Tibial trifurcation runoff appears grossly patent, with some limitations secondary to regional venous opacification. 2. Medial distal left calf varicose veins communicating to the GSV. Given the regional symptomatology, consider ultrasound venous reflux evaluation for further characterization. 3. Bilateral  pleural effusions and adjacent lower lobe consolidation/atelectasis. Electronically Signed   By: Lucrezia Europe M.D.   On: 04/14/2015 14:00   US  Venous Img Lower Unilateral Left  03/24/2015  CLINICAL DATA:  Left leg swelling for 3 weeks EXAM: Left LOWER EXTREMITY VENOUS DOPPLER ULTRASOUND TECHNIQUE: Gray-scale sonography with graded compression, as well as color Doppler and duplex ultrasound were performed to evaluate the lower extremity deep venous systems from the level of the common femoral vein and including the common femoral, femoral, profunda femoral, popliteal and calf veins including the posterior tibial, peroneal and gastrocnemius veins when visible. The superficial great saphenous vein was also interrogated. Spectral Doppler was utilized to evaluate flow at rest and with distal augmentation maneuvers in the common femoral, femoral and popliteal veins. COMPARISON:  None. FINDINGS: Contralateral Common Femoral Vein: Respiratory phasicity is normal and symmetric with the symptomatic side. No evidence of thrombus. Normal compressibility. Common Femoral Vein: No evidence of thrombus. Normal compressibility, respiratory phasicity and response to augmentation. Saphenofemoral Junction: No evidence of thrombus. Normal compressibility and flow on color Doppler imaging. Profunda Femoral Vein: No evidence of thrombus. Normal compressibility and flow on color Doppler imaging. Femoral Vein: No evidence of thrombus. Normal compressibility, respiratory phasicity and response to augmentation. Popliteal Vein: No evidence of thrombus. Normal compressibility, respiratory phasicity and response to augmentation. Calf Veins: No evidence of thrombus. Normal compressibility and flow on color Doppler imaging. Superficial Great Saphenous Vein: No evidence of thrombus. Normal compressibility and flow on color Doppler imaging. Venous Reflux:  None. Other Findings: There is enlarged lymph node in left inguinal region measures 3.2 x 2.4 cm probable reactive. The technologist reports left left ankle swelling and redness and skin wound. Clinical correlation is necessary.  IMPRESSION: No evidence of deep venous thrombosis left lower extremity.There is enlarged lymph node in left inguinal region measures 3.2 x 2.4 cm probable reactive. The technologist reports left left ankle swelling and redness and skin wound. Clinical correlation is necessary. Electronically Signed   By: Lahoma Crocker M.D.   On: 03/24/2015 15:16   Dg Chest Portable 1 View  04/11/2015  CLINICAL DATA:  81 year old male with history of vomiting since this morning. Currently on antibiotics for cellulitis of the left leg. No current abdominal pain, diarrhea, chest pain or cough. EXAM: PORTABLE CHEST 1 VIEW COMPARISON:  Chest CT 04/09/2008. FINDINGS: Mild elevation of the right hemidiaphragm is unchanged. Lung volumes are normal. No consolidative airspace disease. No pleural effusions. No pneumothorax. No pulmonary nodule or mass noted. Pulmonary vasculature and the cardiomediastinal silhouette are within normal limits. IMPRESSION: 1. No radiographic evidence of acute cardiopulmonary disease. Electronically Signed   By: Vinnie Langton M.D.   On: 04/11/2015 15:42   Dg Femur Min 2 Views Left  04/11/2015  CLINICAL DATA:  Cellulitis and chronic left lower leg ulcer. Pain all over mid thigh. EXAM: LEFT FEMUR 2 VIEWS COMPARISON:  None. FINDINGS: Posttraumatic deformity of the femoral diaphysis is evident. Overlying soft tissues are unremarkable. Advanced degenerative changes are seen at the knee. IMPRESSION: Posttraumatic deformity mid femur with advanced osteoarthritis of the knee. Electronically Signed   By: Misty Stanley M.D.   On: 04/11/2015 20:30   US Abdomen Limited Ruq  04/11/2015  CLINICAL DATA:  Liver function abnormality. Elevated AST, ALT and alk-phos. Nausea and vomiting. EXAM: US ABDOMEN LIMITED - RIGHT UPPER QUADRANT COMPARISON:  CT 03/02/2010 FINDINGS: Gallbladder: Surgically absent. Common bile duct: Diameter: 6 mm, normal. Liver: No focal lesion identified. Within normal limits in  parenchymal  echogenicity. Mild central intrahepatic biliary ductal dilatation. IMPRESSION: 1. Postcholecystectomy. 2. Mild intrahepatic biliary ductal dilatation with normal caliber common bile duct, likely sequela of prior cholecystectomy. No sonographic evidence of central obstructing mass. 3. Otherwise normal sonographic appearance of the liver. Electronically Signed   By: Jeb Levering M.D.   On: 04/11/2015 18:01     CBC  Recent Labs Lab 04/11/15 1145 04/12/15 0346 04/13/15 0409 04/15/15 0459  WBC 7.9 10.7* 11.5* 9.3  HGB 12.1* 11.1* 10.8* 10.5*  HCT 35.7* 32.3* 30.9* 30.9*  PLT 148* 123* 127* 184  MCV 95.4 95.5 94.2 94.8  MCH 32.3 33.0 32.9 32.3  MCHC 33.8 34.5 34.9 34.0  RDW 14.0 13.7 14.0 13.9    Chemistries   Recent Labs Lab 04/11/15 1145 04/12/15 0346 04/13/15 0409 04/14/15 0430 04/15/15 0459  NA 132* 132* 135 133* 134*  K 3.9 4.1 3.8 4.0 3.9  CL 100* 98* 101 99* 99*  CO2 _0 GLUCOSE 106* 103* 110* 131* 186*  BUN _1 CREATININE 1.03 0.99 0.88 0.88 0.79  CALCIUM 8.6* 8.1* 8.3* 8.2* 8.2*  AST 316* 247* 106*  --  39  ALT 327* 280* 181*  --  93*  ALKPHOS 354* 372* 296*  --  231*  BILITOT 1.2 2.9* 1.4*  --  0.8   ------------------------------------------------------------------------------------------------------------------ estimated creatinine clearance is 78.6 mL/min (by C-G formula based on Cr of 0.79). ------------------------------------------------------------------------------------------------------------------ No results for input(s): HGBA1C in the last 72 hours. ------------------------------------------------------------------------------------------------------------------ No results for input(s): CHOL, HDL, LDLCALC, TRIG, CHOLHDL, LDLDIRECT in the last 72 hours. ------------------------------------------------------------------------------------------------------------------ No results for input(s): TSH, T4TOTAL, T3FREE, THYROIDAB in  the last 72 hours.  Invalid input(s): FREET3 ------------------------------------------------------------------------------------------------------------------ No results for input(s): VITAMINB12, FOLATE, FERRITIN, TIBC, IRON, RETICCTPCT in the last 72 hours.  Coagulation profile  Recent Labs Lab 04/11/15 1726  INR 1.15    No results for input(s): DDIMER in the last 72 hours.  Cardiac Enzymes  Recent Labs Lab 04/11/15 1145  TROPONINI <0.03   ------------------------------------------------------------------------------------------------------------------ Invalid input(s): POCBNP    Assessment & Plan  Patient is a 81 year old white male with recurrent lower extremity cellulitis   1. Left leg cellulitis,  Patient's symptoms are improving. Continue current antibiotics   2. Petechial rash: CT angiogram of the lower extremity negative.  I have given patient prednisone yesterday he seems to have responded to that  3.  Elevated liver function tests Hepatitis panel negative no evidence of autoimmune hepatitis will need outpatient LFTs improving  4. Restless leg syndrome  continue Requip  5. Hypothyroidism continue Synthroid  6. Muscle pain in his left thigh I will continue Soma   * DVT prophylaxis with Lovenox  All the records are reviewed and case discussed with ED provider. Management plans discussed with the patient, family and they are in agreement.      Code Status Orders        Start     Ordered   04/11/15 1702  Full code   Continuous     04/11/15 1702    Code Status History    Date Active Date Inactive Code Status Order ID Comments User Context   04/03/2015 12:40 PM 04/06/2015  3:24 PM Full Code [id number]  Hillary Bow, MD Inpatient           Consults ID  DVT Prophylaxis  Lovenox  Lab Results  Component Value Date   PLT 184 04/15/2015     Time Spent in  minutes   40mn  PDustin FlockM.D on 04/15/2015 at 12:55 PM  Between 7am to 6pm -  Pager - [phone]  After 6pm go to [email or web address] - password [email or web address]   Office  [phone]

## 2015-04-16 ENCOUNTER — Inpatient Hospital Stay: Payer: Medicare Other

## 2015-04-16 LAB — CBC
HCT: 28.7 % — ABNORMAL LOW (ref 40.0–52.0)
HEMOGLOBIN: 9.8 g/dL — AB (ref 13.0–18.0)
MCH: 32.5 pg (ref 26.0–34.0)
MCHC: 34.2 g/dL (ref 32.0–36.0)
MCV: 95.1 fL (ref 80.0–100.0)
PLATELETS: 204 10*3/uL (ref 150–440)
RBC: 3.02 MIL/uL — AB (ref 4.40–5.90)
RDW: 14.1 % (ref 11.5–14.5)
WBC: 10.5 10*3/uL (ref 3.8–10.6)

## 2015-04-16 LAB — CULTURE, BLOOD (ROUTINE X 2)
CULTURE: NO GROWTH
Culture: NO GROWTH

## 2015-04-16 LAB — BASIC METABOLIC PANEL
Anion gap: 5 (ref 5–15)
BUN: 17 mg/dL (ref 6–20)
CHLORIDE: 99 mmol/L — AB (ref 101–111)
CO2: 30 mmol/L (ref 22–32)
CREATININE: 0.84 mg/dL (ref 0.61–1.24)
Calcium: 8.2 mg/dL — ABNORMAL LOW (ref 8.9–10.3)
Glucose, Bld: 127 mg/dL — ABNORMAL HIGH (ref 65–99)
POTASSIUM: 4.4 mmol/L (ref 3.5–5.1)
SODIUM: 134 mmol/L — AB (ref 135–145)

## 2015-04-16 MED ORDER — PREDNISOLONE 5 MG PO TABS: 50.0000 mg | ORAL_TABLET | Freq: Once | ORAL | Status: DC

## 2015-04-16 MED ORDER — PREDNISONE 50 MG PO TABS
50.0000 mg | ORAL_TABLET | Freq: Once | ORAL | Status: AC
  Administered 2015-04-16: 50 mg via ORAL
  Filled 2015-04-16: qty 1

## 2015-04-16 MED ORDER — TERAZOSIN HCL 2 MG PO CAPS
2.0000 mg | ORAL_CAPSULE | Freq: Every day | ORAL | Status: DC
  Administered 2015-04-16 – 2015-04-17 (×2): 2 mg via ORAL
  Filled 2015-04-16 (×3): qty 1

## 2015-04-16 NOTE — Consult Note (Signed)
Orthopaedics-  Attempted to interview and examine patient (using writing tablet to communicate). Patient tripped and fell on a cord approximately 4 months ago, landing on his left side. He describes pain primarily to the mid thigh with some radiation of pain extending from the lower back down the leg all the way to the ankle. He also reports a "pins and needles" sensation in the leg. He apparently had similar symptoms involving the right lower extremity approximately a year ago.  Review of his left femur radiographs demonstrate an old, well-healed midshaft femur fracture as well as severe degenerative changes to the left knee. However, the patient's symptoms are more consistent with a lumbar radiculitis. I will obtain radiographs of the lumbar spine. I will attempt to reevaluate the patient with an interpreter.  James P. Angie FavaHooten, Jr. M.D.

## 2015-04-16 NOTE — Care Management Note (Signed)
Case Management Note  Patient Details  Name: Ronald Huerta MRN: [id number] Date of Birth: Jul 06, 1934  Subjective/Objective:                   Met with patient, signer/interpreter, daughter (who was able to hear some and speak), Georga Hacking, and Colletta [place] PT to discuss home health and DME needs. Patient has a standard walker available in the home however he was not using it. PT is recommending front-wheeled rolling walker and home health PT. Patient/daughter agrees however they are concerned about cost. His PCP is Dr. Ginette Pitman at [facility]. Action/Plan: List of home health agencies left with patient to discuss with daughter. Rolling walker [facility] from [facility]. Need Rx for rolling walker.   Expected Discharge Date:                  Expected Discharge Plan:     In-House Referral:     Discharge planning Services  CM Consult  Post Acute Care Choice:  Durable Medical Equipment, Home Health Choice offered to:  Patient  DME Arranged:  Walker rolling DME Agency:  [place]:  PT [facility] Agency:     Status of Service:  In process, will continue to follow  Medicare Important Message Given:  Yes Date Medicare IM Given:    Medicare IM give by:    Date Additional Medicare IM Given:    Additional Medicare Important Message give by:     If discussed at [place] of Stay Meetings, dates discussed:    Additional Comments:  Marshell Garfinkel, RN 04/16/2015, 10:58 AM

## 2015-04-16 NOTE — Evaluation (Signed)
Physical Therapy Evaluation Patient Details Name: Ronald Huerta MRN: [id number] DOB: Sep 28, 1934 Today's Date: 04/16/2015   History of Present Illness  Pt admitted for cellulitis and edema on L ankle. Pt with previous injury 4 months ago with fall and landing on L side. Pt with recent complaints of pain on L side and redness. Pt with history of restless leg and is deaf.   Clinical Impression  Pt is a pleasant 81 year old male who was admitted for cellulitis with complaints of pain/redness on L LE. L LE appears slightly swollen, however does not limit pt with OOB mobility. Sign language interpreter used for evaluation. Pt performs bed mobility/transfers with supervision and ambulation with cga and rw. Recommend use of rw at this time for further distances as it improves balance and decreases fall risk.  Pt demonstrates deficits with balance/pain/strength. Would benefit from skilled PT to address above deficits and promote optimal return to PLOF.      Follow Up Recommendations Home health PT    Equipment Recommendations  Rolling walker with 5" wheels    Recommendations for Other Services       Precautions / Restrictions Precautions Precautions: Fall Restrictions Weight Bearing Restrictions: No      Mobility  Bed Mobility Overal bed mobility: Needs Assistance Bed Mobility: Supine to Sit     Supine to sit: Supervision     General bed mobility comments: safe technique performed. Pt used bed rail to assist. Once seated at EOB, pt able to sit with no LOB  Transfers Overall transfer level: Needs assistance Equipment used: Rolling walker (2 wheeled) Transfers: Sit to/from Stand Sit to Stand: Supervision         General transfer comment: safe technique performed with cues for pushing from seated surface. RW used for transfer. Slightly flexed posture noted once standing  Ambulation/Gait Ambulation/Gait assistance: Min guard Ambulation Distance (Feet): 70  Feet Assistive device: Rolling walker (2 wheeled) Gait Pattern/deviations: Step-through pattern     General Gait Details: ambulated using rw demonstrating reciprocal gait pattern with symmetrical step. Steady gait with no LOB. Good speed noted. Further gait training located in exercises  Stairs            Wheelchair Mobility    Modified Rankin (Stroke Patients Only)       Balance Overall balance assessment: History of Falls;Needs assistance Sitting-balance support: Feet supported Sitting balance-Leahy Scale: Normal     Standing balance support: Bilateral upper extremity supported Standing balance-Leahy Scale: Good                               Pertinent Vitals/Pain Pain Assessment: No/denies pain    Home Living Family/patient expects to be discharged to:: Private residence Living Arrangements: Children Available Help at Discharge: Family;Available 24 hours/day Type of Home: House Home Access: Stairs to enter   [employer] Corporation[employer]trance Stairs-Number of Steps: 1 Home Layout: Able to live on main level with bedroom/bathroom Home Equipment: Walker - standard;Cane - single point      Prior Function Level of Independence: Independent         Comments: has recently been using SPC     Hand Dominance        Extremity/Trunk Assessment   Upper Extremity Assessment: Overall WFL for tasks assessed           Lower Extremity Assessment: Generalized weakness (grossly 4/5)         Communication   Communication: Interpreter  utilized (sign language)  Cognition Arousal/Alertness: Awake/alert Behavior During Therapy: WFL for tasks assessed/performed Overall Cognitive Status: Within Functional Limits for tasks assessed                      General Comments      Exercises Other Exercises Other Exercises: Pt performed further ambulation in hallway without RW. Pt demonstrates slight unsteadiness, however no overt LOB. Shorter step length noted and  forward flexed posture. Needs CGA for ambulation without RW      Assessment/Plan    PT Assessment Patient needs continued PT services  PT Diagnosis Difficulty walking;Generalized weakness   PT Problem List Decreased strength;Decreased balance;Decreased mobility;Decreased knowledge of use of DME  PT Treatment Interventions Gait training;Therapeutic exercise   PT Goals (Current goals can be found in the Care Plan section) Acute Rehab PT Goals Patient Stated Goal: to get stronger PT Goal Formulation: With patient Time For Goal Achievement: 04/30/15 Potential to Achieve Goals: Good    Frequency Min 2X/week   Barriers to discharge        Co-evaluation               End of Session Equipment Utilized During Treatment: Gait belt Activity Tolerance: Patient tolerated treatment well Patient left: in bed;with nursing/sitter in room Nurse Communication: Mobility status         Time: 1030-1101 PT Time Calculation (min) (ACUTE ONLY): 31 min   Charges:   PT Evaluation $PT Eval Moderate Complexity: 1 Procedure PT Treatments $Gait Training: 8-22 mins   PT G Codes:       Elizabeth Palau, PT, DPT [phone]  Ronald Huerta 04/16/2015, 2:47 PM

## 2015-04-16 NOTE — Plan of Care (Addendum)
Translator and daughter arrived at pt room about 1045.  While here, Gen Dr., Gaylord Shihrtho, case management and  PT all had time to spend with pt and assess.  ID also informed, but was unable to see pt during this time frame. RN performed baladder scan while translator was available. * *Radiology attempted to transport pt for scan, but RN asked them to hold off since translator was here.  Radiology will be contacted to come get pt once translator is finished with all other staff.

## 2015-04-16 NOTE — Progress Notes (Signed)
[facility]                                                                                                                                                                                            Patient Demographics   Ronald Huerta, is a 81 y.o. male, DOB - Apr 22, 1934, UXN:[id number]  Admit date - 04/11/2015   Admitting Physician Hillary Bow, MD  Outpatient Primary MD for the patient is HANDE,VISHWANATH, MD   LOS - 5  Subjective: Subjective:  Patient feeling little better the petechial rash is fading. Swelling of the lower extremities improved and erythema is improved. He had an MRI which showed some myositis around the cellulitis area. He plains of pain in the left thigh region  Review of Systems:   CONSTITUTIONAL: No documented fever. No fatigue, weakness. No weight gain, no weight loss.  EYES: No blurry or double vision.  ENT: No tinnitus. No postnasal drip. No redness of the oropharynx.  RESPIRATORY: No cough, no wheeze, no hemoptysis. No dyspnea.  CARDIOVASCULAR: No chest pain. No orthopnea. No palpitations. No syncope.  GASTROINTESTINAL: No nausea, no vomiting or diarrhea. No abdominal pain. No melena or hematochezia.  GENITOURINARY: No dysuria or hematuria.  ENDOCRINE: No polyuria or nocturia. No heat or cold intolerance.  HEMATOLOGY: No anemia. No bruising. No bleeding.  INTEGUMENTARY: Rash and erythema involving his leg petechial rash involving both legs improving MUSCULOSKELETAL: Positive hip pain, back pain NEUROLOGIC: No numbness, tingling, or ataxia. No seizure-type activity.  PSYCHIATRIC: No anxiety. No insomnia. No ADD.    Vitals:   Filed Vitals:   04/16/15 0336 04/16/15 0344 04/16/15 0500 04/16/15 0801  BP: 84/41 106/66  130/63  Pulse: 67 70  87  Temp: 98 F (36.7 C)   97.8 F (36.6 C)  TempSrc: Oral   Oral  Resp: 17   18  Height:      Weight:   80.74 kg (178 lb)   SpO2: 91%   92%    Wt Readings  from Last 3 Encounters:  04/16/15 80.74 kg (178 lb)  04/06/15 72.031 kg (158 lb 12.8 oz)  02/10/15 75.025 kg (165 lb 6.4 oz)     Intake/Output Summary (Last 24 hours) at 04/16/15 1028 Last data filed at 04/16/15 0900  Gross per 24 hour  Intake    660 ml  Output   1600 ml  Net   -940 ml    Physical Exam:   GENERAL: Pleasant-appearing in no apparent distress.  HEAD, EYES, EARS, NOSE AND THROAT: Atraumatic, normocephalic. Extraocular muscles are intact. Pupils equal and reactive to light. Sclerae  anicteric. No conjunctival injection. No oro-pharyngeal erythema.  NECK: Supple. There is no jugular venous distention. No bruits, no lymphadenopathy, no thyromegaly.  HEART: Regular rate and rhythm,. No murmurs, no rubs, no clicks.  LUNGS: Clear to auscultation bilaterally. No rales or rhonchi. No wheezes.  ABDOMEN: Soft, flat, nontender, nondistended. Has good bowel sounds. No hepatosplenomegaly appreciated.  EXTREMITIES: No evidence of any cyanosis, clubbing, or peripheral edema.  +2 pedal and radial pulses bilaterally.  NEUROLOGIC: The patient is alert, awake, and oriented x3 with no focal motor or sensory deficits appreciated bilaterally.  SKIN: Right lower extremity erythema improved , petechial rash appears to be resolved Lower extremities  Psych: Not anxious, depressed LN: No inguinal LN enlargement    Antibiotics   Anti-infectives    Start     Dose/Rate Route Frequency Ordered Stop   04/16/15 1030  vancomycin (VANCOCIN) IVPB 1000 mg/200 mL premix     1,000 mg 200 mL/hr over 60 Minutes Intravenous Every 12 hours 04/15/15 2322     04/13/15 2218  vancomycin (VANCOCIN) 1,250 mg in sodium chloride 0.9 % 250 mL IVPB  Status:  Discontinued     1,250 mg 166.7 mL/hr over 90 Minutes Intravenous Every 12 hours 04/13/15 2219 04/15/15 2322   04/12/15 1200  Ampicillin-Sulbactam (UNASYN) 3 g in sodium chloride 0.9 % 100 mL IVPB     3 g 100 mL/hr over 60 Minutes Intravenous Every 6 hours  04/12/15 1158     04/11/15 2200  vancomycin (VANCOCIN) 1,250 mg in sodium chloride 0.9 % 250 mL IVPB  Status:  Discontinued     1,250 mg 166.7 mL/hr over 90 Minutes Intravenous Every 24 hours 04/11/15 1759 04/13/15 2219   04/11/15 1530  vancomycin (VANCOCIN) IVPB 1000 mg/200 mL premix     1,000 mg 200 mL/hr over 60 Minutes Intravenous  Once 04/11/15 1523 04/11/15 1721      Medications   Scheduled Meds: . ampicillin-sulbactam (UNASYN) IV  3 g Intravenous Q6H  . aspirin EC  81 mg Oral Daily  . carisoprodol  350 mg Oral TID  . enoxaparin (LOVENOX) injection  40 mg Subcutaneous Q24H  . feeding supplement (ENSURE ENLIVE)  237 mL Oral BID BM  . ketorolac  15 mg Intravenous 3 times per day  . levothyroxine  25 mcg Oral Q0600  . multivitamin with minerals  1 tablet Oral Daily  . pantoprazole  40 mg Oral BID  . prednisoLONE  50 mg Oral Once  . psyllium  1 packet Oral Daily  . rOPINIRole  0.75 mg Oral TID  . sodium chloride  1,000 mL Intravenous Once  . tamsulosin  0.4 mg Oral Daily  . terazosin  5 mg Oral Daily  . vancomycin  1,000 mg Intravenous Q12H   Continuous Infusions:  PRN Meds:.acetaminophen **OR** acetaminophen, bisacodyl, HYDROcodone-acetaminophen, ondansetron (ZOFRAN) IV, polyethylene glycol   Data Review:   Micro Results Recent Results (from the past 240 hour(s))  Blood culture (routine x 2)     Status: None   Collection Time: 04/11/15  3:25 PM  Result Value Ref Range Status   Specimen Description BLOOD LEFT ARM  Final   Special Requests BOTTLES DRAWN AEROBIC AND ANAEROBIC [place]  Final   Culture NO GROWTH 5 DAYS  Final   Report Status 04/16/2015 FINAL  Final  Rapid Influenza A&B Antigens ([place] only)     Status: None   Collection Time: 04/11/15  3:26 PM  Result Value Ref Range Status   Influenza A ([facility]) NEGATIVE  NEGATIVE Final   Influenza B ([facility]) NEGATIVE NEGATIVE Final  Blood culture (routine x 2)     Status: None   Collection Time: 04/11/15  3:30 PM  Result  Value Ref Range Status   Specimen Description BLOOD RIGHT ARM  Final   Special Requests BOTTLES DRAWN AEROBIC AND ANAEROBIC [place]  Final   Culture NO GROWTH 5 DAYS  Final   Report Status 04/16/2015 FINAL  Final  Urine culture     Status: None   Collection Time: 04/12/15  5:06 PM  Result Value Ref Range Status   Specimen Description URINE, RANDOM  Final   Special Requests Normal  Final   Culture NO GROWTH 2 DAYS  Final   Report Status 04/14/2015 FINAL  Final    Radiology Reports Ct Angio Ao+bifem W/cm &/or Wo/cm  04/14/2015  CLINICAL DATA:  Pt. C/o left thigh pain and lower back pain. Pt. Also has worsening petechiae on left leg as well as cellulitis of left foot. Pt. Has a hx of chronic lower extremity cellulitis. No hx of CA. EXAM: CT ANGIOGRAPHY ABDOMEN AND PELVIS CT ANGIOGRAPHY BILATERAL LOWER EXTREMITIES TECHNIQUE: Multidetector CT imaging of the abdomen and pelvis was performed using the standard protocol during bolus administration of intravenous contrast. Axial helical CT of bilateral lower extremities after power injection of intravenous contrast. Coronal and sagittal reconstructions were generated for vascular evaluation. Multiplanar reconstructed images including MIPs were obtained and reviewed to evaluate the vascular anatomy. CONTRAST:  189m OMNIPAQUE IOHEXOL 350 MG/ML SOLN COMPARISON:  COMPARISON 03/02/2010 FINDINGS: ARTERIAL Aorta: Scattered atheromatous plaque. No aneurysm, dissection, or stenosis. Celiac axis: Mild short-segment stenosis of the level of the median arcuate ligament of the diaphragm, patent distally. Superior mesenteric:  Patent, classic distal branch anatomy. Left renal:           Single, patent Right renal:          Single, patent. Inferior mesenteric: Patent. There is a diminutive accessory visceral branch arising just cephalad to the IMA. Left iliac:           Patent Right iliac:          Patent Left lower extremity: Patent common femoral, deep femoral, SFA,  popliteal arteries. Patent 3 vessel tibial runoff, with some limitation of assessment secondary to extensive venous opacification. Right lower extremity: Patent common femoral, deep femoral, SFA, and popliteal arteries. Patent trifurcation runoff, somewhat limited assessment due to extensive venous opacification. VENOUS: Dedicated venous phase imaging of the abdomen was not obtained. Early opacification of bilateral renal veins noted. Extensive calf and saphenous venous opacification below the knee bilaterally. Subcutaneous varicose veins communicating to the GSV in the medial distal left calf. Review of the MIP images confirms the above findings. NONVASCULAR Lower chest: Small pleural effusions. Consolidation or dependent atelectasis posteriorly in the visualized lower lobes, new since previous. No pericardial effusion. Hepatobiliary: No masses or other significant abnormality. Cholecystectomy clips. Pancreas: No mass, inflammatory changes, or other significant abnormality. Spleen: Within normal limits in size and appearance. Adrenals/Urinary Tract: No masses identified. No evidence of hydronephrosis. Stomach/Bowel: No evidence of obstruction, inflammatory process, or abnormal fluid collections. Lymphatic: No pathologically enlarged lymph nodes. Reproductive: Marked prostatic enlargement protruding into the lumen of the urinary bladder, with central coarse calcifications. Other: Trace free fluid in the pelvis.  No free air. Musculoskeletal: Mild spondylitic changes in the lower thoracic and lumbar spine. Healed fracture deformity of the left femur. Advanced DJD in the left knee, most most marked in the lateral  compartment. IMPRESSION: 1. No significant aortoiliac or femoral-popliteal arterial occlusive disease bilaterally. Tibial trifurcation runoff appears grossly patent, with some limitations secondary to regional venous opacification. 2. Medial distal left calf varicose veins communicating to the GSV. Given the  regional symptomatology, consider ultrasound venous reflux evaluation for further characterization. 3. Bilateral pleural effusions and adjacent lower lobe consolidation/atelectasis. Electronically Signed   By: Lucrezia Europe M.D.   On: 04/14/2015 14:00   Mr Ankle Left  Wo Contrast  04/15/2015  CLINICAL DATA:  Chronic ulceration and swelling. EXAM: MRI OF THE LEFT ANKLE WITHOUT CONTRAST TECHNIQUE: Multiplanar, multisequence MR imaging of the ankle was performed. No intravenous contrast was administered. COMPARISON:  None. FINDINGS: TENDONS Peroneal: Mild common peroneus tendon sheath tenosynovitis in the vicinity of the lateral malleolus. Posteromedial: Unremarkable Anterior: Unremarkable Achilles: Unremarkable Plantar Fascia: Unremarkable LIGAMENTS Lateral: Mildly attenuated ATFL. Medial: Indistinctness of the deep tibiotalar component of the deltoid ligament. Suspected partial tearing of the medioplantar oblique portion of the spring ligament. CARTILAGE Ankle Joint: Trace tibiotalar joint effusion. Articular cartilage preserved. 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 Mild dorsal midfoot spurring. Low-level subcutaneous edema medially and laterally in the ankle and tracking along dorsum of the foot. Low-level edema tracks within along the plantar musculature of the foot. Tiny focus of metal artifact along the skin surface of the plantar fat foot, image 11 series 7. Consider conventional radiographic correlation. This could simply be a tiny neck microscopic metallic particle of no clinical significance and there is no surrounding edema. Oval-shaped 9 by 6 mm focus of mildly increased T2 and reduced T1 signal in the proximal shaft of the third metatarsal, image 36 series 3 and image 10 series 7. IMPRESSION: 1. Subcutaneous edema both medially and laterally along the ankle, and along the dorsum of the foot. Cellulitis not excluded. No findings of osteomyelitis in the  hindfoot or visualized midfoot. 2. Small focus of nonspecific increased T2 and mildly reduced T1 signal in the proximal shaft of the third metatarsal, not completely visualized, and technically nonspecific. 3. Edema tracks along the plantar musculature of the foot potentially from myositis or fasciitis. No drainable abscess identified. 4. Attenuated anterior talofibular ligament, possibly from remote injury. 5. Indistinct deep tibiotalar portion of the deltoid ligament, nonspecific but possibly from prior injury. 6. Partially torn medioplantar oblique portion of the spring ligament. 7. Mild common peroneus tendon sheath tenosynovitis. 8. 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 Electronically Signed   By: Van Clines M.D.   On: 04/15/2015 14:07   US Venous Img Lower Unilateral Left  03/24/2015  CLINICAL DATA:  Left leg swelling for 3 weeks EXAM: Left LOWER EXTREMITY VENOUS DOPPLER ULTRASOUND TECHNIQUE: Gray-scale sonography with graded compression, as well as color Doppler and duplex ultrasound were performed to evaluate the lower extremity deep venous systems from the level of the common femoral vein and including the common femoral, femoral, profunda femoral, popliteal and calf veins including the posterior tibial, peroneal and gastrocnemius veins when visible. The superficial great saphenous vein was also interrogated. Spectral Doppler was utilized to evaluate flow at rest and with distal augmentation maneuvers in the common femoral, femoral and popliteal veins. COMPARISON:  None. FINDINGS: Contralateral Common Femoral Vein: Respiratory phasicity is normal and symmetric with the symptomatic side. No evidence of thrombus. Normal compressibility. Common Femoral Vein: No evidence of thrombus. Normal compressibility, respiratory phasicity and response to augmentation. Saphenofemoral Junction: No evidence of thrombus. Normal compressibility and flow on color Doppler imaging. Profunda Femoral  Vein: No  evidence of thrombus. Normal compressibility and flow on color Doppler imaging. Femoral Vein: No evidence of thrombus. Normal compressibility, respiratory phasicity and response to augmentation. Popliteal Vein: No evidence of thrombus. Normal compressibility, respiratory phasicity and response to augmentation. Calf Veins: No evidence of thrombus. Normal compressibility and flow on color Doppler imaging. Superficial Great Saphenous Vein: No evidence of thrombus. Normal compressibility and flow on color Doppler imaging. Venous Reflux:  None. Other Findings: There is enlarged lymph node in left inguinal region measures 3.2 x 2.4 cm probable reactive. The technologist reports left left ankle swelling and redness and skin wound. Clinical correlation is necessary. IMPRESSION: No evidence of deep venous thrombosis left lower extremity.There is enlarged lymph node in left inguinal region measures 3.2 x 2.4 cm probable reactive. The technologist reports left left ankle swelling and redness and skin wound. Clinical correlation is necessary. Electronically Signed   By: Lahoma Crocker M.D.   On: 03/24/2015 15:16   Dg Chest Portable 1 View  04/11/2015  CLINICAL DATA:  81 year old male with history of vomiting since this morning. Currently on antibiotics for cellulitis of the left leg. No current abdominal pain, diarrhea, chest pain or cough. EXAM: PORTABLE CHEST 1 VIEW COMPARISON:  Chest CT 04/09/2008. FINDINGS: Mild elevation of the right hemidiaphragm is unchanged. Lung volumes are normal. No consolidative airspace disease. No pleural effusions. No pneumothorax. No pulmonary nodule or mass noted. Pulmonary vasculature and the cardiomediastinal silhouette are within normal limits. IMPRESSION: 1. No radiographic evidence of acute cardiopulmonary disease. Electronically Signed   By: Vinnie Langton M.D.   On: 04/11/2015 15:42   Dg Femur Min 2 Views Left  04/11/2015  CLINICAL DATA:  Cellulitis and chronic left lower leg ulcer.  Pain all over mid thigh. EXAM: LEFT FEMUR 2 VIEWS COMPARISON:  None. FINDINGS: Posttraumatic deformity of the femoral diaphysis is evident. Overlying soft tissues are unremarkable. Advanced degenerative changes are seen at the knee. IMPRESSION: Posttraumatic deformity mid femur with advanced osteoarthritis of the knee. Electronically Signed   By: Misty Stanley M.D.   On: 04/11/2015 20:30   US Abdomen Limited Ruq  04/11/2015  CLINICAL DATA:  Liver function abnormality. Elevated AST, ALT and alk-phos. Nausea and vomiting. EXAM: US ABDOMEN LIMITED - RIGHT UPPER QUADRANT COMPARISON:  CT 03/02/2010 FINDINGS: Gallbladder: Surgically absent. Common bile duct: Diameter: 6 mm, normal. Liver: No focal lesion identified. Within normal limits in parenchymal echogenicity. Mild central intrahepatic biliary ductal dilatation. IMPRESSION: 1. Postcholecystectomy. 2. Mild intrahepatic biliary ductal dilatation with normal caliber common bile duct, likely sequela of prior cholecystectomy. No sonographic evidence of central obstructing mass. 3. Otherwise normal sonographic appearance of the liver. Electronically Signed   By: Jeb Levering M.D.   On: 04/11/2015 18:01     CBC  Recent Labs Lab 04/11/15 1145 04/12/15 0346 04/13/15 0409 04/15/15 0459 04/16/15 0516  WBC 7.9 10.7* 11.5* 9.3 10.5  HGB 12.1* 11.1* 10.8* 10.5* 9.8*  HCT 35.7* 32.3* 30.9* 30.9* 28.7*  PLT 148* 123* 127* 184 204  MCV 95.4 95.5 94.2 94.8 95.1  MCH 32.3 33.0 32.9 32.3 32.5  MCHC 33.8 34.5 34.9 34.0 34.2  RDW 14.0 13.7 14.0 13.9 14.1    Chemistries   Recent Labs Lab 04/11/15 1145 04/12/15 0346 04/13/15 0409 04/14/15 0430 04/15/15 0459 04/16/15 0516  NA 132* 132* 135 133* 134* 134*  K 3.9 4.1 3.8 4.0 3.9 4.4  CL 100* 98* 101 99* 99* 99*  CO2 _0 GLUCOSE  106* 103* 110* 131* 186* 127*  BUN _0 CREATININE 1.03 0.99 0.88 0.88 0.79 0.84  CALCIUM 8.6* 8.1* 8.3* 8.2* 8.2* 8.2*  AST 316* 247* 106*  --   39  --   ALT 327* 280* 181*  --  93*  --   ALKPHOS 354* 372* 296*  --  231*  --   BILITOT 1.2 2.9* 1.4*  --  0.8  --    ------------------------------------------------------------------------------------------------------------------ estimated creatinine clearance is 74.8 mL/min (by C-G formula based on Cr of 0.84). ------------------------------------------------------------------------------------------------------------------ No results for input(s): HGBA1C in the last 72 hours. ------------------------------------------------------------------------------------------------------------------ No results for input(s): CHOL, HDL, LDLCALC, TRIG, CHOLHDL, LDLDIRECT in the last 72 hours. ------------------------------------------------------------------------------------------------------------------ No results for input(s): TSH, T4TOTAL, T3FREE, THYROIDAB in the last 72 hours.  Invalid input(s): FREET3 ------------------------------------------------------------------------------------------------------------------ No results for input(s): VITAMINB12, FOLATE, FERRITIN, TIBC, IRON, RETICCTPCT in the last 72 hours.  Coagulation profile  Recent Labs Lab 04/11/15 1726  INR 1.15    No results for input(s): DDIMER in the last 72 hours.  Cardiac Enzymes  Recent Labs Lab 04/11/15 1145  TROPONINI <0.03   ------------------------------------------------------------------------------------------------------------------ Invalid input(s): POCBNP    Assessment & Plan  Patient is a 81 year old white male with recurrent lower extremity cellulitis   1. Left leg cellulitis,  Continues to improve. Has myositis on the MRI. We will continue 1 more day of IV antibiotics   2. Petechial rash: CT angiogram of the lower extremity negative.  1 more dose of prednisone today  3.  Elevated liver function tests Hepatitis panel negative no evidence of autoimmune hepatitis will need outpatient   follow-up with GI  4. Restless leg syndrome  continue Requip  5. Hypothyroidism continue Synthroid  6. Muscle pain in his left thigh I will continue Soma seen by orthopedics extra of his lower back will be ordered  7. Urinary retention: Likely due to prostate enlargement noted on his CT patient restarted on Flomax else start him on Hytrin. He'll need outpatient urology evaluation continue in and out catheter   We'll obtain physical therapy valve  * DVT prophylaxis with Lovenox  All the records are reviewed and case discussed with ED provider. Management plans discussed with the patient, family and they are in agreement.      Code Status Orders        Start     Ordered   04/11/15 1702  Full code   Continuous     04/11/15 1702    Code Status History    Date Active Date Inactive Code Status Order ID Comments User Context   04/03/2015 12:40 PM 04/06/2015  3:24 PM Full Code [id number]  Hillary Bow, MD Inpatient           Consults ID  DVT Prophylaxis  Lovenox  Lab Results  Component Value Date   PLT 204 04/16/2015     Time Spent in minutes   43mn  PDustin FlockM.D on 04/16/2015 at 10:28 AM  Between 7am to 6pm - Pager - [phone]  After 6pm go to [email or web address] - password [email or web address]   Office  [phone]

## 2015-04-16 NOTE — Care Management Important Message (Signed)
Important Message  Patient Details  Name: Ronald Huerta MRN: [id number] Date of Birth: 06-26-1934   Medicare Important Message Given:  Yes    Olegario MessierKathy A Keiondra Brookover 04/16/2015, 10:37 AM

## 2015-04-17 LAB — COMPREHENSIVE METABOLIC PANEL
ALT: 90 U/L — AB (ref 17–63)
AST: 63 U/L — ABNORMAL HIGH (ref 15–41)
Albumin: 3 g/dL — ABNORMAL LOW (ref 3.5–5.0)
Alkaline Phosphatase: 236 U/L — ABNORMAL HIGH (ref 38–126)
Anion gap: 7 (ref 5–15)
BUN: 20 mg/dL (ref 6–20)
CHLORIDE: 101 mmol/L (ref 101–111)
CO2: 30 mmol/L (ref 22–32)
CREATININE: 0.91 mg/dL (ref 0.61–1.24)
Calcium: 8.8 mg/dL — ABNORMAL LOW (ref 8.9–10.3)
Glucose, Bld: 125 mg/dL — ABNORMAL HIGH (ref 65–99)
POTASSIUM: 3.7 mmol/L (ref 3.5–5.1)
Sodium: 138 mmol/L (ref 135–145)
Total Bilirubin: 0.8 mg/dL (ref 0.3–1.2)
Total Protein: 6.9 g/dL (ref 6.5–8.1)

## 2015-04-17 MED ORDER — AMOXICILLIN-POT CLAVULANATE 875-125 MG PO TABS
1.0000 | ORAL_TABLET | Freq: Two times a day (BID) | ORAL | Status: DC
  Administered 2015-04-17 – 2015-04-18 (×3): 1 via ORAL
  Filled 2015-04-17 (×3): qty 1

## 2015-04-17 MED ORDER — ALUM & MAG HYDROXIDE-SIMETH 200-200-20 MG/5ML PO SUSP
30.0000 mL | ORAL | Status: DC | PRN
  Administered 2015-04-17: 30 mL via ORAL
  Filled 2015-04-17: qty 30

## 2015-04-17 MED ORDER — DOCUSATE SODIUM 100 MG PO CAPS
200.0000 mg | ORAL_CAPSULE | Freq: Two times a day (BID) | ORAL | Status: DC
  Administered 2015-04-17 (×2): 200 mg via ORAL
  Filled 2015-04-17 (×3): qty 2

## 2015-04-17 NOTE — Progress Notes (Signed)
Pt experiencing midsternal chest tightness. Vss. Dr hower  Notified. md orders maalox

## 2015-04-17 NOTE — Progress Notes (Signed)
[place] at [facility]                                                                                                                                                                                            Patient Demographics   Ronald Huerta, is a 80 y.o. male, DOB - 15-Sep-1934, NZV:[id number]  Admit date - 04/11/2015   Admitting Physician Hillary Bow, MD  Outpatient Primary MD for the patient is Tracie Harrier, MD   LOS - 6  Subjective: Subjective:  Patient complains of pain in the thigh, rash is decreasing Patient's able to him urinate without needing in and out .  Continue Flomax and Hytrin   Review of Systems:   CONSTITUTIONAL: No documented fever. No fatigue, weakness. No weight gain, no weight loss.  EYES: No blurry or double vision.  ENT: No tinnitus. No postnasal drip. No redness of the oropharynx.  RESPIRATORY: No cough, no wheeze, no hemoptysis. No dyspnea.  CARDIOVASCULAR: No chest pain. No orthopnea. No palpitations. No syncope.  GASTROINTESTINAL: No nausea, no vomiting or diarrhea. No abdominal pain. No melena or hematochezia.  GENITOURINARY: No dysuria or hematuria.  ENDOCRINE: No polyuria or nocturia. No heat or cold intolerance.  HEMATOLOGY: No anemia. No bruising. No bleeding.  INTEGUMENTARY: Rash and erythema involving his leg persist but improved , etechial rash involving both legs improving MUSCULOSKELETAL: Positive hip pain, back pain NEUROLOGIC: No numbness, tingling, or ataxia. No seizure-type activity.  PSYCHIATRIC: No anxiety. No insomnia. No ADD.    Vitals:   Filed Vitals:   04/16/15 1538 04/16/15 1942 04/17/15 0509 04/17/15 0742  BP: 130/50 126/50 113/59 131/69  Pulse:  87 67 69  Temp:  98.1 F (36.7 C) 97.9 F (36.6 C) 98 F (36.7 C)  TempSrc:  Oral Oral Oral  Resp:  '18 18 18  ' Height:      Weight:   80.015 kg (176 lb 6.4 oz)   SpO2:  93% 96% 94%    Wt Readings from Last 3 Encounters:   04/17/15 80.015 kg (176 lb 6.4 oz)  04/06/15 72.031 kg (158 lb 12.8 oz)  02/10/15 75.025 kg (165 lb 6.4 oz)     Intake/Output Summary (Last 24 hours) at 04/17/15 1015 Last data filed at 04/17/15 0845  Gross per 24 hour  Intake    600 ml  Output   1500 ml  Net   -900 ml    Physical Exam:   GENERAL: Pleasant-appearing in no apparent distress.  HEAD, EYES, EARS, NOSE AND THROAT: Atraumatic, normocephalic. Extraocular muscles are intact. Pupils equal and reactive to light. Sclerae anicteric. No  conjunctival injection. No oro-pharyngeal erythema.  NECK: Supple. There is no jugular venous distention. No bruits, no lymphadenopathy, no thyromegaly.  HEART: Regular rate and rhythm,. No murmurs, no rubs, no clicks.  LUNGS: Clear to auscultation bilaterally. No rales or rhonchi. No wheezes.  ABDOMEN: Soft, flat, nontender, nondistended. Has good bowel sounds. No hepatosplenomegaly appreciated.  EXTREMITIES: No evidence of any cyanosis, clubbing, or peripheral edema.  +2 pedal and radial pulses bilaterally.  NEUROLOGIC: The patient is alert, awake, and oriented x3 with no focal motor or sensory deficits appreciated bilaterally.  SKIN: Right lower extremity erythema improved , petechial rash appears to be resolved Lower extremities  Psych: Not anxious, depressed LN: No inguinal LN enlargement    Antibiotics   Anti-infectives    Start     Dose/Rate Route Frequency Ordered Stop   04/16/15 1030  vancomycin (VANCOCIN) IVPB 1000 mg/200 mL premix     1,000 mg 200 mL/hr over 60 Minutes Intravenous Every 12 hours 04/15/15 2322     04/13/15 2218  vancomycin (VANCOCIN) 1,250 mg in sodium chloride 0.9 % 250 mL IVPB  Status:  Discontinued     1,250 mg 166.7 mL/hr over 90 Minutes Intravenous Every 12 hours 04/13/15 2219 04/15/15 2322   04/12/15 1200  Ampicillin-Sulbactam (UNASYN) 3 g in sodium chloride 0.9 % 100 mL IVPB     3 g 100 mL/hr over 60 Minutes Intravenous Every 6 hours 04/12/15 1158      04/11/15 2200  vancomycin (VANCOCIN) 1,250 mg in sodium chloride 0.9 % 250 mL IVPB  Status:  Discontinued     1,250 mg 166.7 mL/hr over 90 Minutes Intravenous Every 24 hours 04/11/15 1759 04/13/15 2219   04/11/15 1530  vancomycin (VANCOCIN) IVPB 1000 mg/200 mL premix     1,000 mg 200 mL/hr over 60 Minutes Intravenous  Once 04/11/15 1523 04/11/15 1721      Medications   Scheduled Meds: . ampicillin-sulbactam (UNASYN) IV  3 g Intravenous Q6H  . aspirin EC  81 mg Oral Daily  . carisoprodol  350 mg Oral TID  . enoxaparin (LOVENOX) injection  40 mg Subcutaneous Q24H  . feeding supplement (ENSURE ENLIVE)  237 mL Oral BID BM  . ketorolac  15 mg Intravenous 3 times per day  . levothyroxine  25 mcg Oral Q0600  . multivitamin with minerals  1 tablet Oral Daily  . pantoprazole  40 mg Oral BID  . psyllium  1 packet Oral Daily  . rOPINIRole  0.75 mg Oral TID  . sodium chloride  1,000 mL Intravenous Once  . tamsulosin  0.4 mg Oral Daily  . terazosin  2 mg Oral QHS  . terazosin  5 mg Oral Daily  . vancomycin  1,000 mg Intravenous Q12H   Continuous Infusions:  PRN Meds:.acetaminophen **OR** acetaminophen, bisacodyl, HYDROcodone-acetaminophen, ondansetron (ZOFRAN) IV, polyethylene glycol   Data Review:   Micro Results Recent Results (from the past 240 hour(s))  Blood culture (routine x 2)     Status: None   Collection Time: 04/11/15  3:25 PM  Result Value Ref Range Status   Specimen Description BLOOD LEFT ARM  Final   Special Requests BOTTLES DRAWN AEROBIC AND ANAEROBIC [place]  Final   Culture NO GROWTH 5 DAYS  Final   Report Status 04/16/2015 FINAL  Final  Rapid Influenza A&B Antigens ([place] only)     Status: None   Collection Time: 04/11/15  3:26 PM  Result Value Ref Range Status   Influenza A ([facility]) NEGATIVE NEGATIVE Final  Influenza B ([facility]) NEGATIVE NEGATIVE Final  Blood culture (routine x 2)     Status: None   Collection Time: 04/11/15  3:30 PM  Result Value Ref Range Status    Specimen Description BLOOD RIGHT ARM  Final   Special Requests BOTTLES DRAWN AEROBIC AND ANAEROBIC [place]  Final   Culture NO GROWTH 5 DAYS  Final   Report Status 04/16/2015 FINAL  Final  Urine culture     Status: None   Collection Time: 04/12/15  5:06 PM  Result Value Ref Range Status   Specimen Description URINE, RANDOM  Final   Special Requests Normal  Final   Culture NO GROWTH 2 DAYS  Final   Report Status 04/14/2015 FINAL  Final    Radiology Reports Dg Lumbar Spine Complete  04/16/2015  CLINICAL DATA:  Known history of restless leg syndrome. Chronic left lower extremity ulcer. Lower back pain. EXAM: LUMBAR SPINE - COMPLETE 4+ VIEW COMPARISON:  CT 04/14/2015 FINDINGS: Subtle curvature of the lumbar spine convex right. Mild diffuse decreased bone mineralization. There is mild to moderate spondylosis throughout the lumbar spine. Minimal disc space narrowing at the L4-5 and L5-S1 levels. No evidence of compression fracture or subluxation. IMPRESSION: Mild moderate spondylosis of the lumbar spine with disc disease at the L4-5 and L5-S1 levels. No acute findings. Electronically Signed   By: Marin Olp M.D.   On: 04/16/2015 12:26   Ct Angio Ao+bifem W/cm &/or Wo/cm  04/14/2015  CLINICAL DATA:  Pt. C/o left thigh pain and lower back pain. Pt. Also has worsening petechiae on left leg as well as cellulitis of left foot. Pt. Has a hx of chronic lower extremity cellulitis. No hx of CA. EXAM: CT ANGIOGRAPHY ABDOMEN AND PELVIS CT ANGIOGRAPHY BILATERAL LOWER EXTREMITIES TECHNIQUE: Multidetector CT imaging of the abdomen and pelvis was performed using the standard protocol during bolus administration of intravenous contrast. Axial helical CT of bilateral lower extremities after power injection of intravenous contrast. Coronal and sagittal reconstructions were generated for vascular evaluation. Multiplanar reconstructed images including MIPs were obtained and reviewed to evaluate the vascular anatomy.  CONTRAST:  161m OMNIPAQUE IOHEXOL 350 MG/ML SOLN COMPARISON:  COMPARISON 03/02/2010 FINDINGS: ARTERIAL Aorta: Scattered atheromatous plaque. No aneurysm, dissection, or stenosis. Celiac axis: Mild short-segment stenosis of the level of the median arcuate ligament of the diaphragm, patent distally. Superior mesenteric:  Patent, classic distal branch anatomy. Left renal:           Single, patent Right renal:          Single, patent. Inferior mesenteric: Patent. There is a diminutive accessory visceral branch arising just cephalad to the IMA. Left iliac:           Patent Right iliac:          Patent Left lower extremity: Patent common femoral, deep femoral, SFA, popliteal arteries. Patent 3 vessel tibial runoff, with some limitation of assessment secondary to extensive venous opacification. Right lower extremity: Patent common femoral, deep femoral, SFA, and popliteal arteries. Patent trifurcation runoff, somewhat limited assessment due to extensive venous opacification. VENOUS: Dedicated venous phase imaging of the abdomen was not obtained. Early opacification of bilateral renal veins noted. Extensive calf and saphenous venous opacification below the knee bilaterally. Subcutaneous varicose veins communicating to the GSV in the medial distal left calf. Review of the MIP images confirms the above findings. NONVASCULAR Lower chest: Small pleural effusions. Consolidation or dependent atelectasis posteriorly in the visualized lower lobes, new since previous. No pericardial effusion. Hepatobiliary:  No masses or other significant abnormality. Cholecystectomy clips. Pancreas: No mass, inflammatory changes, or other significant abnormality. Spleen: Within normal limits in size and appearance. Adrenals/Urinary Tract: No masses identified. No evidence of hydronephrosis. Stomach/Bowel: No evidence of obstruction, inflammatory process, or abnormal fluid collections. Lymphatic: No pathologically enlarged lymph nodes. Reproductive:  Marked prostatic enlargement protruding into the lumen of the urinary bladder, with central coarse calcifications. Other: Trace free fluid in the pelvis.  No free air. Musculoskeletal: Mild spondylitic changes in the lower thoracic and lumbar spine. Healed fracture deformity of the left femur. Advanced DJD in the left knee, most most marked in the lateral compartment. IMPRESSION: 1. No significant aortoiliac or femoral-popliteal arterial occlusive disease bilaterally. Tibial trifurcation runoff appears grossly patent, with some limitations secondary to regional venous opacification. 2. Medial distal left calf varicose veins communicating to the GSV. Given the regional symptomatology, consider ultrasound venous reflux evaluation for further characterization. 3. Bilateral pleural effusions and adjacent lower lobe consolidation/atelectasis. Electronically Signed   By: Lucrezia Europe M.D.   On: 04/14/2015 14:00   Mr Ankle Left  Wo Contrast  04/15/2015  CLINICAL DATA:  Chronic ulceration and swelling. EXAM: MRI OF THE LEFT ANKLE WITHOUT CONTRAST TECHNIQUE: Multiplanar, multisequence MR imaging of the ankle was performed. No intravenous contrast was administered. COMPARISON:  None. FINDINGS: TENDONS Peroneal: Mild common peroneus tendon sheath tenosynovitis in the vicinity of the lateral malleolus. Posteromedial: Unremarkable Anterior: Unremarkable Achilles: Unremarkable Plantar Fascia: Unremarkable LIGAMENTS Lateral: Mildly attenuated ATFL. Medial: Indistinctness of the deep tibiotalar component of the deltoid ligament. Suspected partial tearing of the medioplantar oblique portion of the spring ligament. CARTILAGE Ankle Joint: Trace tibiotalar joint effusion. Articular cartilage preserved. 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 Mild dorsal midfoot spurring. Low-level subcutaneous edema medially and laterally in the ankle and tracking along dorsum of the foot. Low-level  edema tracks within along the plantar musculature of the foot. Tiny focus of metal artifact along the skin surface of the plantar fat foot, image 11 series 7. Consider conventional radiographic correlation. This could simply be a tiny neck microscopic metallic particle of no clinical significance and there is no surrounding edema. Oval-shaped 9 by 6 mm focus of mildly increased T2 and reduced T1 signal in the proximal shaft of the third metatarsal, image 36 series 3 and image 10 series 7. IMPRESSION: 1. Subcutaneous edema both medially and laterally along the ankle, and along the dorsum of the foot. Cellulitis not excluded. No findings of osteomyelitis in the hindfoot or visualized midfoot. 2. Small focus of nonspecific increased T2 and mildly reduced T1 signal in the proximal shaft of the third metatarsal, not completely visualized, and technically nonspecific. 3. Edema tracks along the plantar musculature of the foot potentially from myositis or fasciitis. No drainable abscess identified. 4. Attenuated anterior talofibular ligament, possibly from remote injury. 5. Indistinct deep tibiotalar portion of the deltoid ligament, nonspecific but possibly from prior injury. 6. Partially torn medioplantar oblique portion of the spring ligament. 7. Mild common peroneus tendon sheath tenosynovitis. 8. 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 Electronically Signed   By: Van Clines M.D.   On: 04/15/2015 14:07   US Venous Img Lower Unilateral Left  03/24/2015  CLINICAL DATA:  Left leg swelling for 3 weeks EXAM: Left LOWER EXTREMITY VENOUS DOPPLER ULTRASOUND TECHNIQUE: Gray-scale sonography with graded compression, as well as color Doppler and duplex ultrasound were performed to evaluate the lower extremity deep venous systems from the level of the common femoral  vein and including the common femoral, femoral, profunda femoral, popliteal and calf veins including the posterior tibial, peroneal and gastrocnemius veins  when visible. The superficial great saphenous vein was also interrogated. Spectral Doppler was utilized to evaluate flow at rest and with distal augmentation maneuvers in the common femoral, femoral and popliteal veins. COMPARISON:  None. FINDINGS: Contralateral Common Femoral Vein: Respiratory phasicity is normal and symmetric with the symptomatic side. No evidence of thrombus. Normal compressibility. Common Femoral Vein: No evidence of thrombus. Normal compressibility, respiratory phasicity and response to augmentation. Saphenofemoral Junction: No evidence of thrombus. Normal compressibility and flow on color Doppler imaging. Profunda Femoral Vein: No evidence of thrombus. Normal compressibility and flow on color Doppler imaging. Femoral Vein: No evidence of thrombus. Normal compressibility, respiratory phasicity and response to augmentation. Popliteal Vein: No evidence of thrombus. Normal compressibility, respiratory phasicity and response to augmentation. Calf Veins: No evidence of thrombus. Normal compressibility and flow on color Doppler imaging. Superficial Great Saphenous Vein: No evidence of thrombus. Normal compressibility and flow on color Doppler imaging. Venous Reflux:  None. Other Findings: There is enlarged lymph node in left inguinal region measures 3.2 x 2.4 cm probable reactive. The technologist reports left left ankle swelling and redness and skin wound. Clinical correlation is necessary. IMPRESSION: No evidence of deep venous thrombosis left lower extremity.There is enlarged lymph node in left inguinal region measures 3.2 x 2.4 cm probable reactive. The technologist reports left left ankle swelling and redness and skin wound. Clinical correlation is necessary. Electronically Signed   By: Lahoma Crocker M.D.   On: 03/24/2015 15:16   Dg Chest Portable 1 View  04/11/2015  CLINICAL DATA:  81 year old male with history of vomiting since this morning. Currently on antibiotics for cellulitis of the left  leg. No current abdominal pain, diarrhea, chest pain or cough. EXAM: PORTABLE CHEST 1 VIEW COMPARISON:  Chest CT 04/09/2008. FINDINGS: Mild elevation of the right hemidiaphragm is unchanged. Lung volumes are normal. No consolidative airspace disease. No pleural effusions. No pneumothorax. No pulmonary nodule or mass noted. Pulmonary vasculature and the cardiomediastinal silhouette are within normal limits. IMPRESSION: 1. No radiographic evidence of acute cardiopulmonary disease. Electronically Signed   By: Vinnie Langton M.D.   On: 04/11/2015 15:42   Dg Femur Min 2 Views Left  04/11/2015  CLINICAL DATA:  Cellulitis and chronic left lower leg ulcer. Pain all over mid thigh. EXAM: LEFT FEMUR 2 VIEWS COMPARISON:  None. FINDINGS: Posttraumatic deformity of the femoral diaphysis is evident. Overlying soft tissues are unremarkable. Advanced degenerative changes are seen at the knee. IMPRESSION: Posttraumatic deformity mid femur with advanced osteoarthritis of the knee. Electronically Signed   By: Misty Stanley M.D.   On: 04/11/2015 20:30   US Abdomen Limited Ruq  04/11/2015  CLINICAL DATA:  Liver function abnormality. Elevated AST, ALT and alk-phos. Nausea and vomiting. EXAM: US ABDOMEN LIMITED - RIGHT UPPER QUADRANT COMPARISON:  CT 03/02/2010 FINDINGS: Gallbladder: Surgically absent. Common bile duct: Diameter: 6 mm, normal. Liver: No focal lesion identified. Within normal limits in parenchymal echogenicity. Mild central intrahepatic biliary ductal dilatation. IMPRESSION: 1. Postcholecystectomy. 2. Mild intrahepatic biliary ductal dilatation with normal caliber common bile duct, likely sequela of prior cholecystectomy. No sonographic evidence of central obstructing mass. 3. Otherwise normal sonographic appearance of the liver. Electronically Signed   By: Jeb Levering M.D.   On: 04/11/2015 18:01     CBC  Recent Labs Lab 04/11/15 1145 04/12/15 0346 04/13/15 0409 04/15/15 0459 04/16/15 0516  WBC 7.9  10.7* 11.5* 9.3 10.5  HGB 12.1* 11.1* 10.8* 10.5* 9.8*  HCT 35.7* 32.3* 30.9* 30.9* 28.7*  PLT 148* 123* 127* 184 204  MCV 95.4 95.5 94.2 94.8 95.1  MCH 32.3 33.0 32.9 32.3 32.5  MCHC 33.8 34.5 34.9 34.0 34.2  RDW 14.0 13.7 14.0 13.9 14.1    Chemistries   Recent Labs Lab 04/11/15 1145 04/12/15 0346 04/13/15 0409 04/14/15 0430 04/15/15 0459 04/16/15 0516 04/17/15 0510  NA 132* 132* 135 133* 134* 134* 138  K 3.9 4.1 3.8 4.0 3.9 4.4 3.7  CL 100* 98* 101 99* 99* 99* 101  CO2 '27 26 30 29 29 30 30  ' GLUCOSE 106* 103* 110* 131* 186* 127* 125*  BUN '12 14 14 15 17 17 20  ' CREATININE 1.03 0.99 0.88 0.88 0.79 0.84 0.91  CALCIUM 8.6* 8.1* 8.3* 8.2* 8.2* 8.2* 8.8*  AST 316* 247* 106*  --  39  --  63*  ALT 327* 280* 181*  --  93*  --  90*  ALKPHOS 354* 372* 296*  --  231*  --  236*  BILITOT 1.2 2.9* 1.4*  --  0.8  --  0.8   ------------------------------------------------------------------------------------------------------------------ estimated creatinine clearance is 69.1 mL/min (by C-G formula based on Cr of 0.91). ------------------------------------------------------------------------------------------------------------------ No results for input(s): HGBA1C in the last 72 hours. ------------------------------------------------------------------------------------------------------------------ No results for input(s): CHOL, HDL, LDLCALC, TRIG, CHOLHDL, LDLDIRECT in the last 72 hours. ------------------------------------------------------------------------------------------------------------------ No results for input(s): TSH, T4TOTAL, T3FREE, THYROIDAB in the last 72 hours.  Invalid input(s): FREET3 ------------------------------------------------------------------------------------------------------------------ No results for input(s): VITAMINB12, FOLATE, FERRITIN, TIBC, IRON, RETICCTPCT in the last 72 hours.  Coagulation profile  Recent Labs Lab 04/11/15 1726  INR 1.15     No results for input(s): DDIMER in the last 72 hours.  Cardiac Enzymes  Recent Labs Lab 04/11/15 1145  TROPONINI <0.03   ------------------------------------------------------------------------------------------------------------------ Invalid input(s): POCBNP    Assessment & Plan  Patient is a 81 year old white male with recurrent lower extremity cellulitis   1. Left leg cellulitis,  Continues to improve. We'll change to oral Augmentin C patient's response   2. Petechial rash: CT angiogram of the lower extremity negative.  Continues to respond to steroid I will give him 1 more dose today   3.  Elevated liver function tests Hepatitis panel negative no evidence of autoimmune hepatitis will need outpatient  follow-up with GI  4. Restless leg syndrome  continue Requip  5. Hypothyroidism continue Synthroid  6. Muscle pain in his left thigh continue S[place] seen by orthopedics extra of his lower back will be ordered  7. Urinary retention:  Due to BPH - flomax, hydtrin  We'll obtain physical therapy valve  * DVT prophylaxis with Lovenox  All the records are reviewed and case discussed with ED provider. Management plans discussed with the patient, family and they are in agreement.      Code Status Orders        Start     Ordered   04/11/15 1702  Full code   Continuous     04/11/15 1702    Code Status History    Date Active Date Inactive Code Status Order ID Comments User Context   04/03/2015 12:40 PM 04/06/2015  3:24 PM Full Code [id number]  Hillary Bow, MD Inpatient           Consults ID  DVT Prophylaxis  Lovenox  Lab Results  Component Value Date   PLT 204 04/16/2015     Time Spent in minutes  50mn  Yaileen Hofferber M.D on 04/17/2015 at 10:15 AM  Between 7am to 6pm - Pager - [phone]  After 6pm go to [email or web address] - password [email or web address]   Office  [phone]

## 2015-04-17 NOTE — Progress Notes (Signed)
[facility] CLINIC INFECTIOUS DISEASE PROGRESS NOTE Date of Admission:  04/11/2015     ID: Ronald Huerta is a 81 y.o. male with LE cellulitis Active Problems:   Cellulitis   Subjective: Seen by ortho - xray l spine done.  Last fever 3/13.  Leg rash improving  ROS  Unable to obtain  Medications:  Antibiotics Given (last 72 hours)    Date/Time Action Medication Dose Rate   04/14/15 1752 Given   Ampicillin-Sulbactam (UNASYN) 3 g in sodium chloride 0.9 % 100 mL IVPB 3 g 100 mL/hr   04/14/15 2153 Given   vancomycin (VANCOCIN) 1,250 mg in sodium chloride 0.9 % 250 mL IVPB 1,250 mg 166.7 mL/hr   04/15/15 0058 Given   Ampicillin-Sulbactam (UNASYN) 3 g in sodium chloride 0.9 % 100 mL IVPB 3 g 100 mL/hr   04/15/15 0531 Given   Ampicillin-Sulbactam (UNASYN) 3 g in sodium chloride 0.9 % 100 mL IVPB 3 g 100 mL/hr   04/15/15 0921 Given   vancomycin (VANCOCIN) 1,250 mg in sodium chloride 0.9 % 250 mL IVPB 1,250 mg 166.7 mL/hr   04/15/15 1423 Given  [moved patient off unit for test]   Ampicillin-Sulbactam (UNASYN) 3 g in sodium chloride 0.9 % 100 mL IVPB 3 g 100 mL/hr   04/15/15 2135 Given   Ampicillin-Sulbactam (UNASYN) 3 g in sodium chloride 0.9 % 100 mL IVPB 3 g 100 mL/hr   04/15/15 2235 Given   vancomycin (VANCOCIN) 1,250 mg in sodium chloride 0.9 % 250 mL IVPB 1,250 mg 166.7 mL/hr   04/16/15 0013 Given   Ampicillin-Sulbactam (UNASYN) 3 g in sodium chloride 0.9 % 100 mL IVPB 3 g 100 mL/hr   04/16/15 0618 Given   Ampicillin-Sulbactam (UNASYN) 3 g in sodium chloride 0.9 % 100 mL IVPB 3 g 100 mL/hr   04/16/15 0945 Given   vancomycin (VANCOCIN) IVPB 1000 mg/200 mL premix 1,000 mg 200 mL/hr   04/16/15 1302 Given   Ampicillin-Sulbactam (UNASYN) 3 g in sodium chloride 0.9 % 100 mL IVPB 3 g 100 mL/hr   04/16/15 1800 Given   Ampicillin-Sulbactam (UNASYN) 3 g in sodium chloride 0.9 % 100 mL IVPB 3 g 100 mL/hr   04/16/15 2145 Given   vancomycin (VANCOCIN) IVPB 1000 mg/200 mL premix 1,000 mg  200 mL/hr   04/16/15 2243 Given   Ampicillin-Sulbactam (UNASYN) 3 g in sodium chloride 0.9 % 100 mL IVPB 3 g 100 mL/hr   04/17/15 0529 Given   Ampicillin-Sulbactam (UNASYN) 3 g in sodium chloride 0.9 % 100 mL IVPB 3 g 100 mL/hr   04/17/15 0926 Given   vancomycin (VANCOCIN) IVPB 1000 mg/200 mL premix 1,000 mg 200 mL/hr   04/17/15 1138 Given   amoxicillin-clavulanate (AUGMENTIN) 875-125 MG per tablet 1 tablet 1 tablet      . amoxicillin-clavulanate  1 tablet Oral Q12H  . aspirin EC  81 mg Oral Daily  . carisoprodol  350 mg Oral TID  . docusate sodium  200 mg Oral BID  . enoxaparin (LOVENOX) injection  40 mg Subcutaneous Q24H  . feeding supplement (ENSURE ENLIVE)  237 mL Oral BID BM  . ketorolac  15 mg Intravenous 3 times per day  . levothyroxine  25 mcg Oral Q0600  . multivitamin with minerals  1 tablet Oral Daily  . pantoprazole  40 mg Oral BID  . psyllium  1 packet Oral Daily  . rOPINIRole  0.75 mg Oral TID  . sodium chloride  1,000 mL Intravenous Once  . tamsulosin  0.4 mg Oral Daily  . terazosin  2 mg Oral QHS  . terazosin  5 mg Oral Daily  . vancomycin  1,000 mg Intravenous Q12H    Objective: Vital signs in last 24 hours: Temp:  [97.8 F (36.6 C)-98.4 F (36.9 C)] 98.4 F (36.9 C) (03/17 1434) Pulse Rate:  [67-88] 85 (03/17 1434) Resp:  [18-20] 18 (03/17 1434) BP: (95-131)/(50-69) 115/50 mmHg (03/17 1434) SpO2:  [93 %-96 %] 93 % (03/17 1434) Weight:  [80.015 kg (176 lb 6.4 oz)] 80.015 kg (176 lb 6.4 oz) (03/17 0509) Constitutional: He is oriented to person, place, and time. Deaf, NAD HENT: perrla Mouth/Throat: Oropharynx is clear and moist. No oropharyngeal exudate.  Cardiovascular: Normal rate, regular rhythm and normal heart sounds.  Pulmonary/Chest: Effort normal and breath sounds normal. No respiratory distress. He has no wheezes.  Abdominal: Soft. Bowel sounds are normal. He exhibits no distension. There is no tenderness.  Lymphadenopathy: He has no cervical  adenopathy.  Neurological: He is alert and oriented to person, place, and time.  Skin: LLE with area of scaly erythema and mild warmth, no purulence, 1+ pitting edema. Free motion of ankle. No real open wound Psychiatric: He has a normal mood and affect. His behavior is normal. Lab Results  Recent Labs  04/15/15 0459 04/16/15 0516 04/17/15 0510  WBC 9.3 10.5  --   HGB 10.5* 9.8*  --   HCT 30.9* 28.7*  --   NA 134* 134* 138  K 3.9 4.4 3.7  CL 99* 99* 101  CO2 29 30 30   BUN 17 17 20   CREATININE 0.79 0.84 0.91    Microbiology: Results for orders placed or performed during the hospital encounter of 04/11/15  Blood culture (routine x 2)     Status: None   Collection Time: 04/11/15  3:25 PM  Result Value Ref Range Status   Specimen Description BLOOD LEFT ARM  Final   Special Requests BOTTLES DRAWN AEROBIC AND ANAEROBIC 7CC  Final   Culture NO GROWTH 5 DAYS  Final   Report Status 04/16/2015 FINAL  Final  Rapid Influenza A&B Antigens ([facility] only)     Status: None   Collection Time: 04/11/15  3:26 PM  Result Value Ref Range Status   Influenza A ([facility]) NEGATIVE NEGATIVE Final   Influenza B ([facility]) NEGATIVE NEGATIVE Final  Blood culture (routine x 2)     Status: None   Collection Time: 04/11/15  3:30 PM  Result Value Ref Range Status   Specimen Description BLOOD RIGHT ARM  Final   Special Requests BOTTLES DRAWN AEROBIC AND ANAEROBIC 7CC  Final   Culture NO GROWTH 5 DAYS  Final   Report Status 04/16/2015 FINAL  Final  Urine culture     Status: None   Collection Time: 04/12/15  5:06 PM  Result Value Ref Range Status   Specimen Description URINE, RANDOM  Final   Special Requests Normal  Final   Culture NO GROWTH 2 DAYS  Final   Report Status 04/14/2015 FINAL  Final    Studies/Results: Dg Lumbar Spine Complete  04/16/2015  CLINICAL DATA:  Known history of restless leg syndrome. Chronic left lower extremity ulcer. Lower back pain. EXAM: LUMBAR SPINE - COMPLETE 4+ VIEW COMPARISON:   CT 04/14/2015 FINDINGS: Subtle curvature of the lumbar spine convex right. Mild diffuse decreased bone mineralization. There is mild to moderate spondylosis throughout the lumbar spine. Minimal disc space narrowing at the L4-5 and L5-S1 levels. No evidence of compression fracture or  subluxation. IMPRESSION: Mild moderate spondylosis of the lumbar spine with disc disease at the L4-5 and L5-S1 levels. No acute findings. Electronically Signed   By: Elberta Fortis M.D.   On: 04/16/2015 12:26    Assessment/Plan: Ronald Huerta is a 81 y.o. male with RLS, deafness admitted with worsening LLE cellulitis despite otpt abx therapy. Was admitted previously 3/3-3/6 with same and treated with IV vanco then transitioned to oral bactrim.  He was seen in otpt setting twice last week and noted to have increased cr and lfts. Cx wound showed no growth, changed to keflex as he also developed a rash possibly to the bactrim. Admitted now with fevers and worsening redness. Also noted to have elevated LFTs but neg USS abd.  Had severe back and R side pain but now resolved with tramadol and soma. Had bad L thigh spasm. He has been on vanco and unasyn since admit. Had initial wbc of 7.9 then increased to 11.5. Low grade fevers to 100.6 but resolving since 3/13. UA did show tntc wbc but ucx done next day is negative.  His leg seems stable and I wonder if his fever is related to his liver process and possible drug reaction. Bactrim can cause a hepatitis and a rash.  LFTS are improving   Rec Agree with change to oral abx and follow. Can dc vancomyci  CT also showed marked prostate enlargement - will need otpt fu Thank you very much for the consult. Will follow with you.  Berma Harts   04/17/2015, 2:37 PM

## 2015-04-17 NOTE — Progress Notes (Signed)
Pharmacy Antibiotic Note  Ronald Huerta is a 81 y.o. male admitted on 04/11/2015 with cellulitis.  Pharmacy  consulted for vancomycin and unsayn dosing. Patient has a chronic LLE ulcer and was recently treated with bactrim and cephalexin outpatient. He was recently admitted to hospital and responded to vancomycin. ID would like to continue Vancomycin and Unasyn for now but can dc on Keflex and Doxy for 10 days.    Plan:  Continue Vancomycin 1 g IV q12 hours.   Unasyn: Continue Unasyn 3 g IV q6 hours.   Height: 5\' 10"  (177.8 cm) Weight: 176 lb 6.4 oz (80.015 kg) IBW/kg (Calculated) : 73. Used actual body weight of 71.7 kg  Temp (24hrs), Avg:98 F (36.7 C), Min:97.8 F (36.6 C), Max:98.1 F (36.7 C)   Recent Labs Lab 04/11/15 1145 04/11/15 1526 04/12/15 0346 04/13/15 0409 04/13/15 2131 04/14/15 0430 04/15/15 0459 04/15/15 2114 04/16/15 0516 04/17/15 0510  WBC 7.9  --  10.7* 11.5*  --   --  9.3  --  10.5  --   CREATININE 1.03  --  0.99 0.88  --  0.88 0.79  --  0.84 0.91  LATICACIDVEN  --  0.9  --   --   --   --   --   --   --   --   VANCOTROUGH  --   --   --   --  5*  --   --  17  --   --     Estimated Creatinine Clearance: 69.1 mL/min (by C-G formula based on Cr of 0.91).    No Known Allergies  Antimicrobials this admission: vancomycin 3/11 >>  Amp/sulbactam 3/11 >  Dose adjustments this admission: Vancomycin 1250 mg IV q12 hours 3/13.  Vancomycin 1000 mg IV q 12 hours 3/15  Microbiology results: 3/11 BCx: NGTD  Thank you for allowing pharmacy to be a part of this patient's care.  Demetrius CharityJames,Candie Gintz D, PharmD Clinical Pharmacist 04/17/2015 9:48 AM

## 2015-04-17 NOTE — Progress Notes (Signed)
Pt reports no bm since last week. Dulcolax 5mg  po given

## 2015-04-17 NOTE — Care Management (Signed)
Missed patient's daughter to further discuss discharge planning. Asked that someone call RNCM with home health selection. Rolling walker delivered. Requested Rx from MD- obtained.

## 2015-04-18 LAB — CREATININE, SERUM
CREATININE: 0.85 mg/dL (ref 0.61–1.24)
GFR calc Af Amer: >60 mL/min (ref >60)

## 2015-04-18 MED ORDER — FINASTERIDE 5 MG PO TABS: 5.0000 mg | ORAL_TABLET | Freq: Every day | ORAL | Status: DC

## 2015-04-18 MED ORDER — AMOXICILLIN-POT CLAVULANATE 875-125 MG PO TABS: 1.0000 | ORAL_TABLET | Freq: Two times a day (BID) | ORAL | Status: DC

## 2015-04-18 MED ORDER — DOXYCYCLINE HYCLATE 100 MG PO TABS: 100.0000 mg | ORAL_TABLET | Freq: Two times a day (BID) | ORAL | Status: DC

## 2015-04-18 MED ORDER — ROPINIROLE HCL 0.25 MG PO TABS: ORAL_TABLET | ORAL | Status: DC

## 2015-04-18 MED ORDER — ENSURE ENLIVE PO LIQD: 237.0000 mL | Freq: Two times a day (BID) | ORAL | Status: DC

## 2015-04-18 MED ORDER — DOXYCYCLINE HYCLATE 100 MG PO TABS
100.0000 mg | ORAL_TABLET | Freq: Two times a day (BID) | ORAL | Status: DC
  Administered 2015-04-18: 100 mg via ORAL
  Filled 2015-04-18: qty 1

## 2015-04-18 MED ORDER — HYDROCODONE-ACETAMINOPHEN 5-325 MG PO TABS: 1.0000 | ORAL_TABLET | Freq: Four times a day (QID) | ORAL | Status: DC | PRN

## 2015-04-18 NOTE — Discharge Summary (Signed)
[facility] [place] [facility]   PATIENT NAME: Ronald Huerta    MR#:  [id number]  DATE OF BIRTH:  [birth year]  DATE OF ADMISSION:  04/11/2015 ADMITTING PHYSICIAN: Milagros Loll, MD  DATE OF DISCHARGE: 04/18/2015  3:30 PM  PRIMARY CARE PHYSICIAN: Barbette Reichmann, MD    ADMISSION DIAGNOSIS:  Liver function abnormality [K76.89] Nausea and vomiting [R11.2]  DISCHARGE DIAGNOSIS:  Active Problems:   Cellulitis   SECONDARY DIAGNOSIS:   Past Medical History  Diagnosis Date  . Anxiety   . Restless leg syndrome   . Cellulitis of calf right  . Calculus, kidney   . HLD (hyperlipidemia)   . IBS (irritable bowel syndrome)   . BPH with obstruction/lower urinary tract symptoms   . Elevated PSA   . Deaf   . Asthma     HOSPITAL COURSE:   1. Left leg cellulitis. Patient was on IV vancomycin and Unasyn during the hospital course. Switched over to Augmentin and doxycycline upon discharge home.  2. Petechial rash of the lower extremities. The likely secondary to Bactrim. 3. Elevated liver function tests- outpatient follow-up needed 4. Restless leg syndrome continue increased dose of Requip 5. Hypothyroidism unspecified continue Synthroid 6. Urinary retention. I wrote a prescription for intermittent catheterizations at home. Hytrin and Proscar prescribed 7. Back pain- improved   DISCHARGE CONDITIONS:   Satisfactory  CONSULTS OBTAINED:  Treatment Team:  Clydie Braun, MD Donato Heinz, MD  DRUG ALLERGIES:  No Known Allergies  DISCHARGE MEDICATIONS:   Discharge Medication List as of 04/18/2015 10:56 AM    START taking these medications   Details  amoxicillin-clavulanate (AUGMENTIN) 875-125 MG tablet Take 1 tablet by mouth every 12 (twelve) hours., Starting 04/18/2015, Until Discontinued, Print    doxycycline (VIBRA-TABS) 100 MG tablet Take 1 tablet (100 mg total) by mouth every 12 (twelve) hours., Starting 04/18/2015, Until Discontinued,  Print    feeding supplement, ENSURE ENLIVE, (ENSURE ENLIVE) LIQD Take 237 mLs by mouth 2 (two) times daily between meals., Starting 04/18/2015, Until Discontinued, Print    finasteride (PROSCAR) 5 MG tablet Take 1 tablet (5 mg total) by mouth at bedtime., Starting 04/18/2015, Until Discontinued, Print    HYDROcodone-acetaminophen (NORCO/VICODIN) 5-325 MG tablet Take 1 tablet by mouth every 6 (six) hours as needed for moderate pain., Starting 04/18/2015, Until Discontinued, Print      CONTINUE these medications which have CHANGED   Details  rOPINIRole (REQUIP) 0.25 MG tablet Three tablets po three times a day, Print      CONTINUE these medications which have NOT CHANGED   Details  aspirin EC 81 MG tablet Take 81 mg by mouth daily. , Until Discontinued, Historical Med    levothyroxine (SYNTHROID, LEVOTHROID) 25 MCG tablet Take 25 mcg by mouth daily before breakfast. , Starting 10/07/2014, Until Wed 10/07/15, Historical Med    Multiple Vitamin (MULTIVITAMIN WITH MINERALS) TABS tablet Take 1 tablet by mouth daily., Until Discontinued, Historical Med    omeprazole (PRILOSEC) 20 MG capsule Take 20 mg by mouth daily., Until Discontinued, Historical Med    psyllium (METAMUCIL) 0.52 g capsule Take 0.52 g by mouth daily., Until Discontinued, Historical Med    terazosin (HYTRIN) 5 MG capsule Take 5 mg by mouth daily., Until Discontinued, Historical Med      STOP taking these medications     acetaminophen (TYLENOL) 325 MG tablet      cephALEXin (KEFLEX) 500 MG capsule      traMADol (ULTRAM) 50 MG tablet  DISCHARGE INSTRUCTIONS:    follow-up PMD one week  If you experience worsening of your admission symptoms, develop shortness of breath, life threatening emergency, suicidal or homicidal thoughts you must seek medical attention immediately by calling 911 or calling your MD immediately  if symptoms less severe.  You Must read complete instructions/literature along with all the  possible adverse reactions/side effects for all the Medicines you take and that have been prescribed to you. Take any new Medicines after you have completely understood and accept all the possible adverse reactions/side effects.   Please note  You were cared for by a hospitalist during your hospital stay. If you have any questions about your discharge medications or the care you received while you were in the hospital after you are discharged, you can call the unit and asked to speak with the hospitalist on call if the hospitalist that took care of you is not available. Once you are discharged, your primary care physician will handle any further medical issues. Please note that NO REFILLS for any discharge medications will be authorized once you are discharged, as it is imperative that you return to your primary care physician (or establish a relationship with a primary care physician if you do not have one) for your aftercare needs so that they can reassess your need for medications and monitor your lab values.    Today   CHIEF COMPLAINT:   Chief Complaint  Patient presents with  . Fever  . Emesis    HISTORY OF PRESENT ILLNESS:  Ronald Huerta  is a 81 y.o. male  presented with fever vomiting and lower extremity cellulitis   VITAL SIGNS:  Blood pressure 110/47, pulse 88, temperature 98 F (36.7 C), temperature source Axillary, resp. rate 18, height  (1.778 m), weight 80.1 kg (176 lb 9.4 oz), SpO2 92 %.  I/O:    PHYSICAL EXAMINATION:  GENERAL:  81 y.o.-year-old patient lying in the bed with no acute distress.  EYES: Pupils equal, round, reactive to light and accommodation. No scleral icterus. Extraocular muscles intact.  HEENT: Head atraumatic, normocephalic. Oropharynx and nasopharynx clear.  NECK:  Supple, no jugular venous distention. No thyroid enlargement, no tenderness.  LUNGS: Normal breath sounds bilaterally, no wheezing, rales,rhonchi or crepitation. No use of  accessory muscles of respiration.  CARDIOVASCULAR: S1, S2 normal. No murmurs, rubs, or gallops.  ABDOMEN: Soft, non-tender, non-distended. Bowel sounds present. No organomegaly or mass.  EXTREMITIES: No pedal edema, cyanosis, or clubbing.  NEUROLOGIC: Muscle strength 5/5 in all extremities. Sensation intact. Gait not checked.  PSYCHIATRIC: The patient is alert and oriented x 3.  SKIN: left leg medially around the ankle slight erythema but fading. Slight petechial rash bilateral lower extremities fading.   DATA REVIEW:   CBC  Recent Labs Lab 04/16/15 0516  WBC 10.5  HGB 9.8*  HCT 28.7*  PLT 204    Chemistries   Recent Labs Lab 04/17/15 0510 04/18/15 0431  NA 138  --   K 3.7  --   CL 101  --   CO2 30  --   GLUCOSE 125*  --   BUN 20  --   CREATININE 0.91 0.85  CALCIUM 8.8*  --   AST 63*  --   ALT 90*  --   ALKPHOS 236*  --   BILITOT 0.8  --      Microbiology Results  Results for orders placed or performed during the hospital encounter of 04/11/15  Blood culture (routine x 2)  Status: None   Collection Time: 04/11/15  3:25 PM  Result Value Ref Range Status   Specimen Description BLOOD LEFT ARM  Final   Special Requests BOTTLES DRAWN AEROBIC AND ANAEROBIC 7CC  Final   Culture NO GROWTH 5 DAYS  Final   Report Status 04/16/2015 FINAL  Final  Rapid Influenza A&B Antigens ([facility] only)     Status: None   Collection Time: 04/11/15  3:26 PM  Result Value Ref Range Status   Influenza A ([facility]) NEGATIVE NEGATIVE Final   Influenza B ([facility]) NEGATIVE NEGATIVE Final  Blood culture (routine x 2)     Status: None   Collection Time: 04/11/15  3:30 PM  Result Value Ref Range Status   Specimen Description BLOOD RIGHT ARM  Final   Special Requests BOTTLES DRAWN AEROBIC AND ANAEROBIC 7CC  Final   Culture NO GROWTH 5 DAYS  Final   Report Status 04/16/2015 FINAL  Final  Urine culture     Status: None   Collection Time: 04/12/15  5:06 PM  Result Value Ref Range Status    Specimen Description URINE, RANDOM  Final   Special Requests Normal  Final   Culture NO GROWTH 2 DAYS  Final   Report Status 04/14/2015 FINAL  Final    Management plans discussed with the patient, family and they are in agreement.  CODE STATUS:     Code Status Orders        Start     Ordered   04/11/15 1702  Full code   Continuous     04/11/15 1702    Code Status History    Date Active Date Inactive Code Status Order ID Comments User Context   04/03/2015 12:40 PM 04/06/2015  3:24 PM Full Code [id number]  Milagros LollSrikar Sudini, MD Inpatient      TOTAL TIME TAKING CARE OF THIS PATIENT: 35  minutes.    Alford HighlandWIETING, Enijah Furr M.D on 04/18/2015 at 5:00 PM  Between 7am to 6pm - Pager - [phone]  After 6pm go to [email or web address] - password [email or web address]  [facility]  Office  [phone]  CC: Primary care physician; Barbette ReichmannHANDE,VISHWANATH, MD

## 2015-04-18 NOTE — Progress Notes (Signed)
Dr.Wieting informed of pt's temp 98.7 orally 1444. Daughter upset with nurse after given ice cream- states " the ice cream did it" referring to temp. Ice Cream was given at 1415. Okay to discharge per Dr.Wieting. Patient discharge via wheelchair.

## 2015-04-18 NOTE — Progress Notes (Signed)
Order to discharge patient to home today with home health, sign language interpreter at the bedside to assist with discharge, discharge instructions given per MD order, home and new medications reviewed, Rx.slip's given, patient and daughter verbalized understanding of discharge instructions given.

## 2015-04-18 NOTE — Progress Notes (Signed)
Sign language interpreter at the bedside along with Dr.Wieting and daughter.

## 2015-04-18 NOTE — Progress Notes (Addendum)
Patient has temp of 100.1, paged Dr.Wieting, waiting for callback.  Spoke with Dr.Wieting - order received to recheck temp in 30-40 min.  Daughter informed of MD order.

## 2015-04-18 NOTE — Care Management Note (Addendum)
Case Management Note  Patient Details  Name: Altamese CabalFranklin Delano Inscore MRN: [id number] Date of Birth: 06-27-1934  Subjective/Objective:      Went to Mr Kniss's room with a sign language interpreter to ask Mr Carola FrostHandy if he or his family has chosen a home health agency to be his home health provider. A family member who was seated beside him motioned for me to stand directly in front of both of them which I did. I then stated to Mr Carola FrostHandy (the patient) and to the interpreter that " I am speaking to Mr Carola FrostHandy" whom I was looking at so the interpreter would interpret my comments to Mr Carola FrostHandy. The family member abruptly stood up and walked out of the room at which time the interpreter informed me that the family member was also deaf. No one informed me that the family member was deaf and I am not sure if there was a problem. I looked for the family member when I left Mr Lampi's room but could not locate her. I informed Mr Detar Northandy's nurse of what had happened. Mr Duplechain's nurse came to me later and stated that the family chose [facility] to be Mr Behunin's home health provider because they had used [facility] in the past. A referral was faxed to [facility] requesting home health PT.               Action/Plan:   Expected Discharge Date:                  Expected Discharge Plan:     In-House Referral:     Discharge planning Services  CM Consult  Post Acute Care Choice:  Durable Medical Equipment, Home Health Choice offered to:  Patient  DME Arranged:  Walker rolling DME Agency:  [facility]  [facility]     Status of Service:  In process, will continue to follow  Medicare Important Message Given:  Yes Date Medicare IM Given:    Medicare IM give by:    Date Additional Medicare IM Given:    Additional Medicare Important Message give by:     If discussed at Long Length of Stay Meetings, dates discussed:    Additional Comments:  Sebrina Kessner  A, RN 04/18/2015, 11:11 AM

## 2015-04-19 LAB — EOSINOPHIL, URINE: EOSINOPHIL, URINE: NONE SEEN %

## 2015-12-22 ENCOUNTER — Other Ambulatory Visit: Payer: Self-pay | Admitting: Internal Medicine

## 2015-12-22 DIAGNOSIS — D649 Anemia, unspecified: Secondary | ICD-10-CM

## 2015-12-22 DIAGNOSIS — R7989 Other specified abnormal findings of blood chemistry: Secondary | ICD-10-CM

## 2015-12-22 DIAGNOSIS — R945 Abnormal results of liver function studies: Secondary | ICD-10-CM

## 2016-01-06 ENCOUNTER — Ambulatory Visit: Admission: RE | Admit: 2016-01-06 | Payer: Medicare Other | Source: Ambulatory Visit

## 2016-01-06 ENCOUNTER — Ambulatory Visit
Admission: RE | Admit: 2016-01-06 | Discharge: 2016-01-06 | Disposition: A | Discharge status: Home / Self Care | Payer: Medicare Other | Source: Ambulatory Visit | Attending: Internal Medicine | Admitting: Internal Medicine

## 2016-01-06 DIAGNOSIS — D649 Anemia, unspecified: Secondary | ICD-10-CM | POA: Diagnosis present

## 2016-01-06 DIAGNOSIS — R590 Localized enlarged lymph nodes: Secondary | ICD-10-CM | POA: Insufficient documentation

## 2016-01-06 DIAGNOSIS — Z9049 Acquired absence of other specified parts of digestive tract: Secondary | ICD-10-CM | POA: Diagnosis not present

## 2016-01-06 DIAGNOSIS — R7989 Other specified abnormal findings of blood chemistry: Secondary | ICD-10-CM | POA: Insufficient documentation

## 2016-01-06 DIAGNOSIS — R945 Abnormal results of liver function studies: Secondary | ICD-10-CM

## 2016-01-06 MED ORDER — IOPAMIDOL (ISOVUE-300) INJECTION 61%
100.0000 mL | Freq: Once | INTRAVENOUS | Status: AC | PRN
  Administered 2016-01-06: 100 mL via INTRAVENOUS

## 2016-02-11 ENCOUNTER — Telehealth: Payer: Self-pay

## 2016-02-11 ENCOUNTER — Encounter: Payer: Self-pay | Admitting: Urology

## 2016-02-11 ENCOUNTER — Ambulatory Visit (INDEPENDENT_AMBULATORY_CARE_PROVIDER_SITE_OTHER): Payer: Medicare Other | Admitting: Urology

## 2016-02-11 VITALS — BP 150/72 | HR 79 | Ht 70.0 in | Wt 166.7 lb

## 2016-02-11 DIAGNOSIS — Z87898 Personal history of other specified conditions: Secondary | ICD-10-CM

## 2016-02-11 DIAGNOSIS — B372 Candidiasis of skin and nail: Secondary | ICD-10-CM | POA: Diagnosis not present

## 2016-02-11 DIAGNOSIS — R194 Change in bowel habit: Secondary | ICD-10-CM

## 2016-02-11 DIAGNOSIS — N138 Other obstructive and reflux uropathy: Secondary | ICD-10-CM | POA: Diagnosis not present

## 2016-02-11 DIAGNOSIS — N401 Enlarged prostate with lower urinary tract symptoms: Secondary | ICD-10-CM | POA: Diagnosis not present

## 2016-02-11 MED ORDER — TERAZOSIN HCL 5 MG PO CAPS: 5.0000 mg | ORAL_CAPSULE | Freq: Every day | ORAL | 3 refills | Status: DC

## 2016-02-11 MED ORDER — NYSTATIN-TRIAMCINOLONE 100000-0.1 UNIT/GM-% EX OINT: 1.0000 "application " | TOPICAL_OINTMENT | Freq: Two times a day (BID) | CUTANEOUS | 0 refills | Status: DC
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
## 2016-02-11 NOTE — Telephone Encounter (Signed)
mycolog cream rx is not on pt insurance formulary. Pt is requesting another medication. Please advise.

## 2016-02-12 MED ORDER — TRIAMCINOLONE ACETONIDE 0.025 % EX OINT: 1.0000 "application " | TOPICAL_OINTMENT | Freq: Two times a day (BID) | CUTANEOUS | 0 refills | Status: DC

## 2016-02-12 MED ORDER — NYSTATIN 100000 UNIT/GM EX CREA: 1.0000 "application " | TOPICAL_CREAM | Freq: Two times a day (BID) | CUTANEOUS | 0 refills | Status: DC

## 2016-02-12 NOTE — Telephone Encounter (Signed)
We will have to do the nystatin cream and triamcinolone cream separately and he will have to apply both ointments at the same time.  I've sent the scripts to the pharmacy.

## 2016-02-12 NOTE — Telephone Encounter (Signed)
LMOM

## 2016-02-19 NOTE — Telephone Encounter (Signed)
Spoke with pt son in law due to pt being hearing impaired. Made son in law aware of how to use both medications. Son in Social workerlaw voiced understanding.

## 2016-12-16 ENCOUNTER — Encounter: Payer: Self-pay | Admitting: *Deleted

## 2016-12-19 ENCOUNTER — Ambulatory Visit: Payer: Medicare Other | Admitting: Certified Registered Nurse Anesthetist

## 2016-12-19 ENCOUNTER — Ambulatory Visit
Admission: RE | Admit: 2016-12-19 | Discharge: 2016-12-19 | Disposition: A | Discharge status: Home / Self Care | Payer: Medicare Other | Source: Ambulatory Visit | Attending: Unknown Physician Specialty | Admitting: Unknown Physician Specialty

## 2016-12-19 ENCOUNTER — Encounter: Payer: Self-pay | Admitting: *Deleted

## 2016-12-19 ENCOUNTER — Encounter
Admission: RE | Disposition: A | Discharge status: Home / Self Care | Payer: Self-pay | Source: Ambulatory Visit | Attending: Unknown Physician Specialty

## 2016-12-19 DIAGNOSIS — Z79890 Hormone replacement therapy (postmenopausal): Secondary | ICD-10-CM | POA: Insufficient documentation

## 2016-12-19 DIAGNOSIS — K640 First degree hemorrhoids: Secondary | ICD-10-CM | POA: Insufficient documentation

## 2016-12-19 DIAGNOSIS — D649 Anemia, unspecified: Secondary | ICD-10-CM | POA: Insufficient documentation

## 2016-12-19 DIAGNOSIS — K449 Diaphragmatic hernia without obstruction or gangrene: Secondary | ICD-10-CM | POA: Insufficient documentation

## 2016-12-19 DIAGNOSIS — D509 Iron deficiency anemia, unspecified: Secondary | ICD-10-CM | POA: Diagnosis present

## 2016-12-19 DIAGNOSIS — Z79899 Other long term (current) drug therapy: Secondary | ICD-10-CM | POA: Insufficient documentation

## 2016-12-19 DIAGNOSIS — K219 Gastro-esophageal reflux disease without esophagitis: Secondary | ICD-10-CM | POA: Insufficient documentation

## 2016-12-19 DIAGNOSIS — Z7982 Long term (current) use of aspirin: Secondary | ICD-10-CM | POA: Diagnosis not present

## 2016-12-19 HISTORY — PX: ESOPHAGOGASTRODUODENOSCOPY (EGD) WITH PROPOFOL: SHX5813

## 2016-12-19 HISTORY — DX: Gastro-esophageal reflux disease without esophagitis: K21.9

## 2016-12-19 HISTORY — DX: Anemia, unspecified: D64.9

## 2016-12-19 HISTORY — PX: COLONOSCOPY WITH PROPOFOL: SHX5780

## 2016-12-19 HISTORY — DX: Personal history of urinary calculi: Z87.442

## 2016-12-19 LAB — CBC WITH DIFFERENTIAL/PLATELET
Basophils Absolute: 0.1 10*3/uL (ref 0–0.1)
Basophils Relative: 1 %
Eosinophils Absolute: 0 10*3/uL (ref 0–0.7)
Eosinophils Relative: 1 %
HEMATOCRIT: 32.4 % — AB (ref 40.0–52.0)
Hemoglobin: 11.1 g/dL — ABNORMAL LOW (ref 13.0–18.0)
LYMPHS ABS: 1.3 10*3/uL (ref 1.0–3.6)
LYMPHS PCT: 22 %
MCH: 33.5 pg (ref 26.0–34.0)
MCHC: 34.2 g/dL (ref 32.0–36.0)
MCV: 97.8 fL (ref 80.0–100.0)
MONOS PCT: 10 %
Monocytes Absolute: 0.6 10*3/uL (ref 0.2–1.0)
NEUTROS ABS: 4 10*3/uL (ref 1.4–6.5)
NEUTROS PCT: 66 %
Platelets: 106 10*3/uL — ABNORMAL LOW (ref 150–440)
RBC: 3.31 MIL/uL — AB (ref 4.40–5.90)
RDW: 14.6 % — ABNORMAL HIGH (ref 11.5–14.5)
WBC: 6 10*3/uL (ref 3.8–10.6)

## 2016-12-19 LAB — PROTIME-INR
INR: 1
Prothrombin Time: 13.1 seconds (ref 11.4–15.2)

## 2016-12-19 SURGERY — ESOPHAGOGASTRODUODENOSCOPY (EGD) WITH PROPOFOL
Anesthesia: General

## 2016-12-19 MED ORDER — FENTANYL CITRATE (PF) 100 MCG/2ML IJ SOLN
INTRAMUSCULAR | Status: DC | PRN
  Administered 2016-12-19: 50 ug via INTRAVENOUS

## 2016-12-19 MED ORDER — PHENYLEPHRINE HCL 10 MG/ML IJ SOLN
INTRAMUSCULAR | Status: DC | PRN
  Administered 2016-12-19 (×2): 100 ug via INTRAVENOUS
  Administered 2016-12-19: 150 ug via INTRAVENOUS

## 2016-12-19 MED ORDER — PROPOFOL 500 MG/50ML IV EMUL
INTRAVENOUS | Status: DC | PRN
  Administered 2016-12-19: 130 ug/kg/min via INTRAVENOUS

## 2016-12-19 MED ORDER — SODIUM CHLORIDE 0.9 % IV SOLN: INTRAVENOUS | Status: DC

## 2016-12-19 MED ORDER — PROPOFOL 500 MG/50ML IV EMUL
INTRAVENOUS | Status: AC
  Filled 2016-12-19: qty 50

## 2016-12-19 MED ORDER — LIDOCAINE HCL (CARDIAC) 20 MG/ML IV SOLN
INTRAVENOUS | Status: DC | PRN
  Administered 2016-12-19: 100 mg via INTRAVENOUS

## 2016-12-19 MED ORDER — GLYCOPYRROLATE 0.2 MG/ML IJ SOLN
INTRAMUSCULAR | Status: DC | PRN
  Administered 2016-12-19: 0.2 mg via INTRAVENOUS

## 2016-12-19 MED ORDER — PROPOFOL 10 MG/ML IV BOLUS
INTRAVENOUS | Status: DC | PRN
  Administered 2016-12-19: 10 mg via INTRAVENOUS
  Administered 2016-12-19 (×3): 20 mg via INTRAVENOUS
  Administered 2016-12-19: 60 mg via INTRAVENOUS

## 2016-12-19 MED ORDER — GLYCOPYRROLATE 0.2 MG/ML IJ SOLN
INTRAMUSCULAR | Status: AC
  Filled 2016-12-19: qty 1

## 2016-12-19 MED ORDER — SODIUM CHLORIDE 0.9 % IV SOLN
INTRAVENOUS | Status: DC
  Administered 2016-12-19: 10:00:00 via INTRAVENOUS

## 2016-12-19 MED ORDER — FENTANYL CITRATE (PF) 100 MCG/2ML IJ SOLN
INTRAMUSCULAR | Status: AC
  Filled 2016-12-19: qty 2

## 2016-12-19 MED ORDER — LIDOCAINE HCL (PF) 2 % IJ SOLN
INTRAMUSCULAR | Status: AC
  Filled 2016-12-19: qty 10
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
## 2016-12-19 NOTE — Anesthesia Preprocedure Evaluation (Signed)
Anesthesia Evaluation  Patient identified by MRN, date of birth, ID band Patient awake  General Assessment Comment:Patient is deaf  Reviewed: Allergy & Precautions, NPO status , Patient's Chart, lab work & pertinent test results  Airway Mallampati: II       Dental  (+) Upper Dentures, Lower Dentures   Pulmonary neg pulmonary ROS,    breath sounds clear to auscultation       Cardiovascular Exercise Tolerance: Good  Rhythm:Regular Rate:Normal     Neuro/Psych Anxiety    GI/Hepatic Neg liver ROS, GERD  Medicated,  Endo/Other  negative endocrine ROS  Renal/GU   negative genitourinary   Musculoskeletal   Abdominal Normal abdominal exam  (+)   Peds negative pediatric ROS (+)  Hematology  (+) anemia ,   Anesthesia Other Findings   Reproductive/Obstetrics                             Anesthesia Physical Anesthesia Plan  ASA: II  Anesthesia Plan: General   Post-op Pain Management:    Induction: Intravenous  PONV Risk Score and Plan: 0  Airway Management Planned: Natural Airway and Nasal Cannula  Additional Equipment:   Intra-op Plan:   Post-operative Plan:   Informed Consent: I have reviewed the patients History and Physical, chart, labs and discussed the procedure including the risks, benefits and alternatives for the proposed anesthesia with the patient or authorized representative who has indicated his/her understanding and acceptance.     Plan Discussed with: CRNA  Anesthesia Plan Comments:         Anesthesia Quick Evaluation

## 2016-12-19 NOTE — Anesthesia Procedure Notes (Signed)
Performed by: Zakariyah Freimark, CRNA Pre-anesthesia Checklist: Emergency Drugs available, Patient identified, Suction available, Patient being monitored and Timeout performed Patient Re-evaluated:Patient Re-evaluated prior to induction Oxygen Delivery Method: Nasal cannula Induction Type: IV induction       

## 2016-12-19 NOTE — Progress Notes (Signed)
Awaiting CBC results. Notified lab. Blood drawn. MD and patient aware and patient allowed blood to be drawn

## 2016-12-19 NOTE — Op Note (Signed)
[facility] Patient Name: Ronald CedarsFranklin Simek Procedure Date: 12/19/2016 9:44 AM MRN: [id number] Account #: [id number] Date of Birth: [birth year] Admit Type: Outpatient Age: 82 [facility] 3 Gender: Male Note Status: Finalized Procedure:            Colonoscopy Indications:          Unexplained iron deficiency anemia Providers:            Scot Junobert T. Natoshia Souter, MD Referring MD:         Barbette ReichmannVishwanath Hande, MD (Referring MD) Medicines:            Propofol per Anesthesia Complications:        No immediate complications. Procedure:            Pre-Anesthesia Assessment:                       - After reviewing the risks and benefits, the patient                        was deemed in satisfactory condition to undergo the                        procedure.                       After obtaining informed consent, the colonoscope was                        passed under direct vision. Throughout the procedure,                        the patient's blood pressure, pulse, and oxygen                        saturations were monitored continuously. The                        Colonoscope was introduced through the anus and                        advanced to the the cecum, identified by appendiceal                        orifice and ileocecal valve. The colonoscopy was                        performed without difficulty. The patient tolerated the                        procedure well. The quality of the bowel preparation                        was excellent. Findings:      Internal hemorrhoids were found during endoscopy. The hemorrhoids were       small and Grade I (internal hemorrhoids that do not prolapse).      The exam was otherwise without abnormality. Prep was excellent. Impression:           - Internal hemorrhoids.                       - The examination  was otherwise normal.                       - No specimens collected. Recommendation:       - The findings  and recommendations were discussed with                        the patient's family. Consider capsule endoscopy for                        small bowel exam. Scot Junobert T Aishwarya Shiplett, MD 12/19/2016 10:32:56 AM This report has been signed electronically. Number of Addenda: 0 Note Initiated On: 12/19/2016 9:44 AM Scope Withdrawal Time: 0 hours 7 minutes 11 seconds  Total Procedure Duration: 0 hours 14 minutes 3 seconds       [facility]

## 2016-12-19 NOTE — Anesthesia Post-op Follow-up Note (Signed)
Anesthesia QCDR form completed.        

## 2016-12-19 NOTE — Anesthesia Postprocedure Evaluation (Signed)
Anesthesia Post Note  Patient: Ronald Huerta  Procedure(s) Performed: ESOPHAGOGASTRODUODENOSCOPY (EGD) WITH PROPOFOL (N/A ) COLONOSCOPY WITH PROPOFOL (N/A )  Patient location during evaluation: PACU Anesthesia Type: General Level of consciousness: awake Pain management: pain level controlled Vital Signs Assessment: post-procedure vital signs reviewed and stable Respiratory status: spontaneous breathing Cardiovascular status: stable Anesthetic complications: no     Last Vitals:  Vitals:   12/19/16 1114 12/19/16 1124  BP: 129/75 130/78  Pulse: 73 71  Resp: 20 (!) 21  Temp:    SpO2: 99% 98%    Last Pain:  Vitals:   12/19/16 1034  TempSrc: Tympanic                 VAN STAVEREN,Lazer Wollard

## 2016-12-19 NOTE — Transfer of Care (Signed)
Immediate Anesthesia Transfer of Care Note  Patient: Altamese CabalFranklin Delano Coriz  Procedure(s) Performed: ESOPHAGOGASTRODUODENOSCOPY (EGD) WITH PROPOFOL (N/A ) COLONOSCOPY WITH PROPOFOL (N/A )  Patient Location: PACU  Anesthesia Type:General  Level of Consciousness: sedated  Airway & Oxygen Therapy: Patient Spontanous Breathing and Patient connected to face mask oxygen  Post-op Assessment: Report given to RN and Post -op Vital signs reviewed and stable  Post vital signs: Reviewed and stable  Last Vitals:  Vitals:   12/19/16 0920 12/19/16 1034  BP: 126/62 (!) 92/35  Pulse: 65 64  Resp: 16 10  Temp: (!) 36.1 C   SpO2: 100% 98%    Last Pain:  Vitals:   12/19/16 1034  TempSrc: Tympanic         Complications: No apparent anesthesia complications

## 2016-12-19 NOTE — Op Note (Signed)
[facility] Patient Name: Ronald Huerta Procedure Date: 12/19/2016 9:45 AM MRN: [id number] Account #: [id number] Date of Birth: 02-05-1934 Admit Type: Outpatient Age: [age] [facility] Gender: Male Note Status: Finalized Procedure:            Upper GI endoscopy Indications:          Unexplained iron deficiency anemia Providers:            Scot Junobert T. Elliott, MD Referring MD:         Barbette ReichmannVishwanath Hande, MD (Referring MD) Medicines:            Propofol per Anesthesia Complications:        No immediate complications. Procedure:            Pre-Anesthesia Assessment:                       - After reviewing the risks and benefits, the patient                        was deemed in satisfactory condition to undergo the                        procedure.                       After obtaining informed consent, the endoscope was                        passed under direct vision. Throughout the procedure,                        the patient's blood pressure, pulse, and oxygen                        saturations were monitored continuously. The Endoscope                        was introduced through the mouth, and advanced to the                        second part of duodenum. The upper GI endoscopy was                        accomplished without difficulty. The patient tolerated                        the procedure well. Findings:      The examined esophagus was normal. GEJ 41cm.      A small hiatal hernia was present. No inflammation      The stomach was normal.      Patchy mild inflammation characterized by erythema and granularity was       found in the duodenal bulb. Impression:           - Normal esophagus.                       - Small hiatal hernia.                       - Normal stomach.                       -  Normal examined duodenum.                       - No specimens collected. Recommendation:       - Perform a colonoscopy as previously  scheduled. Take                        medicine for inflammed duodenum (first part of the                        small intestine) Scot Junobert T Elliott, MD 12/19/2016 10:14:47 AM This report has been signed electronically. Number of Addenda: 0 Note Initiated On: 12/19/2016 9:45 AM      [facility]

## 2016-12-20 ENCOUNTER — Encounter: Payer: Self-pay | Admitting: Unknown Physician Specialty

## 2017-02-07 ENCOUNTER — Encounter: Payer: Self-pay | Admitting: Urology

## 2017-02-07 ENCOUNTER — Other Ambulatory Visit: Payer: Self-pay

## 2017-02-07 ENCOUNTER — Other Ambulatory Visit: Payer: Medicare Other

## 2017-02-07 DIAGNOSIS — Z87898 Personal history of other specified conditions: Secondary | ICD-10-CM
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
## 2017-02-09 ENCOUNTER — Ambulatory Visit: Payer: Medicare Other | Admitting: Urology

## 2017-02-09 ENCOUNTER — Encounter: Payer: Self-pay | Admitting: Urology

## 2017-02-09 VITALS — BP 174/80 | HR 80 | Ht 70.0 in | Wt 156.0 lb

## 2017-02-09 DIAGNOSIS — N401 Enlarged prostate with lower urinary tract symptoms: Secondary | ICD-10-CM

## 2017-02-09 DIAGNOSIS — Z87898 Personal history of other specified conditions: Secondary | ICD-10-CM | POA: Diagnosis not present

## 2017-02-09 DIAGNOSIS — N138 Other obstructive and reflux uropathy: Secondary | ICD-10-CM

## 2017-02-09 MED ORDER — TERAZOSIN HCL 5 MG PO CAPS: 5.0000 mg | ORAL_CAPSULE | Freq: Every day | ORAL | 3 refills | Status: DC

## 2017-02-10 ENCOUNTER — Telehealth: Payer: Self-pay

## 2017-02-10 LAB — PSA: PROSTATE SPECIFIC AG, SERUM: 2 ng/mL (ref 0.0–4.0)

## 2017-02-10 NOTE — Telephone Encounter (Signed)
-----   Message from Harle BattiestShannon A McGowan, PA-C sent at 02/10/2017  7:38 AM EST ----- Please let Mr. Ronald Huerta know that his PSA is stable at 2.0.  We will see him next year.

## 2017-02-10 NOTE — Telephone Encounter (Signed)
Patient notified on vmail 

## 2017-12-01 ENCOUNTER — Inpatient Hospital Stay
Admission: EM | Admit: 2017-12-01 | Discharge: 2017-12-06 | DRG: 480 | Disposition: A | Discharge status: Skilled Nursing Facility | Payer: Medicare Other | Attending: Internal Medicine | Admitting: Internal Medicine

## 2017-12-01 ENCOUNTER — Other Ambulatory Visit: Payer: Self-pay

## 2017-12-01 ENCOUNTER — Emergency Department: Payer: Medicare Other

## 2017-12-01 ENCOUNTER — Encounter: Payer: Self-pay | Admitting: Specialist

## 2017-12-01 DIAGNOSIS — E039 Hypothyroidism, unspecified: Secondary | ICD-10-CM | POA: Diagnosis present

## 2017-12-01 DIAGNOSIS — R0902 Hypoxemia: Secondary | ICD-10-CM | POA: Diagnosis not present

## 2017-12-01 DIAGNOSIS — Z7982 Long term (current) use of aspirin: Secondary | ICD-10-CM

## 2017-12-01 DIAGNOSIS — R112 Nausea with vomiting, unspecified: Secondary | ICD-10-CM | POA: Diagnosis not present

## 2017-12-01 DIAGNOSIS — I959 Hypotension, unspecified: Secondary | ICD-10-CM | POA: Diagnosis not present

## 2017-12-01 DIAGNOSIS — S72001A Fracture of unspecified part of neck of right femur, initial encounter for closed fracture: Secondary | ICD-10-CM | POA: Diagnosis present

## 2017-12-01 DIAGNOSIS — K219 Gastro-esophageal reflux disease without esophagitis: Secondary | ICD-10-CM | POA: Diagnosis present

## 2017-12-01 DIAGNOSIS — E785 Hyperlipidemia, unspecified: Secondary | ICD-10-CM | POA: Diagnosis present

## 2017-12-01 DIAGNOSIS — W1809XA Striking against other object with subsequent fall, initial encounter: Secondary | ICD-10-CM | POA: Diagnosis present

## 2017-12-01 DIAGNOSIS — J9602 Acute respiratory failure with hypercapnia: Secondary | ICD-10-CM | POA: Diagnosis not present

## 2017-12-01 DIAGNOSIS — R1310 Dysphagia, unspecified: Secondary | ICD-10-CM | POA: Diagnosis present

## 2017-12-01 DIAGNOSIS — M62838 Other muscle spasm: Secondary | ICD-10-CM | POA: Diagnosis present

## 2017-12-01 DIAGNOSIS — R338 Other retention of urine: Secondary | ICD-10-CM | POA: Diagnosis present

## 2017-12-01 DIAGNOSIS — N401 Enlarged prostate with lower urinary tract symptoms: Secondary | ICD-10-CM | POA: Diagnosis present

## 2017-12-01 DIAGNOSIS — H913 Deaf nonspeaking, not elsewhere classified: Secondary | ICD-10-CM | POA: Diagnosis present

## 2017-12-01 DIAGNOSIS — Y92009 Unspecified place in unspecified non-institutional (private) residence as the place of occurrence of the external cause: Secondary | ICD-10-CM

## 2017-12-01 DIAGNOSIS — Z79899 Other long term (current) drug therapy: Secondary | ICD-10-CM

## 2017-12-01 DIAGNOSIS — D62 Acute posthemorrhagic anemia: Secondary | ICD-10-CM | POA: Diagnosis not present

## 2017-12-01 DIAGNOSIS — Z66 Do not resuscitate: Secondary | ICD-10-CM | POA: Diagnosis present

## 2017-12-01 DIAGNOSIS — S72011A Unspecified intracapsular fracture of right femur, initial encounter for closed fracture: Secondary | ICD-10-CM | POA: Diagnosis present

## 2017-12-01 DIAGNOSIS — G2581 Restless legs syndrome: Secondary | ICD-10-CM | POA: Diagnosis present

## 2017-12-01 DIAGNOSIS — I639 Cerebral infarction, unspecified: Secondary | ICD-10-CM

## 2017-12-01 DIAGNOSIS — J9601 Acute respiratory failure with hypoxia: Secondary | ICD-10-CM | POA: Diagnosis not present

## 2017-12-01 DIAGNOSIS — S72009A Fracture of unspecified part of neck of unspecified femur, initial encounter for closed fracture: Secondary | ICD-10-CM

## 2017-12-01 DIAGNOSIS — J9622 Acute and chronic respiratory failure with hypercapnia: Secondary | ICD-10-CM | POA: Diagnosis not present

## 2017-12-01 DIAGNOSIS — J9811 Atelectasis: Secondary | ICD-10-CM

## 2017-12-01 DIAGNOSIS — R4182 Altered mental status, unspecified: Secondary | ICD-10-CM | POA: Diagnosis not present

## 2017-12-01 LAB — BASIC METABOLIC PANEL
Anion gap: 9 (ref 5–15)
BUN: 17 mg/dL (ref 8–23)
CHLORIDE: 103 mmol/L (ref 98–111)
CO2: 28 mmol/L (ref 22–32)
CREATININE: 0.82 mg/dL (ref 0.61–1.24)
Calcium: 8.9 mg/dL (ref 8.9–10.3)
GFR calc non Af Amer: >60 mL/min (ref >60)
Glucose, Bld: 104 mg/dL — ABNORMAL HIGH (ref 70–99)
Potassium: 4.2 mmol/L (ref 3.5–5.1)
Sodium: 140 mmol/L (ref 135–145)

## 2017-12-01 LAB — CBC
HEMATOCRIT: 33.4 % — AB (ref 39.0–52.0)
HEMOGLOBIN: 11.2 g/dL — AB (ref 13.0–17.0)
MCH: 33 pg (ref 26.0–34.0)
MCHC: 33.5 g/dL (ref 30.0–36.0)
MCV: 98.5 fL (ref 80.0–100.0)
Platelets: 160 10*3/uL (ref 150–400)
RBC: 3.39 MIL/uL — ABNORMAL LOW (ref 4.22–5.81)
RDW: 14.4 % (ref 11.5–15.5)
WBC: 4.8 10*3/uL (ref 4.0–10.5)
nRBC: 0 % (ref 0.0–0.2)

## 2017-12-01 MED ORDER — TERAZOSIN HCL 5 MG PO CAPS
5.0000 mg | ORAL_CAPSULE | Freq: Every day | ORAL | Status: DC
  Administered 2017-12-01 – 2017-12-06 (×5): 5 mg via ORAL
  Filled 2017-12-01 (×6): qty 1

## 2017-12-01 MED ORDER — FENTANYL CITRATE (PF) 100 MCG/2ML IJ SOLN
50.0000 ug | Freq: Once | INTRAMUSCULAR | Status: AC
  Administered 2017-12-01: 50 ug via INTRAVENOUS
  Filled 2017-12-01: qty 2

## 2017-12-01 MED ORDER — PROMETHAZINE HCL 25 MG/ML IJ SOLN
12.5000 mg | Freq: Four times a day (QID) | INTRAMUSCULAR | Status: DC | PRN
  Administered 2017-12-03: 12.5 mg via INTRAVENOUS
  Filled 2017-12-01: qty 1

## 2017-12-01 MED ORDER — GABAPENTIN 100 MG PO CAPS
100.0000 mg | ORAL_CAPSULE | Freq: Three times a day (TID) | ORAL | Status: DC
  Administered 2017-12-01 – 2017-12-06 (×12): 100 mg via ORAL
  Filled 2017-12-01 (×13): qty 1

## 2017-12-01 MED ORDER — ONDANSETRON HCL 4 MG/2ML IJ SOLN
INTRAMUSCULAR | Status: AC
  Filled 2017-12-01: qty 2

## 2017-12-01 MED ORDER — ONDANSETRON HCL 4 MG/2ML IJ SOLN
4.0000 mg | Freq: Once | INTRAMUSCULAR | Status: AC
  Administered 2017-12-01: 4 mg via INTRAVENOUS

## 2017-12-01 MED ORDER — FENTANYL CITRATE (PF) 100 MCG/2ML IJ SOLN
INTRAMUSCULAR | Status: AC
  Administered 2017-12-01: 75 ug
  Filled 2017-12-01: qty 2

## 2017-12-01 MED ORDER — SODIUM CHLORIDE 0.9 % IV SOLN
8.0000 mg | Freq: Once | INTRAVENOUS | Status: DC
  Filled 2017-12-01: qty 4

## 2017-12-01 MED ORDER — ACETAMINOPHEN 650 MG RE SUPP: 650.0000 mg | Freq: Four times a day (QID) | RECTAL | Status: DC | PRN

## 2017-12-01 MED ORDER — MORPHINE SULFATE (PF) 2 MG/ML IV SOLN
2.0000 mg | INTRAVENOUS | Status: DC | PRN
  Administered 2017-12-01 – 2017-12-02 (×3): 2 mg via INTRAVENOUS
  Filled 2017-12-01 (×3): qty 1

## 2017-12-01 MED ORDER — ASPIRIN EC 81 MG PO TBEC
81.0000 mg | DELAYED_RELEASE_TABLET | Freq: Every day | ORAL | Status: DC
  Administered 2017-12-01 – 2017-12-06 (×5): 81 mg via ORAL
  Filled 2017-12-01 (×5): qty 1

## 2017-12-01 MED ORDER — BACLOFEN 10 MG PO TABS
5.0000 mg | ORAL_TABLET | Freq: Three times a day (TID) | ORAL | Status: DC
  Administered 2017-12-01 – 2017-12-06 (×12): 5 mg via ORAL
  Filled 2017-12-01 (×16): qty 0.5

## 2017-12-01 MED ORDER — ONDANSETRON HCL 4 MG/2ML IJ SOLN
4.0000 mg | Freq: Four times a day (QID) | INTRAMUSCULAR | Status: DC | PRN
  Administered 2017-12-02 – 2017-12-03 (×2): 4 mg via INTRAVENOUS
  Filled 2017-12-01 (×3): qty 2

## 2017-12-01 MED ORDER — LEVOTHYROXINE SODIUM 25 MCG PO TABS
25.0000 ug | ORAL_TABLET | Freq: Every day | ORAL | Status: DC
  Administered 2017-12-03 – 2017-12-06 (×4): 25 ug via ORAL
  Filled 2017-12-01 (×4): qty 1

## 2017-12-01 MED ORDER — CEFAZOLIN SODIUM-DEXTROSE 1-4 GM/50ML-% IV SOLN
1.0000 g | Freq: Once | INTRAVENOUS | Status: AC
  Administered 2017-12-02: 1 g via INTRAVENOUS
  Filled 2017-12-01: qty 50

## 2017-12-01 MED ORDER — KETOROLAC TROMETHAMINE 15 MG/ML IJ SOLN
15.0000 mg | Freq: Four times a day (QID) | INTRAMUSCULAR | Status: DC | PRN
  Administered 2017-12-01 – 2017-12-05 (×3): 15 mg via INTRAVENOUS
  Filled 2017-12-01 (×3): qty 1

## 2017-12-01 MED ORDER — HYDROMORPHONE HCL 1 MG/ML IJ SOLN
0.5000 mg | INTRAMUSCULAR | Status: AC
  Administered 2017-12-01: 0.5 mg via INTRAVENOUS

## 2017-12-01 MED ORDER — ACETAMINOPHEN 325 MG PO TABS
650.0000 mg | ORAL_TABLET | Freq: Four times a day (QID) | ORAL | Status: DC | PRN
  Administered 2017-12-05 – 2017-12-06 (×2): 650 mg via ORAL
  Filled 2017-12-01 (×2): qty 2

## 2017-12-01 MED ORDER — ONDANSETRON HCL 4 MG PO TABS: 4.0000 mg | ORAL_TABLET | Freq: Four times a day (QID) | ORAL | Status: DC | PRN

## 2017-12-01 MED ORDER — PANTOPRAZOLE SODIUM 40 MG PO TBEC
40.0000 mg | DELAYED_RELEASE_TABLET | Freq: Every day | ORAL | Status: DC
  Administered 2017-12-01 – 2017-12-06 (×5): 40 mg via ORAL
  Filled 2017-12-01 (×5): qty 1

## 2017-12-01 MED ORDER — HYDROMORPHONE HCL 1 MG/ML IJ SOLN
INTRAMUSCULAR | Status: AC
  Filled 2017-12-01: qty 1
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
## 2017-12-01 NOTE — ED Triage Notes (Addendum)
Per [facility], pt fell at home today after tripping on a cord and is complaining of right upper leg pain.  He crawled in the hallway and is Kirke Co40rinw593GuadelKirke Co53rinA789Guadelo6Kirke Co53rinA472GuadeloUAlvester MoriChCorliKirke Co55rin 246Guadelo68UAlveCorlKirke Co74rins224Guadelo682UAlvesCorliss Skai70(256Berdine AKirke Co42rini526Guadelo68UAlvestCKirke Co61rinl46GuaKirke Co82rKirke Co82rinl653Kirke Co38rina566Guadelo68UAlKirke Co27rinKirke Co60rin450Guadelo68UAlCorliss SKirke Co9rini635Guadelo68UAlvesCorliss Skai45(6Kirke Co12rin)677GuadeloUAlCorliKirke Co15rin 775Guadelo68UAlveCorliss Skai55832Kirke Co42rinr425Guadelo682UAlvCorliss Skai720Kirke Co25rine62Guadelo68UAlvesterCorliss Skai41(559Berdine AdKirke Co10rin74Kirke Co34rina649Guadelo68UAlvestCo854-5469629Eulah Jeraldin6Veterinary sMarland Kitc<MEASUREMENKirke Co59rinGuadeUAlveCorliss Skai913BerdKirke Co74rine663Guadelo68UAlvCorliss Skai40223BerdiKirke Co37rin 43Kirke Co92rinu134Guadelo68Kirke Co51rinl457Guadelo68UAKirke Co40rine268Guadelo68UAlvCorKirke Co29rins388Guadelo6829UAlvCorliss SkKirke Co69rin327Guadelo68UAlCorlisKirke Co65rinS640Guadelo68UAlvestCorliss Skai164Kirke Co28rinB54Guadelo68UAKirke Co80rino636Guadelo68UAlCorliss Skai6(Kirke Co22rin0369Guadelo6829Kirke Co38rinl521Guadelo68UAlvester Corliss SkaiKirke Co71rin697Guadelo682UAlvestCoKirke Co84rini862Guadelo68UAlvestCorlisKirke Co69rinS680Guadelo682UAlCKirke Co52rinl574Guadelo6Kirke Co40rinA442Guadelo682UAlvestKirke Co70rinr235Guadelo682UAlCorlKirke Co49rins22GuadelKirke Co46rin8857GuadeloUKirke Co62rinv571Guadelo682UAlKirke Co38rins573Guadelo682UAlvestKirkeKirke Co72rino748Guadelo68UAlvesCorliss SKirke Co63rini164GuadeloUAlCorliss Skai38669Berdine Addi(2425Kirke Co54rin2754Guadelo68UAlCorlKirke Co104rins123Guadelo682UAlKirke Co44rins62Guadelo68UAlvesCorliss Skai479Kirke Co95rinB35Kirke Co65rinu149Guadelo68UAlvesCorlissKirke Co1rink674Guadelo68UAlveKirke Co6rino717GuadeloUAlCorlKirke Co50rins771Guadelo68UACorliss SkKirke Co43rin1661Guadelo682UAlvesCorKirke Co65rins427Guadelo6829UAlvCorlisKirke Co72rinS59Guadelo682UKirke Co36rinv63Guadelo68UAlvesteCorliss Skai632BerdiKirke Co19rin 796GuadeloUAlvesterCorliss Skai44Kirke Co75rin7336Guadelo682UAlvCorliss SkaiKirke Co54rin(646Guadelo6829UKirke Co17rinv34Guadelo682UAlvCoKirke Co37rini85GuadelKirke Co84rin8342Guadelo6829UKirke Co55rinv564Guadelo68UAlveCorliss SkKirke Co49rin224Guadelo682UAlvesterKirke Co52rino17Guadelo68UAlCorliss Skai42681Berdine Addi(([phone] Jeraldin5Veterinary sMarland KitchenurgAmbrose MantleC64wErJeanette Capr6364w2Levert FeiBeverleFrances Fur 

## 2017-12-01 NOTE — ED Notes (Signed)
Pt placed on 2L O2 via [place] at this time d/t O2 sats in the mid 80's to very low 90's.

## 2017-12-01 NOTE — Consult Note (Signed)
Reason for consult is right hip fracture  History: Patient is a 83 year old who tripped over a heater at home landing on his right side.  He had significant pain and difficulty with ambulation and was brought to the emergency room by his son.  He was found to have an impacted femoral neck fracture and is being admitted for treatment of this.  He normally uses a cane to get up from a chair but he can walk without a cane.  He also suffers from a problems with restless leg on the left he says from an old injury.  Right leg does not get the spasms.  He did  On exam the right leg is not shortened or externally rotated he has palpable pulses and sensation appears to be intact.  He is seen with the sign language interpreter on the iPad  X-rays show impacted femoral neck fracture valgus and on the lateral view no posterior or anterior angulation  Impression is right femoral neck fracture impacted  Plan is for hip pinning.  There are risk of avascular necrosis and nonunion and I briefly talked with them about that today but will give papers that explained this further.  I think the alternative of hemiarthroplasty or total joint would be excessive with the impacted nature it appears that is probably a stable fracture pattern.  I did discuss risks of infection and blood clot and the nature of the surgical procedure.  Plan on him being weightbearing as tolerated after surgery and may or may not need postop rehab stay

## 2017-12-01 NOTE — ED Notes (Signed)
Pt is deaf.  Interpreter Amy, [phone], on remote interpreter throughout entire triage.
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
## 2017-12-02 ENCOUNTER — Other Ambulatory Visit: Payer: Self-pay

## 2017-12-02 ENCOUNTER — Encounter
Admission: EM | Disposition: A | Discharge status: Home / Self Care | Payer: Self-pay | Source: Home / Self Care | Attending: Internal Medicine

## 2017-12-02 ENCOUNTER — Inpatient Hospital Stay: Payer: Medicare Other | Admitting: Anesthesiology

## 2017-12-02 ENCOUNTER — Encounter: Payer: Self-pay | Admitting: Anesthesiology

## 2017-12-02 ENCOUNTER — Inpatient Hospital Stay: Payer: Medicare Other

## 2017-12-02 HISTORY — PX: HIP PINNING,CANNULATED: SHX1758

## 2017-12-02 LAB — BASIC METABOLIC PANEL
Anion gap: 7 (ref 5–15)
BUN: 18 mg/dL (ref 8–23)
CO2: 30 mmol/L (ref 22–32)
Calcium: 8.7 mg/dL — ABNORMAL LOW (ref 8.9–10.3)
Chloride: 103 mmol/L (ref 98–111)
Creatinine, Ser: 0.9 mg/dL (ref 0.61–1.24)
GFR calc non Af Amer: >60 mL/min (ref >60)
Glucose, Bld: 134 mg/dL — ABNORMAL HIGH (ref 70–99)
POTASSIUM: 4.5 mmol/L (ref 3.5–5.1)
Sodium: 140 mmol/L (ref 135–145)

## 2017-12-02 LAB — CBC
HCT: 33.2 % — ABNORMAL LOW (ref 39.0–52.0)
HEMOGLOBIN: 11.1 g/dL — AB (ref 13.0–17.0)
MCH: 33.1 pg (ref 26.0–34.0)
MCHC: 33.4 g/dL (ref 30.0–36.0)
MCV: 99.1 fL (ref 80.0–100.0)
NRBC: 0.1 % (ref 0.0–0.2)
Platelets: 146 10*3/uL — ABNORMAL LOW (ref 150–400)
RBC: 3.35 MIL/uL — AB (ref 4.22–5.81)
RDW: 14.2 % (ref 11.5–15.5)
WBC: 19.7 10*3/uL — AB (ref 4.0–10.5)

## 2017-12-02 LAB — SURGICAL PCR SCREEN
MRSA, PCR: NEGATIVE
STAPHYLOCOCCUS AUREUS: NEGATIVE

## 2017-12-02 SURGERY — FIXATION, FEMUR, NECK, PERCUTANEOUS, USING SCREW
Anesthesia: General | Laterality: Right

## 2017-12-02 MED ORDER — EPHEDRINE SULFATE 50 MG/ML IJ SOLN
INTRAMUSCULAR | Status: DC | PRN
  Administered 2017-12-02: 10 mg via INTRAVENOUS

## 2017-12-02 MED ORDER — NEOMYCIN-POLYMYXIN B GU 40-200000 IR SOLN
Status: DC | PRN
  Administered 2017-12-02: 2 mL

## 2017-12-02 MED ORDER — ENOXAPARIN SODIUM 40 MG/0.4ML ~~LOC~~ SOLN
40.0000 mg | SUBCUTANEOUS | Status: DC
  Administered 2017-12-03 – 2017-12-06 (×4): 40 mg via SUBCUTANEOUS
  Filled 2017-12-02 (×4): qty 0.4

## 2017-12-02 MED ORDER — ACETAMINOPHEN 10 MG/ML IV SOLN
INTRAVENOUS | Status: AC
  Filled 2017-12-02: qty 100

## 2017-12-02 MED ORDER — SODIUM CHLORIDE 0.9 % IV SOLN
INTRAVENOUS | Status: DC
  Administered 2017-12-02: 12:00:00 via INTRAVENOUS

## 2017-12-02 MED ORDER — BISACODYL 10 MG RE SUPP
10.0000 mg | Freq: Every day | RECTAL | Status: DC | PRN
  Administered 2017-12-05: 10 mg via RECTAL
  Filled 2017-12-02: qty 1

## 2017-12-02 MED ORDER — MAGNESIUM HYDROXIDE 400 MG/5ML PO SUSP: 30.0000 mL | Freq: Every day | ORAL | Status: DC | PRN

## 2017-12-02 MED ORDER — MAGNESIUM CITRATE PO SOLN
1.0000 | Freq: Once | ORAL | Status: DC | PRN
  Filled 2017-12-02: qty 296

## 2017-12-02 MED ORDER — PROPOFOL 10 MG/ML IV BOLUS
INTRAVENOUS | Status: AC
  Filled 2017-12-02: qty 20

## 2017-12-02 MED ORDER — FENTANYL CITRATE (PF) 100 MCG/2ML IJ SOLN
INTRAMUSCULAR | Status: AC
  Filled 2017-12-02: qty 2

## 2017-12-02 MED ORDER — ONDANSETRON HCL 4 MG/2ML IJ SOLN
INTRAMUSCULAR | Status: DC | PRN
  Administered 2017-12-02: 4 mg via INTRAVENOUS

## 2017-12-02 MED ORDER — DOCUSATE SODIUM 100 MG PO CAPS
100.0000 mg | ORAL_CAPSULE | Freq: Two times a day (BID) | ORAL | Status: DC
  Administered 2017-12-02 – 2017-12-06 (×7): 100 mg via ORAL
  Filled 2017-12-02 (×8): qty 1

## 2017-12-02 MED ORDER — TRAMADOL HCL 50 MG PO TABS
50.0000 mg | ORAL_TABLET | Freq: Four times a day (QID) | ORAL | Status: DC
  Administered 2017-12-02 – 2017-12-03 (×3): 50 mg via ORAL
  Filled 2017-12-02 (×3): qty 1

## 2017-12-02 MED ORDER — SUCCINYLCHOLINE CHLORIDE 20 MG/ML IJ SOLN
INTRAMUSCULAR | Status: AC
  Filled 2017-12-02: qty 1

## 2017-12-02 MED ORDER — SUGAMMADEX SODIUM 200 MG/2ML IV SOLN
INTRAVENOUS | Status: DC | PRN
  Administered 2017-12-02: 150 mg via INTRAVENOUS

## 2017-12-02 MED ORDER — METOCLOPRAMIDE HCL 10 MG PO TABS
5.0000 mg | ORAL_TABLET | Freq: Three times a day (TID) | ORAL | Status: DC | PRN
  Filled 2017-12-02: qty 1

## 2017-12-02 MED ORDER — SODIUM CHLORIDE 0.9 % IJ SOLN
INTRAMUSCULAR | Status: AC
  Filled 2017-12-02: qty 20

## 2017-12-02 MED ORDER — ZOLPIDEM TARTRATE 5 MG PO TABS: 5.0000 mg | ORAL_TABLET | Freq: Every evening | ORAL | Status: DC | PRN

## 2017-12-02 MED ORDER — VASOPRESSIN 20 UNIT/ML IV SOLN
INTRAVENOUS | Status: AC
  Filled 2017-12-02: qty 1

## 2017-12-02 MED ORDER — CEFAZOLIN SODIUM-DEXTROSE 1-4 GM/50ML-% IV SOLN
1.0000 g | Freq: Four times a day (QID) | INTRAVENOUS | Status: AC
  Administered 2017-12-02 – 2017-12-03 (×3): 1 g via INTRAVENOUS
  Filled 2017-12-02 (×3): qty 50

## 2017-12-02 MED ORDER — ACETAMINOPHEN 10 MG/ML IV SOLN
INTRAVENOUS | Status: DC | PRN
  Administered 2017-12-02: 1000 mg via INTRAVENOUS

## 2017-12-02 MED ORDER — PROPOFOL 10 MG/ML IV BOLUS
INTRAVENOUS | Status: DC | PRN
  Administered 2017-12-02: 80 mg via INTRAVENOUS

## 2017-12-02 MED ORDER — SUCCINYLCHOLINE CHLORIDE 20 MG/ML IJ SOLN
INTRAMUSCULAR | Status: DC | PRN
  Administered 2017-12-02: 100 mg via INTRAVENOUS

## 2017-12-02 MED ORDER — ROCURONIUM BROMIDE 100 MG/10ML IV SOLN
INTRAVENOUS | Status: DC | PRN
  Administered 2017-12-02: 25 mg via INTRAVENOUS
  Administered 2017-12-02: 5 mg via INTRAVENOUS

## 2017-12-02 MED ORDER — ONDANSETRON HCL 4 MG/2ML IJ SOLN
INTRAMUSCULAR | Status: AC
  Filled 2017-12-02: qty 2

## 2017-12-02 MED ORDER — VASOPRESSIN 20 UNIT/ML IV SOLN
INTRAVENOUS | Status: DC | PRN
  Administered 2017-12-02 (×2): 2 [IU] via INTRAVENOUS

## 2017-12-02 MED ORDER — BUPIVACAINE HCL (PF) 0.5 % IJ SOLN
INTRAMUSCULAR | Status: DC | PRN
  Administered 2017-12-02: 20 mL

## 2017-12-02 MED ORDER — ALUM & MAG HYDROXIDE-SIMETH 200-200-20 MG/5ML PO SUSP
30.0000 mL | ORAL | Status: DC | PRN
  Filled 2017-12-02: qty 30

## 2017-12-02 MED ORDER — PHENYLEPHRINE HCL 10 MG/ML IJ SOLN
INTRAMUSCULAR | Status: DC | PRN
  Administered 2017-12-02: 100 ug via INTRAVENOUS
  Administered 2017-12-02: 200 ug via INTRAVENOUS
  Administered 2017-12-02 (×2): 100 ug via INTRAVENOUS

## 2017-12-02 MED ORDER — HYDROCODONE-ACETAMINOPHEN 5-325 MG PO TABS: 1.0000 | ORAL_TABLET | ORAL | Status: DC | PRN

## 2017-12-02 MED ORDER — LIDOCAINE HCL (PF) 2 % IJ SOLN
INTRAMUSCULAR | Status: AC
  Filled 2017-12-02: qty 10

## 2017-12-02 MED ORDER — METOCLOPRAMIDE HCL 5 MG/ML IJ SOLN: 5.0000 mg | Freq: Three times a day (TID) | INTRAMUSCULAR | Status: DC | PRN

## 2017-12-02 MED ORDER — LIDOCAINE HCL (CARDIAC) PF 100 MG/5ML IV SOSY
PREFILLED_SYRINGE | INTRAVENOUS | Status: DC | PRN
  Administered 2017-12-02: 100 mg via INTRAVENOUS

## 2017-12-02 MED ORDER — DEXAMETHASONE SODIUM PHOSPHATE 10 MG/ML IJ SOLN
INTRAMUSCULAR | Status: AC
  Filled 2017-12-02: qty 1

## 2017-12-02 MED ORDER — MENTHOL 3 MG MT LOZG
1.0000 | LOZENGE | OROMUCOSAL | Status: DC | PRN
  Filled 2017-12-02: qty 9

## 2017-12-02 MED ORDER — HYDROCODONE-ACETAMINOPHEN 7.5-325 MG PO TABS
1.0000 | ORAL_TABLET | ORAL | Status: DC | PRN
  Administered 2017-12-03: 2 via ORAL
  Filled 2017-12-02: qty 2

## 2017-12-02 MED ORDER — DEXAMETHASONE SODIUM PHOSPHATE 10 MG/ML IJ SOLN
INTRAMUSCULAR | Status: DC | PRN
  Administered 2017-12-02: 8 mg via INTRAVENOUS

## 2017-12-02 MED ORDER — FENTANYL CITRATE (PF) 100 MCG/2ML IJ SOLN
INTRAMUSCULAR | Status: DC | PRN
  Administered 2017-12-02 (×2): 25 ug via INTRAVENOUS

## 2017-12-02 MED ORDER — ROCURONIUM BROMIDE 50 MG/5ML IV SOLN
INTRAVENOUS | Status: AC
  Filled 2017-12-02: qty 1

## 2017-12-02 MED ORDER — SODIUM CHLORIDE 0.9 % IV SOLN
INTRAVENOUS | Status: DC | PRN
  Administered 2017-12-02: 10:00:00 via INTRAVENOUS

## 2017-12-02 MED ORDER — PHENOL 1.4 % MT LIQD
1.0000 | OROMUCOSAL | Status: DC | PRN
  Filled 2017-12-02: qty 177

## 2017-12-02 SURGICAL SUPPLY — 36 items
BIT DRILL CANN LRG QC 5X300 (BIT) ×3 IMPLANT
CANISTER SUCT 1200ML W/VALVE (MISCELLANEOUS) ×3 IMPLANT
CHLORAPREP W/TINT 26ML (MISCELLANEOUS) ×3 IMPLANT
COVER WAND RF STERILE (DRAPES) ×3 IMPLANT
DRSG OPSITE POSTOP 4X6 (GAUZE/BANDAGES/DRESSINGS) ×3 IMPLANT
DRSG TEGADERM 6X8 (GAUZE/BANDAGES/DRESSINGS) IMPLANT
ELECT REM PT RETURN 9FT ADLT (ELECTROSURGICAL) ×3
ELECTRODE REM PT RTRN 9FT ADLT (ELECTROSURGICAL) ×1 IMPLANT
GAUZE PETRO XEROFOAM 1X8 (MISCELLANEOUS) ×3 IMPLANT
GAUZE SPONGE 4X4 12PLY STRL (GAUZE/BANDAGES/DRESSINGS) IMPLANT
GLOVE BIOGEL PI IND STRL 9 (GLOVE) ×2 IMPLANT
GLOVE BIOGEL PI INDICATOR 9 (GLOVE) ×4
GLOVE SURG SYN 9.0  PF PI (GLOVE) ×4
GLOVE SURG SYN 9.0 PF PI (GLOVE) ×2 IMPLANT
GOWN SRG 2XL LVL 4 RGLN SLV (GOWNS) ×1 IMPLANT
GOWN STRL NON-REIN 2XL LVL4 (GOWNS) ×2
GOWN STRL REUS W/ TWL LRG LVL3 (GOWN DISPOSABLE) ×2 IMPLANT
GOWN STRL REUS W/TWL LRG LVL3 (GOWN DISPOSABLE) ×4
GUIDEWIRE THREADED 2.8 (WIRE) ×9 IMPLANT
KIT TURNOVER KIT A (KITS) ×3 IMPLANT
NDL HPO THNWL 1X22GA REG BVL (NEEDLE) ×1 IMPLANT
NEEDLE FILTER BLUNT 18X 1/2SAF (NEEDLE) ×2
NEEDLE FILTER BLUNT 18X1 1/2 (NEEDLE) ×1 IMPLANT
NEEDLE SAFETY 22GX1 (NEEDLE) ×2
NS IRRIG 500ML POUR BTL (IV SOLUTION) ×3 IMPLANT
PACK HIP COMPR (MISCELLANEOUS) ×3 IMPLANT
SCALPEL PROTECTED #10 DISP (BLADE) ×6 IMPLANT
SCREW CANN 32 THRD/105 7.3 (Screw) ×6 IMPLANT
SCREW CANN 32 THRD/95 7.3 (Screw) ×6 IMPLANT
STAPLER SKIN PROX 35W (STAPLE) ×3 IMPLANT
SUT PROLENE 2 0 FS (SUTURE) ×3 IMPLANT
SUT VIC AB 2-0 SH 27 (SUTURE) ×2
SUT VIC AB 2-0 SH 27XBRD (SUTURE) ×1 IMPLANT
SYR 20CC LL (SYRINGE) ×3 IMPLANT
SYR 5ML LL (SYRINGE) ×3 IMPLANT
TAPE MICROFOAM 4IN (TAPE) IMPLANT

## 2017-12-02 NOTE — Progress Notes (Signed)
IS education complete. Instructions translated by friend visiting. Pt understands technique and reason for use. Pt inhale +-. Pt independent with use.

## 2017-12-02 NOTE — Progress Notes (Signed)
Right foot pink in color, warm to touch, good strong pedal pulse.

## 2017-12-02 NOTE — Transfer of Care (Signed)
Immediate Anesthesia Transfer of Care Note  Patient: Ronald Huerta  Procedure(s) Performed: CANNULATED HIP PINNING (Right )  Patient Location: PACU  Anesthesia Type:General  Level of Consciousness: sedated  Airway & Oxygen Therapy: Patient Spontanous Breathing and Patient connected to face mask oxygen  Post-op Assessment: Report given to RN and Post -op Vital signs reviewed and stable  Post vital signs: Reviewed and stable  Last Vitals:  Vitals Value Taken Time  BP 103/40 12/02/2017 10:42 AM  Temp 36.6 C 12/02/2017 10:42 AM  Pulse 75 12/02/2017 10:48 AM  Resp 15 12/02/2017 10:48 AM  SpO2 100 % 12/02/2017 10:48 AM  Vitals shown include unvalidated device data.  Last Pain:  Vitals:   12/02/17 1042  TempSrc:   PainSc: Asleep         Complications: No apparent anesthesia complications

## 2017-12-02 NOTE — Anesthesia Post-op Follow-up Note (Signed)
Anesthesia QCDR form completed.        

## 2017-12-02 NOTE — Anesthesia Preprocedure Evaluation (Signed)
Anesthesia Evaluation  Patient identified by MRN, date of birth, ID band Patient awake  General Assessment Comment:Patient is deaf  Reviewed: Allergy & Precautions, NPO status , Patient's Chart, lab work & pertinent test results  Airway Mallampati: II       Dental  (+) Upper Dentures, Lower Dentures   Pulmonary neg pulmonary ROS,    breath sounds clear to auscultation       Cardiovascular Exercise Tolerance: Good negative cardio ROS   Rhythm:Regular Rate:Normal     Neuro/Psych Anxiety Patient is deaf  Neuromuscular disease    GI/Hepatic Neg liver ROS, GERD  Medicated,  Endo/Other  negative endocrine ROS  Renal/GU   negative genitourinary   Musculoskeletal   Abdominal Normal abdominal exam  (+)   Peds negative pediatric ROS (+)  Hematology  (+) anemia ,   Anesthesia Other Findings Past Medical History: No date: Anemia No date: Anxiety No date: BPH with obstruction/lower urinary tract symptoms No date: Calculus, kidney right: Cellulitis of calf No date: Deaf No date: Elevated PSA No date: GERD (gastroesophageal reflux disease) No date: History of kidney stones No date: HLD (hyperlipidemia) No date: IBS (irritable bowel syndrome) No date: Restless leg syndrome  Reproductive/Obstetrics                             Anesthesia Physical  Anesthesia Plan  ASA: II and emergent  Anesthesia Plan: General   Post-op Pain Management:    Induction: Intravenous  PONV Risk Score and Plan: 0  Airway Management Planned: Oral ETT  Additional Equipment:   Intra-op Plan:   Post-operative Plan: Extubation in OR  Informed Consent: I have reviewed the patients History and Physical, chart, labs and discussed the procedure including the risks, benefits and alternatives for the proposed anesthesia with the patient or authorized representative who has indicated his/her understanding and  acceptance.     Plan Discussed with: CRNA  Anesthesia Plan Comments:         Anesthesia Quick Evaluation

## 2017-12-02 NOTE — Anesthesia Procedure Notes (Signed)
Procedure Name: Intubation Date/Time: 12/02/2017 9:41 AM Performed by: Ginger Carne, CRNA Pre-anesthesia Checklist: Patient identified, Emergency Drugs available, Suction available, Patient being monitored and Timeout performed Patient Re-evaluated:Patient Re-evaluated prior to induction Oxygen Delivery Method: Circle system utilized Preoxygenation: Pre-oxygenation with 100% oxygen Induction Type: IV induction, Rapid sequence and Cricoid Pressure applied Laryngoscope Size: Miller and 2 Grade View: Grade I Tube type: Oral Tube size: 7.5 mm Airway Equipment and Method: Stylet Placement Confirmation: ETT inserted through vocal cords under direct vision,  positive ETCO2 and breath sounds checked- equal and bilateral Secured at: 21 cm Tube secured with: Tape Dental Injury: Teeth and Oropharynx as per pre-operative assessment

## 2017-12-02 NOTE — Plan of Care (Signed)

## 2017-12-02 NOTE — Progress Notes (Signed)
15 minute call to floor. 

## 2017-12-02 NOTE — Op Note (Signed)
12/02/2017  10:35 AM  PATIENT:  Ronald Huerta  83 y.o. male  PRE-OPERATIVE DIAGNOSIS:  Right femoral neck fracture  POST-OPERATIVE DIAGNOSIS:  Right femoral neck fracture  PROCEDURE:  Procedure(s): CANNULATED HIP PINNING (Right)  SURGEON: Leitha Schuller, MD  ASSISTANTS: None  ANESTHESIA:   general  EBL:  Total I/O In: 700 [I.V.:700] Out: -   BLOOD ADMINISTERED:none  DRAINS: none   LOCAL MEDICATIONS USED:  MARCAINE     SPECIMEN:  No Specimen  DISPOSITION OF SPECIMEN:  N/A  COUNTS:  YES  TOURNIQUET:  * No tourniquets in log *  IMPLANTS: 7.3 Synthes cannulated screws x4  DICTATION: .Dragon Dictation patient was brought to the operating room and after adequate general anesthesia was obtained the patient was transferred to the fracture table with the right foot in the traction boot but no traction applied left leg in the well-leg holder.  C arm was brought in and good visualization of the impacted fracture was obtained with no loss of alignment since the admission x-rays.  The hip was then prepped and draped using a barrier drape method.  After appropriate patient identification and timeout procedures were completed, using the C arm to aid in incision planning approximately 1 inch incision was made over the lateral thigh.  The subcutaneous tissue was spread and a soft tissue protector for the guidewire was inserted and a slightly anterior and superior pin was initially placed the multiple pin guide was then placed over this and 3 additional K wires placed for a diamond configuration.  These were sequentially measured drilled tapped and the cannulated screws placed with hand tightening checking position in both AP and lateral projections to make sure is no penetration into the joint.  295 mm screws into 105 mm long threaded screws were placed.  After these had been present placed and permanent serum views obtained the K wires were had been all removed and the wound was  thoroughly irrigated.  20 cc of half percent Sensorcaine plain were infiltrated in the area around the incision to aid in postop analgesia.  The wound was then closed with 2-0 Vicryl subcutaneously and skin staples followed by Xeroform and honeycomb dressing  PLAN OF CARE: Continue as inpatient  PATIENT DISPOSITION:  PACU - hemodynamically stable.

## 2017-12-03 ENCOUNTER — Inpatient Hospital Stay: Payer: Medicare Other

## 2017-12-03 DIAGNOSIS — J9622 Acute and chronic respiratory failure with hypercapnia: Secondary | ICD-10-CM

## 2017-12-03 LAB — URINALYSIS, COMPLETE (UACMP) WITH MICROSCOPIC
BILIRUBIN URINE: NEGATIVE
Bacteria, UA: NONE SEEN
Glucose, UA: NEGATIVE mg/dL
KETONES UR: 5 mg/dL — AB
LEUKOCYTES UA: NEGATIVE
NITRITE: NEGATIVE
PH: 6 (ref 5.0–8.0)
Protein, ur: NEGATIVE mg/dL
Specific Gravity, Urine: 1.019 (ref 1.005–1.030)

## 2017-12-03 LAB — BLOOD GAS, ARTERIAL
ACID-BASE EXCESS: 4.2 mmol/L — AB (ref 0.0–2.0)
BICARBONATE: 31.4 mmol/L — AB (ref 20.0–28.0)
FIO2: 0.28
O2 SAT: 94.9 %
PCO2 ART: 61 mmHg — AB (ref 32.0–48.0)
PO2 ART: 81 mmHg — AB (ref 83.0–108.0)
Patient temperature: 37
pH, Arterial: 7.32 — ABNORMAL LOW (ref 7.350–7.450)

## 2017-12-03 LAB — BASIC METABOLIC PANEL
Anion gap: 5 (ref 5–15)
BUN: 18 mg/dL (ref 8–23)
CALCIUM: 8.4 mg/dL — AB (ref 8.9–10.3)
CO2: 29 mmol/L (ref 22–32)
CREATININE: 0.93 mg/dL (ref 0.61–1.24)
Chloride: 105 mmol/L (ref 98–111)
GFR calc Af Amer: >60 mL/min (ref >60)
GLUCOSE: 121 mg/dL — AB (ref 70–99)
Potassium: 4.2 mmol/L (ref 3.5–5.1)
Sodium: 139 mmol/L (ref 135–145)

## 2017-12-03 LAB — TSH: TSH: 1.525 u[IU]/mL (ref 0.350–4.500)

## 2017-12-03 LAB — CBC
HEMATOCRIT: 30.9 % — AB (ref 39.0–52.0)
Hemoglobin: 10.4 g/dL — ABNORMAL LOW (ref 13.0–17.0)
MCH: 33.2 pg (ref 26.0–34.0)
MCHC: 33.7 g/dL (ref 30.0–36.0)
MCV: 98.7 fL (ref 80.0–100.0)
PLATELETS: 135 10*3/uL — AB (ref 150–400)
RBC: 3.13 MIL/uL — ABNORMAL LOW (ref 4.22–5.81)
RDW: 14.6 % (ref 11.5–15.5)
WBC: 17.5 10*3/uL — ABNORMAL HIGH (ref 4.0–10.5)
nRBC: 0 % (ref 0.0–0.2)

## 2017-12-03 LAB — GLUCOSE, CAPILLARY: Glucose-Capillary: 123 mg/dL — ABNORMAL HIGH (ref 70–99)

## 2017-12-03 MED ORDER — NALOXONE HCL 2 MG/2ML IJ SOSY
1.0000 mg | PREFILLED_SYRINGE | INTRAMUSCULAR | Status: DC | PRN
  Filled 2017-12-03: qty 2

## 2017-12-03 MED ORDER — LACTATED RINGERS IV SOLN
INTRAVENOUS | Status: DC
  Administered 2017-12-03 – 2017-12-04 (×2): via INTRAVENOUS

## 2017-12-03 MED ORDER — ORAL CARE MOUTH RINSE
15.0000 mL | Freq: Two times a day (BID) | OROMUCOSAL | Status: DC
  Administered 2017-12-03: 15 mL via OROMUCOSAL

## 2017-12-03 MED ORDER — NALOXONE HCL 0.4 MG/ML IJ SOLN
0.4000 mg | Freq: Once | INTRAMUSCULAR | Status: AC
  Administered 2017-12-03: 0.4 mg via INTRAVENOUS
  Filled 2017-12-03: qty 1

## 2017-12-03 MED ORDER — CHLORHEXIDINE GLUCONATE 0.12 % MT SOLN
15.0000 mL | Freq: Two times a day (BID) | OROMUCOSAL | Status: DC
  Administered 2017-12-04 – 2017-12-06 (×3): 15 mL via OROMUCOSAL
  Filled 2017-12-03 (×5): qty 15

## 2017-12-03 NOTE — Progress Notes (Signed)
Report given to RN in ICU

## 2017-12-03 NOTE — Progress Notes (Signed)
MD placed orders for narcan, Patient being transported to CT by transport

## 2017-12-03 NOTE — Consult Note (Signed)
Name: Ronald Huerta MRN: [id number] DOB: [birth year]     CONSULTATION DATE: 12/01/2017  HISTORY OF PRESENT ILLNESS:  83 years old gentleman with history of deafness, mutism hypothyroidism, restless leg syndrome, GERD, irritable bowel syndrome, and nephrolithiasis..  Patient is admitted with closed fracture right hip underwent right hip pinning on 12/02/2017.  Earlier he was noticed on the floor to be in altered mental status, CT head was negative for acute intracranial abnormalities.  Case was discussed with Dr.Vachhani, it was noticed that the patient received narcotics earlier, Narcan was administered with no change of neuro status.  ABG reflected acute hypercarbic respiratory failure with poor breathing efforts requiring titration of BiPAP.  All history was obtained from the hospitalist service, family at the bedside and EMR.  Patient arrived to ice unit unresponsive on a BiPAP setting of 10/5 PAST MEDICAL HISTORY :   has a past medical history of Anemia, Anxiety, BPH with obstruction/lower urinary tract symptoms, Calculus, kidney, Cellulitis of calf (right), Deaf, Elevated PSA, GERD (gastroesophageal reflux disease), History of kidney stones, HLD (hyperlipidemia), IBS (irritable bowel syndrome), and Restless leg syndrome.  has a past surgical history that includes Cholecystectomy; Hernia repair (Right); multiple fractures; Fracture surgery; Colonoscopy (05/2009); Esophagogastroduodenoscopy (egd) with propofol (N/A, 12/19/2016); and Colonoscopy with propofol (N/A, 12/19/2016). Prior to Admission medications   Medication Sig Start Date End Date Taking? Authorizing Provider  acetaminophen (TYLENOL) 325 MG tablet Take by mouth.    [provider]  aspirin EC 81 MG tablet Take 81 mg by mouth daily.     [provider]  Biotin 1 MG CAPS Take by mouth.    [provider]  DHA-EPA-Coenzyme Q10-Vitamin E (CO Q-10 VITAMIN E FISH OIL) 60-90-25-200 CAPS Take by mouth.     [provider]  feeding supplement, ENSURE ENLIVE, (ENSURE ENLIVE) LIQD Take 237 mLs by mouth 2 (two) times daily between meals. 04/18/15   Wieting, Richard, MD  ferrous sulfate 325 (65 FE) MG tablet Take by mouth.    [provider]  folic acid (FOLVITE) 400 MCG tablet Take by mouth.    [provider]  gabapentin (NEURONTIN) 100 MG capsule take 1 capsule by mouth three times a day 11/12/15   [provider]  levothyroxine (SYNTHROID, LEVOTHROID) 25 MCG tablet Take 25 mcg by mouth daily before breakfast.  10/07/14 10/07/15  [provider]  levothyroxine (SYNTHROID, LEVOTHROID) 25 MCG tablet Take by mouth. 06/22/15 12/19/16  [provider]  Multiple Vitamin (MULTI-VITAMINS) TABS Take by mouth.    [provider]  nystatin cream (MYCOSTATIN) Apply 1 application topically 2 (two) times daily. 02/12/16   Michiel Cowboy A, PA-C  nystatin-triamcinolone ointment (MYCOLOG) Apply 1 application topically 2 (two) times daily. 02/11/16   Michiel Cowboy A, PA-C  Omega-3 Fatty Acids (FISH OIL PO) Take by mouth.    [provider]  omeprazole (PRILOSEC) 20 MG capsule Take 20 mg by mouth daily.    [provider]  pantoprazole (PROTONIX) 40 MG tablet Take 40 mg daily by mouth.    [provider]  polyethylene glycol-electrolytes (NULYTELY/GOLYTELY) 420 g solution Take 4,000 mLs once by mouth.    [provider]  psyllium (METAMUCIL) 0.52 g capsule Take 0.52 g by mouth daily.    [provider]  Saw Palmetto 450 MG CAPS once daily. Reported on 04/22/2015    [provider]  terazosin (HYTRIN) 5 MG capsule Take 1 capsule (5 mg total) by mouth daily. 02/09/17  Michiel Cowboy A, PA-C  triamcinolone (KENALOG) 0.025 % ointment Apply 1 application topically 2 (two) times daily. Patient not taking: Reported on 02/09/2017 02/12/16   Michiel Cowboy A, PA-C  vitamin B-12 (CYANOCOBALAMIN) 1000 MCG tablet Take  by mouth.    [provider]   No Known Allergies  FAMILY HISTORY:  family history includes Pancreatic cancer in his mother; Prostate cancer in his father. SOCIAL HISTORY:  reports that he has never smoked. He has never used smokeless tobacco. He reports that he does not drink alcohol or use drugs.  REVIEW OF SYSTEMS:   Unable to obtain due to critical illness   VITAL SIGNS: Temp:  [97.6 F (36.4 C)-99.5 F (37.5 C)] 99.5 F (37.5 C) (11/03 1326) Pulse Rate:  [62-90] 71 (11/03 1400) Resp:  [15-23] 23 (11/03 1326) BP: (105-130)/(47-61) 123/58 (11/03 1326) SpO2:  [82 %-98 %] 97 % (11/03 1400) Weight:  [69 kg] 69 kg (11/03 1326)  Physical Examination:  Unresponsive.  Pinpoint pupils, positive corneal reflex, did not respond to sternal rub and no spontaneous movements BiPAP 12/5 titrated up to 14/5 tidal volume remains in the low 200, bilateral equal air entry and no adventitious S1 & S2 are audible with no murmur Benign abdominal exam with feeble peristalsis Of the extremities no leg edema and edema #1 right hip pinning  ASSESSMENT / PLAN:  Acute hypercarbic respiratory failure requiring a BiPAP, initial setting 10/5 with tidal volume 200.  Delta gap was increased with settings of 14/5 tidal volume remains less than 300.  Patient and the family are deaf and mute, with the help of the charge nurse, communication with the family was through writing and they requested DNR as per patient wishes and consider making him comfortable if he started showing signs of distress. -Monitor work of breathing, ABG and optimize vent settings.  Altered mental status with hypercarbic respiratory failure.  CT head negative for acute intracranial abnormalities and patient did not respond to Narcan. -Avoid narcotics, monitor neuro status and continue supportive care  POD #1 right hip pinning status post closed fracture right fifth -Management as per orthopedics  Hypothyroidism Optimize  Synthroid and monitor free T4  Elevated LFTs -Optimize hydration, avoid nephrotoxins and monitor LFTs -Liver ultrasound  Anemia -If hemoglobin more than 7 g/dL  Thrombocytopenia -Monitor platelet count  DNR  DVT & GI prophylaxis.  Continue with supportive care  Critical care time 45 minutes

## 2017-12-03 NOTE — Evaluation (Signed)
Physical Therapy Evaluation Patient Details Name: Ronald Huerta MRN: [id number] DOB: 1934/06/20 Today's Date: 12/03/2017   History of Present Illness  Patient is an 83 year old male admitted s/p R anterior THA.  PMH includes (deafness requiring ASL interpreter), RLS, elevated PSA, HLD, anxiety and anemia.  Clinical Impression  Patient is an 83 year old male who lives in a one story home with his son.  Pt is independent with use of SPC or RW at baseline.  He is deaf and and an ASL interpreter was used during this evaluation.  Pt is experiencing spasmodic loss of control and balance due to what RN believes to be a response to medication administered at 3:00 a.m.  He was very difficulty to awaken initially and was unable to hold a cup without dropping it or sit at the EOB without sudden posterior LOB.  For this reason, transfers and ambulation deferred this evaluation.  Pt did demonstrate fair LE strength with only pain limiting R hip flexion. He was able to perform bed mobility with mod-max A for movement of R LE.  Pt very concerned about spasmodic episodes and PT assured pt, encouraging him to give his body time to recover.  Pt will continue ot benefit from skilled PT with focus on strength, tolerance to activity, balance and safe functional mobility.    Follow Up Recommendations SNF    Equipment Recommendations  None recommended by PT    Recommendations for Other Services       Precautions / Restrictions Precautions Precautions: Anterior Hip Precaution Booklet Issued: Yes (comment) Restrictions Weight Bearing Restrictions: Yes RLE Weight Bearing: Weight bearing as tolerated      Mobility  Bed Mobility Overal bed mobility: Needs Assistance Bed Mobility: Supine to Sit;Sit to Supine     Supine to sit: Mod assist Sit to supine: Mod assist   General bed mobility comments: Assistance with R LE initiation of movement and bringing LE's over EOB.  Pt able to scoot himself up in  bed with cues.  Transfers Overall transfer level: (Deferred due to poor balance.)                  Ambulation/Gait                Stairs            Wheelchair Mobility    Modified Rankin (Stroke Patients Only)       Balance Overall balance assessment: Needs assistance Sitting-balance support: Bilateral upper extremity supported;Feet supported Sitting balance-Leahy Scale: Poor Sitting balance - Comments: Pt experiencing spasmodic loss of balance posteriorly in sitting.  Reports this is the first time this has ever happened to him.  PT reassured pt and encouraged him to remain calm and give everything time to return to normal. Postural control: Posterior lean                                   Pertinent Vitals/Pain Pain Assessment: Faces Faces Pain Scale: Hurts little more Pain Location: R hip Pain Intervention(s): Monitored during session    Home Living Family/patient expects to be discharged to:: Private residence Living Arrangements: Children(Son-at work during the day.) Available Help at Discharge: Family;Available PRN/intermittently Type of Home: House Home Access: Stairs to enter Entrance Stairs-Rails: (One side.  Did not specify.) Entrance Stairs-Number of Steps: 5 Home Layout: One level Home Equipment: Walker - 2 wheels;Cane - single point  Prior Function Level of Independence: Independent with assistive device(s)         Comments: Community ambulator with use of walker and cane.     Hand Dominance        Extremity/Trunk Assessment   Upper Extremity Assessment Upper Extremity Assessment: Generalized weakness    Lower Extremity Assessment Lower Extremity Assessment: Overall WFL for tasks assessed(4/5 bilaterally with exception of hip flexion.  Tested in supine only.  Pt not able to maintain sitting balance at this time.)    Cervical / Trunk Assessment Cervical / Trunk Assessment: Kyphotic  Communication    Communication: Deaf  Cognition Arousal/Alertness: Suspect due to medications Behavior During Therapy: WFL for tasks assessed/performed Overall Cognitive Status: No family/caregiver present to determine baseline cognitive functioning                                 General Comments: Follows commands consistently.      General Comments      Exercises Other Exercises Other Exercises: AAROM SLR x5 BLE, ankle pumps x5 BLE issued HEP handout and encouraged pt to review.  x7 min Other Exercises: Education regarding post-op expectations with rehab x2 min Other Exercises: Assisted pt with use of urinal and discussed options for BSC while pt is not safe to ambulate. x4 min   Assessment/Plan    PT Assessment Patient needs continued PT services  PT Problem List Decreased strength;Decreased mobility;Decreased balance;Decreased activity tolerance;Pain       PT Treatment Interventions DME instruction;Therapeutic activities;Gait training;Therapeutic exercise;Functional mobility training;Stair training;Balance training;Patient/family education;Neuromuscular re-education    PT Goals (Current goals can be found in the Care Plan section)  Acute Rehab PT Goals Patient Stated Goal: To return to general daily activity safely. PT Goal Formulation: With patient Time For Goal Achievement: 12/17/17 Potential to Achieve Goals: Good    Frequency BID   Barriers to discharge        Co-evaluation               AM-PAC PT "6 Clicks" Daily Activity  Outcome Measure Difficulty turning over in bed (including adjusting bedclothes, sheets and blankets)?: A Lot Difficulty moving from lying on back to sitting on the side of the bed? : A Lot Difficulty sitting down on and standing up from a chair with arms (e.g., wheelchair, bedside commode, etc,.)?: Unable Help needed moving to and from a bed to chair (including a wheelchair)?: A Lot Help needed walking in hospital room?: A Lot Help  needed climbing 3-5 steps with a railing? : A Lot 6 Click Score: 11    End of Session   Activity Tolerance: Treatment limited secondary to medical complications (Comment) Patient left: in bed;with call bell/phone within reach;with bed alarm set Nurse Communication: Mobility status PT Visit Diagnosis: Muscle weakness (generalized) (M62.81);Pain Pain - Right/Left: Right Pain - part of body: Hip    Time: 9604-5409 PT Time Calculation (min) (ACUTE ONLY): 32 min   Charges:   PT Evaluation $PT Eval Low Complexity: 1 Low $PT Eval Moderate Complexity: 1 Mod PT Treatments $Therapeutic Activity: 8-22 mins        Glenetta Hew, PT, DPT   Glenetta Hew 12/03/2017, 10:12 AM

## 2017-12-03 NOTE — NC FL2 (Signed)
[place] MEDICAID FL2 LEVEL OF CARE SCREENING TOOL     IDENTIFICATION  Patient Name: Ronald Huerta Birthdate: 07/28/1934 Sex: male Admission Date (Current Location): 12/01/2017  [place] and [place] Number:  Chiropodist and Address:  [facility], [address] [place], [place] [address]      Provider Number: [id number]  Attending Physician Name and Address:  Altamese Dilling, *  Relative Name and Phone Number:  Moshe Cipro (Daughter) [phone], Shaune Pollack (Son-in-law) [phone], [place] Lorusso Hackberry) [phone]    Current Level of Care: Hospital Recommended Level of Care: Skilled Nursing Facility Prior Approval Number:    Date Approved/Denied:   PASRR Number: [id number]  Discharge Plan: SNF    Current Diagnoses: Patient Active Problem List   Diagnosis Date Noted  . Closed right hip fracture (HCC) 12/01/2017  . Cellulitis 04/11/2015  . Cellulitis of leg, left 04/03/2015  . Left leg cellulitis 04/03/2015  . BPH with obstruction/lower urinary tract symptoms 02/10/2015  . History of elevated PSA 02/10/2015    Orientation RESPIRATION BLADDER Height & Weight     Self, Situation, Time, Place  O2(Acute 3L o2) Continent Weight: 153 lb (69.4 kg) Height:  5\' 8"  (172.7 cm)  BEHAVIORAL SYMPTOMS/MOOD NEUROLOGICAL BOWEL NUTRITION STATUS      Continent    AMBULATORY STATUS COMMUNICATION OF NEEDS Skin   Extensive Assist Non-Verbally Surgical wounds                       Personal Care Assistance Level of Assistance  Bathing, Feeding, Dressing Bathing Assistance: Limited assistance Feeding assistance: Independent Dressing Assistance: Limited assistance     Functional Limitations Info  Hearing, Sight, Speech Sight Info: Adequate Hearing Info: Impaired(Patient is deaf and uses sign language.) Speech Info: Impaired(Patient uses sign language)    SPECIAL CARE FACTORS FREQUENCY  PT (By licensed PT)     PT  Frequency: Up to 5X per week              Contractures Contractures Info: Not present    Additional Factors Info  Code Status, Allergies Code Status Info: Full Allergies Info: No known allergies           Current Medications (12/03/2017):  This is the current hospital active medication list Current Facility-Administered Medications  Medication Dose Route Frequency Provider Last Rate Last Dose  . 0.9 %  sodium chloride infusion   Intravenous Continuous [name], MD   Stopped at 12/02/17 1546  . acetaminophen (TYLENOL) tablet 650 mg  650 mg Oral Q6H PRN [name], MD       Or  . acetaminophen (TYLENOL) suppository 650 mg  650 mg Rectal Q6H PRN [name], MD      . alum & mag hydroxide-simeth (MAALOX/MYLANTA) 200-200-20 MG/5ML suspension 30 mL  30 mL Oral Q4H PRN [name], MD      . aspirin EC tablet 81 mg  81 mg Oral Daily [name], MD   81 mg at 12/03/17 0843  . baclofen (LIORESAL) tablet 5 mg  5 mg Oral TID [name], MD   5 mg at 12/03/17 0843  . bisacodyl (DULCOLAX) suppository 10 mg  10 mg Rectal Daily PRN [name], MD      . docusate sodium (COLACE) capsule 100 mg  100 mg Oral BID [name], MD   100 mg at 12/03/17 0843  . enoxaparin (LOVENOX) injection 40 mg  40 mg Subcutaneous Q24H [name], MD  40 mg at 12/03/17 0842  . gabapentin (NEURONTIN) capsule 100 mg  100 mg Oral TID [name], MD   100 mg at 12/03/17 0843  . HYDROcodone-acetaminophen (NORCO) 7.5-325 MG per tablet 1-2 tablet  1-2 tablet Oral Q4H PRN [name], MD   2 tablet at 12/03/17 0239  . HYDROcodone-acetaminophen (NORCO/VICODIN) 5-325 MG per tablet 1-2 tablet  1-2 tablet Oral Q4H PRN [name], MD      . ketorolac (TORADOL) 15 MG/ML injection 15 mg  15 mg Intravenous Q6H PRN [name], MD   15 mg at 12/01/17 2258  . levothyroxine (SYNTHROID, LEVOTHROID) tablet 25 mcg  25 mcg Oral Q0600 [name], MD   25 mcg at 12/03/17 0601  . magnesium citrate  solution 1 Bottle  1 Bottle Oral Once PRN [name], MD      . magnesium hydroxide (MILK OF MAGNESIA) suspension 30 mL  30 mL Oral Daily PRN [name], MD      . menthol-cetylpyridinium (CEPACOL) lozenge 3 mg  1 lozenge Oral PRN [name], MD       Or  . phenol (CHLORASEPTIC) mouth spray 1 spray  1 spray Mouth/Throat PRN [name], MD      . metoCLOPramide (REGLAN) tablet 5-10 mg  5-10 mg Oral Q8H PRN [name], MD       Or  . metoCLOPramide (REGLAN) injection 5-10 mg  5-10 mg Intravenous Q8H PRN [name], MD      . morphine 2 MG/ML injection 2 mg  2 mg Intravenous Q4H PRN [name], MD   2 mg at 12/02/17 2202  . ondansetron (ZOFRAN) 8 mg in sodium chloride 0.9 % 50 mL IVPB  8 mg Intravenous Once [name], MD      . ondansetron [facility]) tablet 4 mg  4 mg Oral Q6H PRN [name], MD       Or  . ondansetron [facility]) injection 4 mg  4 mg Intravenous Q6H PRN [name], MD   4 mg at 12/02/17 2202  . pantoprazole (PROTONIX) EC tablet 40 mg  40 mg Oral Daily [name], MD   40 mg at 12/03/17 0843  . promethazine (PHENERGAN) injection 12.5 mg  12.5 mg Intravenous Q6H PRN [name], MD   12.5 mg at 12/03/17 0310  . terazosin (HYTRIN) capsule 5 mg  5 mg Oral Daily [name], MD   5 mg at 12/03/17 0843  . traMADol (ULTRAM) tablet 50 mg  50 mg Oral Q6H [name], MD   Stopped at 12/03/17 1106  . zolpidem (AMBIEN) tablet 5 mg  5 mg Oral QHS PRN [name], MD         Discharge Medications: Please see discharge summary for a list of discharge medications.  Relevant Imaging Results:  Relevant Lab Results:   Additional Information SS#[id number]  Judi Cong, LCSW

## 2017-12-03 NOTE — Progress Notes (Signed)
RRT called, sleep apnea noted

## 2017-12-03 NOTE — Progress Notes (Signed)
Chaplain responded to a PP. Care team and interpreter were at bedside . Pt is respond to sign and only repeated what is signed show some confusion. Chaplain held space and silent prayer.    12/03/17 1000  Clinical Encounter Type  Visited With Patient;Health care provider  Visit Type Code  Referral From Nurse  Spiritual Encounters  Spiritual Needs Emotional

## 2017-12-03 NOTE — Progress Notes (Signed)
Chaplain was paged to offer support to family but family rejected spiritual support.    12/03/17 1500  Clinical Encounter Type  Visited With Patient and family together  Visit Type Follow-up

## 2017-12-03 NOTE — Progress Notes (Signed)
MD and this RN had conversation ( in writing) with son and daughter, who were updated on patients present condition. They do not want to escalate care at this point and prefer to make him and DNR and keep him comfortable. MD will place DNR order.

## 2017-12-03 NOTE — Progress Notes (Signed)
Patients daughter Delice Bison called, left voicemail (HIPPA compliant). Waiting on callback in order to update family on plan of care.

## 2017-12-03 NOTE — Progress Notes (Signed)
RRT came and assessed patient. Patient placed on Tele with cont pulse ox, UA obtained by in and out cath. Patient still mouth breathing with periods of apnea. BP stable, O2 98 on 2 L. CO 2 elevated 61 and Dr Elisabeth Pigeon aware, MD to place orders.

## 2017-12-03 NOTE — Progress Notes (Addendum)
  Subjective: 1 Day Post-Op Procedure(s) (LRB): CANNULATED HIP PINNING (Right) Patient reports pain as mild.   Patient is sleeping heavily upon entering the room.  Patient is deaf. PT and care management to assist with discharge planning. Negative for chest pain and shortness of breath Fever: no Gastrointestinal:Negative for nausea and vomiting  Objective: Vital signs in last 24 hours: Temp:  [97.5 F (36.4 C)-98.2 F (36.8 C)] 97.6 F (36.4 C) (11/02 2335) Pulse Rate:  [62-81] 81 (11/02 2335) Resp:  [12-25] 19 (11/02 2335) BP: (99-130)/(45-92) 130/59 (11/02 2335) SpO2:  [93 %-100 %] 96 % (11/02 2335)  Intake/Output from previous day:  Intake/Output Summary (Last 24 hours) at 12/03/2017 0849 Last data filed at 12/03/2017 0725 Gross per 24 hour  Intake 1139.97 ml  Output 350 ml  Net 789.97 ml    Intake/Output this shift: Total I/O In: 158.3 [IV Piggyback:158.3] Out: -   Labs: Recent Labs    12/01/17 1706 12/02/17 0429 12/03/17 0438  HGB 11.2* 11.1* 10.4*   Recent Labs    12/02/17 0429 12/03/17 0438  WBC 19.7* 17.5*  RBC 3.35* 3.13*  HCT 33.2* 30.9*  PLT 146* 135*   Recent Labs    12/02/17 0429 12/03/17 0438  NA 140 139  K 4.5 4.2  CL 103 105  CO2 30 29  BUN 18 18  CREATININE 0.90 0.93  GLUCOSE 134* 121*  CALCIUM 8.7* 8.4*   No results for input(s): LABPT, INR in the last 72 hours.   EXAM General - Patient is arousable but extremely drowsy this AM. Extremity - ABD soft Sensation intact distally Dorsiflexion/Plantar flexion intact Incision: dressing C/D/I No cellulitis present Dressing/Incision - clean, dry, no drainage Motor Function - intact, moving foot and toes well on exam.   Past Medical History:  Diagnosis Date  . Anemia   . Anxiety   . BPH with obstruction/lower urinary tract symptoms   . Calculus, kidney   . Cellulitis of calf right  . Deaf   . Elevated PSA   . GERD (gastroesophageal reflux disease)   . History of kidney  stones   . HLD (hyperlipidemia)   . IBS (irritable bowel syndrome)   . Restless leg syndrome     Assessment/Plan: 1 Day Post-Op Procedure(s) (LRB): CANNULATED HIP PINNING (Right) Active Problems:   Closed right hip fracture (HCC)  Estimated body mass index is 23.26 kg/m as calculated from the following:   Height as of this encounter: 5\' 8"  (1.727 m).   Weight as of this encounter: 69.4 kg. Advance diet Up with therapy D/C IV fluids when tolerating po intake.  Labs reviewed this AM, WBC 17.5 but no fevers.  Likely post-op, continue to monitor.  CBC ordered for tomorrow morning. Up with therapy today. Begin working on BM. Care management to assist with discharge planning.  DVT Prophylaxis - Aspirin, Lovenox, Foot Pumps and TED hose Weight-Bearing as tolerated to right leg  J. Horris Latino, PA-C [facility] 12/03/2017, 8:49 AM
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
## 2017-12-03 NOTE — Anesthesia Postprocedure Evaluation (Signed)
Anesthesia Post Note  Patient: Ronald Huerta  Procedure(s) Performed: CANNULATED HIP PINNING (Right )  Patient location during evaluation: PACU Anesthesia Type: General Level of consciousness: awake and alert and oriented Pain management: pain level controlled Vital Signs Assessment: post-procedure vital signs reviewed and stable Respiratory status: spontaneous breathing Cardiovascular status: blood pressure returned to baseline Anesthetic complications: no     Last Vitals:  Vitals:   12/03/17 1800 12/03/17 2000  BP: 140/60 (!) 133/100  Pulse: 87 83  Resp: 11 (!) 22  Temp:  36.8 C  SpO2: 91% 99%    Last Pain:  Vitals:   12/03/17 2000  TempSrc: Axillary  PainSc: 0-No pain                 Yordi Krager

## 2017-12-03 NOTE — Clinical Social Work Note (Signed)
The CSW has attempted to contact the patient's daughter to discuss discharge planning. The CSW left a HIPPA compliant voicemail and is waiting for return call. CSW is following.  Argentina Ponder, MSW, Theresia Majors [phone]
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
## 2017-12-03 NOTE — Progress Notes (Signed)
Patient's son [place] called, no answer

## 2017-12-04 ENCOUNTER — Inpatient Hospital Stay: Payer: Medicare Other

## 2017-12-04 ENCOUNTER — Encounter: Payer: Self-pay | Admitting: Orthopedic Surgery

## 2017-12-04 DIAGNOSIS — R0902 Hypoxemia: Secondary | ICD-10-CM

## 2017-12-04 LAB — CBC WITH DIFFERENTIAL/PLATELET
ABS IMMATURE GRANULOCYTES: 0.05 10*3/uL (ref 0.00–0.07)
BASOS PCT: 1 %
Basophils Absolute: 0.1 10*3/uL (ref 0.0–0.1)
Eosinophils Absolute: 0.5 10*3/uL (ref 0.0–0.5)
Eosinophils Relative: 5 %
HEMATOCRIT: 28.8 % — AB (ref 39.0–52.0)
Hemoglobin: 9.5 g/dL — ABNORMAL LOW (ref 13.0–17.0)
IMMATURE GRANULOCYTES: 1 %
LYMPHS ABS: 1.2 10*3/uL (ref 0.7–4.0)
Lymphocytes Relative: 14 %
MCH: 33.3 pg (ref 26.0–34.0)
MCHC: 33 g/dL (ref 30.0–36.0)
MCV: 101.1 fL — AB (ref 80.0–100.0)
MONO ABS: 0.8 10*3/uL (ref 0.1–1.0)
MONOS PCT: 9 %
NEUTROS ABS: 6.2 10*3/uL (ref 1.7–7.7)
NEUTROS PCT: 70 %
Platelets: 121 10*3/uL — ABNORMAL LOW (ref 150–400)
RBC: 2.85 MIL/uL — ABNORMAL LOW (ref 4.22–5.81)
RDW: 14.6 % (ref 11.5–15.5)
WBC: 8.8 10*3/uL (ref 4.0–10.5)
nRBC: 0 % (ref 0.0–0.2)

## 2017-12-04 LAB — COMPREHENSIVE METABOLIC PANEL
ALK PHOS: 88 U/L (ref 38–126)
ALT: 53 U/L — ABNORMAL HIGH (ref 0–44)
AST: 50 U/L — AB (ref 15–41)
Albumin: 3.1 g/dL — ABNORMAL LOW (ref 3.5–5.0)
Anion gap: 8 (ref 5–15)
BUN: 18 mg/dL (ref 8–23)
CALCIUM: 8.4 mg/dL — AB (ref 8.9–10.3)
CO2: 28 mmol/L (ref 22–32)
Chloride: 104 mmol/L (ref 98–111)
Creatinine, Ser: 0.78 mg/dL (ref 0.61–1.24)
Glucose, Bld: 92 mg/dL (ref 70–99)
Potassium: 4.2 mmol/L (ref 3.5–5.1)
Sodium: 140 mmol/L (ref 135–145)
Total Bilirubin: 0.8 mg/dL (ref 0.3–1.2)
Total Protein: 5.6 g/dL — ABNORMAL LOW (ref 6.5–8.1)

## 2017-12-04 LAB — PHOSPHORUS: PHOSPHORUS: 2.5 mg/dL (ref 2.5–4.6)

## 2017-12-04 LAB — MAGNESIUM: MAGNESIUM: 1.7 mg/dL (ref 1.7–2.4)

## 2017-12-04 MED ORDER — MAGNESIUM SULFATE 2 GM/50ML IV SOLN
2.0000 g | Freq: Once | INTRAVENOUS | Status: AC
  Administered 2017-12-04: 2 g via INTRAVENOUS
  Filled 2017-12-04: qty 50

## 2017-12-04 NOTE — Progress Notes (Signed)
Pharmacy Electrolyte Monitoring Consult:  Pharmacy consulted to assist in monitoring and replacing electrolytes in this 83 y.o. male admitted on 12/01/2017 with fall likely secondary to narcotic abuse; responded Narcan. Patient in ICU with hypercapnic respiratory failure. Patient is s/p cannulated hip pinning.   Labs:  Sodium (mmol/L)  Date Value  12/04/2017 140  12/20/2013 142   Potassium (mmol/L)  Date Value  12/04/2017 4.2  12/20/2013 3.9   Magnesium (mg/dL)  Date Value  16/11/9602 1.7   Phosphorus (mg/dL)  Date Value  54/10/8117 2.5   Calcium (mg/dL)  Date Value  14/78/2956 8.4 (L)   Calcium, Total (mg/dL)  Date Value  21/30/8657 7.7 (L)   Albumin (g/dL)  Date Value  84/69/6295 3.1 (L)  12/14/2013 3.2 (L)    Assessment/Plan: Patient receiving LR at 100 mL/hr; stopped during AM ICU rounds.   Will order magnesium 2g IV x 1. No further replacement warranted.   Pharmacy will continue to monitor and adjust per consult.   Simpson,Michael L 12/04/2017 10:33 AM                          ,
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
## 2017-12-04 NOTE — Progress Notes (Addendum)
Physical Therapy Treatment Patient Details Name: Ronald Huerta MRN: [id number] DOB: Jun 14, 1934 Today's Date: 12/04/2017    History of Present Illness Patient is an 83 year old male admitted s/p R anterior THA.  On 11/3 pt with confusion/AMS and transferred to the ICU.  Pt received narcan with no change in neuro status.  Pt placed on BiPAP due to hypercapnic respiratory failure.  CT negative for acute intracranial abnormalities.  Received new PT orders on 11/4 and per verbal from Dr. Elisabeth Pigeon, PT to work with pt as long as he is alert and not on the BiPAP.  PMH includes (deafness requiring ASL interpreter), RLS, elevated PSA, HLD, anxiety and anemia.    PT Comments    Mr. Banfill was very pleasant and put forth good effort; however, he does fatigue quickly with all aspects of mobility, especially ambulation.  Pt requires up to mod +2 assist for bed mobility, min +2 assist for sit>stand transfers, and min guard to ambulate 10 ft with RW.  HR up to 119 with short distance ambulation.  Otherwise, VSS throughout session.  SNF remains most appropriate d/c plan at this time.     Follow Up Recommendations  SNF     Equipment Recommendations  None recommended by PT    Recommendations for Other Services       Precautions / Restrictions Precautions Precautions: Other (comment) Precaution Booklet Issued: No Precaution Comments: Monitor vitals Restrictions Weight Bearing Restrictions: Yes RLE Weight Bearing: Weight bearing as tolerated    Mobility  Bed Mobility Overal bed mobility: Needs Assistance Bed Mobility: Supine to Sit;Sit to Supine     Supine to sit: Min assist;HOB elevated Sit to supine: Mod assist;+2 for physical assistance   General bed mobility comments: Min assist to elevate trunk for supine>sit.  Mod +2 assist to return to supine from sit to guide trunk and to bring LEs into bed.  Transfers Overall transfer level: Needs assistance Equipment used: Rolling walker (2  wheeled) Transfers: Sit to/from Stand Sit to Stand: Min assist;+2 physical assistance         General transfer comment: Cues for proper hand placement.  Assist to boost to standing.   Ambulation/Gait Ambulation/Gait assistance: Min guard;+2 safety/equipment Gait Distance (Feet): 10 Feet(5 forward, 5 back) Assistive device: Rolling walker (2 wheeled) Gait Pattern/deviations: Decreased step length - right;Decreased step length - left;Trunk flexed Gait velocity: decreased Gait velocity interpretation: <1.31 ft/sec, indicative of household ambulator General Gait Details: Trunk flexed as pt ambulates 5 ft forward and then 5 ft back to bed.  Pt fatigues quickly.  Pt does take several side steps at bedside prior to sitting.    Stairs             Wheelchair Mobility    Modified Rankin (Stroke Patients Only)       Balance Overall balance assessment: Needs assistance Sitting-balance support: Single extremity supported;Feet supported Sitting balance-Leahy Scale: Poor Sitting balance - Comments: Relies on at least 1UE support while sitting EOB   Standing balance support: Bilateral upper extremity supported;During functional activity Standing balance-Leahy Scale: Poor Standing balance comment: Relies on BUE support for static and dynamic activities                            Cognition Arousal/Alertness: Awake/alert Behavior During Therapy: WFL for tasks assessed/performed Overall Cognitive Status: Difficult to assess  Exercises Total Joint Exercises Ankle Circles/Pumps: AROM;Both;Supine;15 reps Hip ABduction/ADduction: AAROM;Right;10 reps;Supine Straight Leg Raises: AAROM;Right;5 reps;Supine Marching in Standing: Both;5 reps;Standing    General Comments General comments (skin integrity, edema, etc.): Interpreter: Adriana using VRI.  Pt expresses preference for interpreter to communicate to pt's daughter  and then the daughter to relay the information to the pt.  Pt has a hard time watching the interpreter on the VRI screen.       Pertinent Vitals/Pain Pain Assessment: 0-10 Pain Score: 8  Pain Location: R hip Pain Descriptors / Indicators: Grimacing;Guarding;Discomfort Pain Intervention(s): Limited activity within patient's tolerance;Monitored during session;Repositioned;Utilized relaxation techniques    Home Living                      Prior Function            PT Goals (current goals can now be found in the care plan section) Acute Rehab PT Goals Patient Stated Goal: to improve mobility PT Goal Formulation: With patient Time For Goal Achievement: 12/17/17 Potential to Achieve Goals: Good Progress towards PT goals: Progressing toward goals    Frequency    7X/week      PT Plan Frequency needs to be updated(due to recent change in medical status)    Co-evaluation              AM-PAC PT "6 Clicks" Daily Activity  Outcome Measure  Difficulty turning over in bed (including adjusting bedclothes, sheets and blankets)?: Unable Difficulty moving from lying on back to sitting on the side of the bed? : Unable Difficulty sitting down on and standing up from a chair with arms (e.g., wheelchair, bedside commode, etc,.)?: Unable Help needed moving to and from a bed to chair (including a wheelchair)?: A Little Help needed walking in hospital room?: A Little Help needed climbing 3-5 steps with a railing? : Total 6 Click Score: 10    End of Session Equipment Utilized During Treatment: Gait belt Activity Tolerance: Patient limited by fatigue Patient left: in bed;with call bell/phone within reach;with bed alarm set;with SCD's reapplied;with family/visitor present Nurse Communication: Mobility status PT Visit Diagnosis: Muscle weakness (generalized) (M62.81);Pain Pain - Right/Left: Right Pain - part of body: Hip     Time: 1610-9604 PT Time Calculation (min) (ACUTE  ONLY): 37 min  Charges:  $Therapeutic Exercise: 8-22 mins $Therapeutic Activity: 8-22 mins                     [name] PT, DPT 12/04/2017, 12:36 PM
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
## 2017-12-04 NOTE — Progress Notes (Signed)
Follow up - Critical Care Medicine Note  Patient Details:    Ronald Huerta is an 83 y.o. male. with history of deafness, mutism hypothyroidism, restless leg syndrome, GERD, irritable bowel syndrome, and nephrolithiasis..  Patient is admitted with closed fracture right hip underwent right hip pinning on 12/02/2017.  Earlier he was noticed on the floor to be in altered mental status, CT head was negative for acute intracranial abnormalities.  Patient received narcotics earlier, Narcan was administered with no change of neuro status.  ABG reflected acute hypercarbic respiratory failure with poor breathing efforts requiring titration of BiPAP.    Lines, Airways, Drains: External Urinary Catheter (Active)  Collection Container Standard drainage bag 12/03/2017  8:00 PM  Securement Method Securing device (Describe) 12/03/2017  8:00 PM    Anti-infectives:  Anti-infectives (From admission, onward)   Start     Dose/Rate Route Frequency Ordered Stop   12/02/17 1600  ceFAZolin (ANCEF) IVPB 1 g/50 mL premix     1 g 100 mL/hr over 30 Minutes Intravenous Every 6 hours 12/02/17 1140 12/04/17 0051   12/02/17 1300  ceFAZolin (ANCEF) IVPB 1 g/50 mL premix     1 g 100 mL/hr over 30 Minutes Intravenous  Once 12/01/17 1850 12/02/17 1015      Microbiology: Results for orders placed or performed during the hospital encounter of 12/01/17  Surgical pcr screen     Status: None   Collection Time: 12/02/17  5:34 AM  Result Value Ref Range Status   MRSA, PCR NEGATIVE NEGATIVE Final   Staphylococcus aureus NEGATIVE NEGATIVE Final    Comment: (NOTE) The Xpert SA Assay (FDA approved for NASAL specimens in patients 62 years of age and older), is one component of a comprehensive surveillance program. It is not intended to diagnose infection nor to guide or monitor treatment. Performed at [facility], [address] [place], [place] [address]    Studies: Dg Chest 1 View  Result Date:  12/02/2017 CLINICAL DATA:  83 year old male with a history of hypoxia EXAM: CHEST  1 VIEW COMPARISON:  None. FINDINGS: Cardiomediastinal silhouette unchanged in size and contour. No pneumothorax.  No pleural effusion. Coarsened interstitial markings similar to prior. No new confluent airspace disease. No displaced fracture. IMPRESSION: Chronic lung changes without evidence of superimposed acute cardiopulmonary disease Electronically Signed   By: Gilmer Mor D.O.   On: 12/02/2017 15:34   Dg Pelvis 1-2 Views  Result Date: 12/01/2017 CLINICAL DATA:  Fall today with right-sided pelvic pain, initial encounter EXAM: PELVIS - 1-2 VIEW COMPARISON:  None. FINDINGS: Pelvic ring is intact. There is some angulation beneath the femoral neck laterally as well as some disrupted trabecular pattern suggestive of an impacted femoral neck fracture. This is better visualized on the femoral films. No soft tissue abnormality is noted. IMPRESSION: Subcapital right femoral neck fracture. Electronically Signed   By: Alcide Clever M.D.   On: 12/01/2017 18:06   Ct Head Wo Contrast  Result Date: 12/03/2017 CLINICAL DATA:  Altered mental status EXAM: CT HEAD WITHOUT CONTRAST TECHNIQUE: Contiguous axial images were obtained from the base of the skull through the vertex without intravenous contrast. COMPARISON:  CT 12/01/2017 FINDINGS: Brain: Image quality degraded by motion. Negative for acute infarct, hemorrhage, or mass lesion. No change from the prior study. Vascular: Negative for hyperdense vessel Skull: Negative Sinuses/Orbits: Negative Other: None IMPRESSION: No acute abnormality and no change from 2 days ago. Electronically Signed   By: Marlan Palau M.D.   On: 12/03/2017 12:59  Ct Head Wo Contrast  Result Date: 12/01/2017 CLINICAL DATA:  Patient fell at home tripping on a cord. Head trauma. EXAM: CT HEAD WITHOUT CONTRAST CT CERVICAL SPINE WITHOUT CONTRAST TECHNIQUE: Multidetector CT imaging of the head and cervical spine  was performed following the standard protocol without intravenous contrast. Multiplanar CT image reconstructions of the cervical spine were also generated. COMPARISON:  Head CT 01/07/2015 FINDINGS: CT HEAD FINDINGS BRAIN: There are age related involutional changes of the brain with mild sulcal and ventricular prominence. No intraparenchymal hemorrhage, mass effect nor midline shift. Minimal chronic periventricular and subcortical white matter hypodensities consistent with small vessel ischemic disease are identified. No acute large vascular territory infarcts. No abnormal extra-axial fluid collections. Basal cisterns are not effaced and midline. The brainstem and cerebellum are nonacute. VASCULAR: Mild atherosclerosis of the carotid siphons. Hyperdense vessels. SKULL: No skull fracture. No significant scalp soft tissue swelling. SINUSES/ORBITS: The mastoid air-cells are clear. The included paranasal sinuses are well-aerated.The included ocular globes and orbital contents are non-suspicious. OTHER: None. CT CERVICAL SPINE FINDINGS Alignment: Maintained cervical lordosis. Skull base and vertebrae: No acute fracture. No primary bone lesion or focal pathologic process. Soft tissues and spinal canal: No prevertebral fluid or swelling. No visible canal hematoma. Disc levels: Marked disc flattening C3-4 and C4-5 with small posterior marginal osteophytes. There is uncovertebral joint osteoarthritis bilaterally from C3 through C7. There is slight C4-5 and C5-6 right-sided foraminal encroachment from uncinate spurring. No jumped or perched facets. Upper chest: Negative. Other: None IMPRESSION: No acute intracranial abnormality. Chronic minimal small vessel ischemic disease with age related involutional changes of the brain. No acute skull fracture. Cervical spondylosis with marked disc flattening at C3-4 and C4-5. No acute cervical spine fracture or static listhesis. Electronically Signed   By: Tollie Eth M.D.   On:  12/01/2017 17:36   Ct Cervical Spine Wo Contrast  Result Date: 12/01/2017 CLINICAL DATA:  Patient fell at home tripping on a cord. Head trauma. EXAM: CT HEAD WITHOUT CONTRAST CT CERVICAL SPINE WITHOUT CONTRAST TECHNIQUE: Multidetector CT imaging of the head and cervical spine was performed following the standard protocol without intravenous contrast. Multiplanar CT image reconstructions of the cervical spine were also generated. COMPARISON:  Head CT 01/07/2015 FINDINGS: CT HEAD FINDINGS BRAIN: There are age related involutional changes of the brain with mild sulcal and ventricular prominence. No intraparenchymal hemorrhage, mass effect nor midline shift. Minimal chronic periventricular and subcortical white matter hypodensities consistent with small vessel ischemic disease are identified. No acute large vascular territory infarcts. No abnormal extra-axial fluid collections. Basal cisterns are not effaced and midline. The brainstem and cerebellum are nonacute. VASCULAR: Mild atherosclerosis of the carotid siphons. Hyperdense vessels. SKULL: No skull fracture. No significant scalp soft tissue swelling. SINUSES/ORBITS: The mastoid air-cells are clear. The included paranasal sinuses are well-aerated.The included ocular globes and orbital contents are non-suspicious. OTHER: None. CT CERVICAL SPINE FINDINGS Alignment: Maintained cervical lordosis. Skull base and vertebrae: No acute fracture. No primary bone lesion or focal pathologic process. Soft tissues and spinal canal: No prevertebral fluid or swelling. No visible canal hematoma. Disc levels: Marked disc flattening C3-4 and C4-5 with small posterior marginal osteophytes. There is uncovertebral joint osteoarthritis bilaterally from C3 through C7. There is slight C4-5 and C5-6 right-sided foraminal encroachment from uncinate spurring. No jumped or perched facets. Upper chest: Negative. Other: None IMPRESSION: No acute intracranial abnormality. Chronic minimal small  vessel ischemic disease with age related involutional changes of the brain. No acute skull fracture. Cervical spondylosis  with marked disc flattening at C3-4 and C4-5. No acute cervical spine fracture or static listhesis. Electronically Signed   By: Tollie Eth M.D.   On: 12/01/2017 17:36   Dg Chest Port 1 View  Result Date: 12/04/2017 CLINICAL DATA:  Follow-up atelectasis, chronic lung disease. EXAM: PORTABLE CHEST 1 VIEW COMPARISON:  Chest x-ray of December 02, 2017 FINDINGS: The lungs are well-expanded. The interstitial markings are less conspicuous overall today. There is no alveolar infiltrate or pleural effusion. The heart and pulmonary vascularity are normal. The mediastinum is normal in width. There is calcification in the wall of the aortic arch. There is mild degenerative disc disease of the thoracic spine. IMPRESSION: Improved appearance of the pulmonary interstitium which likely reflects resolving acute bronchitic change. There is no alveolar pneumonia nor pulmonary edema. Thoracic aortic atherosclerosis. Electronically Signed   By: David  [place]aziland M.D.   On: 12/04/2017 07:12   Dg Hip Operative Unilat W Or W/o Pelvis Right  Result Date: 12/02/2017 CLINICAL DATA:  83 year old male with a history of hip fracture EXAM: OPERATIVE RIGHT HIP (WITH PELVIS IF PERFORMED)  VIEWS TECHNIQUE: Fluoroscopic spot image(s) were submitted for interpretation post-operatively. COMPARISON:  12/01/2017 FINDINGS: Intraoperative fluoroscopic spot images of the right hip demonstrate cannulated screw fixation of right femoral neck/subcapital fracture. IMPRESSION: Limited intraoperative fluoroscopic spot images of the right hip demonstrating ORIF of known femoral neck fracture. Electronically Signed   By: Gilmer Mor D.O.   On: 12/02/2017 10:43   Dg Femur Min 2 Views Right  Result Date: 12/01/2017 CLINICAL DATA:  Recent fall with leg pain on the right, initial encounter EXAM: RIGHT FEMUR 2 VIEWS COMPARISON:  None.  FINDINGS: There is a right subcapital femoral neck fracture with impaction at the fracture site. The more distal femur appears within normal limits. No soft tissue changes are noted. IMPRESSION: Right subcapital femoral neck fracture Electronically Signed   By: Alcide Clever M.D.   On: 12/01/2017 18:06    Consults: Treatment Team:  [name], MD Kym Groom, MD Thana Farr, MD   Subjective:    Overnight Issues: Patient is awake alert resting comfortably without shortness of breath this morning  Objective:  Vital signs for last 24 hours: Temp:  [97.7 F (36.5 C)-99.5 F (37.5 C)] 99.1 F (37.3 C) (11/04 0900) Pulse Rate:  [63-91] 91 (11/04 0900) Resp:  [8-28] 19 (11/04 0900) BP: (106-158)/(51-105) 146/85 (11/04 0900) SpO2:  [82 %-100 %] 97 % (11/04 0900) Weight:  [69 kg] 69 kg (11/03 1326)  Hemodynamic parameters for last 24 hours:    Intake/Output from previous day: 11/03 0701 - 11/04 0700 In: 847.1 [I.V.:688.8; IV Piggyback:158.3] Out: 700 [Urine:700]  Intake/Output this shift: No intake/output data recorded.  Vent settings for last 24 hours:    Physical Exam:  Vital signs: Please see the above listed vital signs HEENT:  Trachea midline, no oral lesions appreciated, no jugular venous distention noted Cardiovascular: Regular rate and rhythm Pulmonary: Clear to auscultation Abdominal: Positive bowel sounds, soft exam Extremities: No edema appreciated Neurologic: Patient moves all extremities no focal deficits noted  Assessment/Plan:   Hypercapnic respiratory failure.  Felt to be most likely secondary to narcotic use, has been weaned off of BiPAP, CT scan of the head was negative, improved mentation and communication.  If patient is able to continue tolerating nasal cannula will consider floor transfer  Status post right hip pinning close fracture per orthopedics  Hypothyroidism.  Patient on replacement  Anemia.  Hemoglobin stable no evidence of  active bleeding   Ronald Huerta 12/04/2017  *Care during the described time interval was provided by me and/or other providers on the critical care team.  I have reviewed this patient's available data, including medical history, events of note, physical examination and test results as part of my evaluation.

## 2017-12-04 NOTE — Progress Notes (Signed)
   Subjective: 2 Days Post-Op Procedure(s) (LRB): CANNULATED HIP PINNING (Right) Communicated with patient via typing/pen paper.  Patient states pain is mild in right hip. He admits to feeling confused.  Denies any CP, SOB, ABD pain.  Objective: Vital signs in last 24 hours: Temp:  [97.7 F (36.5 C)-99.5 F (37.5 C)] 97.7 F (36.5 C) (11/04 0400) Pulse Rate:  [63-90] 63 (11/04 0400) Resp:  [8-23] 10 (11/04 0400) BP: (106-147)/(51-105) 131/64 (11/04 0400) SpO2:  [82 %-100 %] 98 % (11/04 0400) Weight:  [69 kg] 69 kg (11/03 1326)  Intake/Output from previous day: 11/03 0701 - 11/04 0700 In: 847.1 [I.V.:688.8; IV Piggyback:158.3] Out: 700 [Urine:700] Intake/Output this shift: No intake/output data recorded.  Recent Labs    12/01/17 1706 12/02/17 0429 12/03/17 0438 12/04/17 0349  HGB 11.2* 11.1* 10.4* 9.5*   Recent Labs    12/03/17 0438 12/04/17 0349  WBC 17.5* 8.8  RBC 3.13* 2.85*  HCT 30.9* 28.8*  PLT 135* 121*   Recent Labs    12/03/17 0438 12/04/17 0349  NA 139 140  K 4.2 4.2  CL 105 104  CO2 29 28  BUN 18 18  CREATININE 0.93 0.78  GLUCOSE 121* 92  CALCIUM 8.4* 8.4*   No results for input(s): LABPT, INR in the last 72 hours.  EXAM General - Patient is Alert and Appropriate Patient slightly confused. Thought he was in [place].  Extremity - Sensation intact distally Intact pulses distally Dorsiflexion/Plantar flexion intact No cellulitis present Compartment soft  Calf soft. Dressing - dressing C/D/I and scant drainage Motor Function - intact, moving foot and toes well on exam.   Past Medical History:  Diagnosis Date  . Anemia   . Anxiety   . BPH with obstruction/lower urinary tract symptoms   . Calculus, kidney   . Cellulitis of calf right  . Deaf   . Elevated PSA   . GERD (gastroesophageal reflux disease)   . History of kidney stones   . HLD (hyperlipidemia)   . IBS (irritable bowel syndrome)   . Restless leg syndrome      Assessment/Plan:   2 Days Post-Op Procedure(s) (LRB): CANNULATED HIP PINNING (Right) Active Problems:   Closed right hip fracture (HCC)  Estimated body mass index is 23.13 kg/m as calculated from the following:   Height as of this encounter: 5\' 8"  (1.727 m).   Weight as of this encounter: 69 kg. Advance diet  Possibly up with therapy today if patient stable and transitions back to ortho floor Continue to hold narcotics.  DVT Prophylaxis - Aspirin, Lovenox and TED hose Weight-Bearing as tolerated to right leg   T. Cranston Neighbor, PA-C [facility] 12/04/2017, 8:06 AM

## 2017-12-04 NOTE — Progress Notes (Signed)
Speech Therapy note: reviewed chart notes; consulted NSG who stated pt reported difficulty swallowing pills. Due to concern for swallowing issues, MD ordered a MBSS late this PM. Due to scheduling issues this late in the day, SLP will f/u w/ pt in the morning for need for MBSS. Until then, SLP modified the diet consistency for any potential swallowing difficulty/dysphagia; Pills Crushed in Puree(as this was his main concern per NSG) NSG agreed. ST will f/u in the morning.    Jerilynn Som, MS, CCC-SLP

## 2017-12-05 ENCOUNTER — Inpatient Hospital Stay
Admit: 2017-12-05 | Discharge: 2017-12-05 | Disposition: A | Discharge status: Home / Self Care | Payer: Medicare Other | Attending: Internal Medicine | Admitting: Internal Medicine

## 2017-12-05 LAB — BASIC METABOLIC PANEL
Anion gap: 4 — ABNORMAL LOW (ref 5–15)
BUN: 13 mg/dL (ref 8–23)
CHLORIDE: 105 mmol/L (ref 98–111)
CO2: 33 mmol/L — AB (ref 22–32)
Calcium: 8 mg/dL — ABNORMAL LOW (ref 8.9–10.3)
Creatinine, Ser: 0.71 mg/dL (ref 0.61–1.24)
GFR calc non Af Amer: >60 mL/min (ref >60)
Glucose, Bld: 93 mg/dL (ref 70–99)
POTASSIUM: 3.6 mmol/L (ref 3.5–5.1)
SODIUM: 142 mmol/L (ref 135–145)

## 2017-12-05 LAB — AMMONIA: AMMONIA: 30 umol/L (ref 9–35)

## 2017-12-05 LAB — MAGNESIUM: MAGNESIUM: 1.9 mg/dL (ref 1.7–2.4)

## 2017-12-05 MED ORDER — FOLIC ACID 1 MG PO TABS
1.0000 mg | ORAL_TABLET | Freq: Every day | ORAL | Status: DC
  Administered 2017-12-06: 1 mg via ORAL
  Filled 2017-12-05: qty 1

## 2017-12-05 MED ORDER — FERROUS SULFATE 325 (65 FE) MG PO TABS
325.0000 mg | ORAL_TABLET | Freq: Every day | ORAL | Status: DC
  Administered 2017-12-06: 325 mg via ORAL
  Filled 2017-12-05: qty 1

## 2017-12-05 MED ORDER — VITAMIN B-12 1000 MCG PO TABS
1000.0000 ug | ORAL_TABLET | Freq: Every day | ORAL | Status: DC
  Administered 2017-12-06: 1000 ug via ORAL
  Filled 2017-12-05: qty 1
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
## 2017-12-05 NOTE — Plan of Care (Signed)
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
## 2017-12-05 NOTE — Clinical Social Work Placement (Signed)
   CLINICAL SOCIAL WORK PLACEMENT  NOTE  Date:  12/05/2017  Patient Details  Name: Ronald Huerta MRN: [id number] Date of Birth: 1934-09-06  Clinical Social Work is seeking post-discharge placement for this patient at the Skilled  Nursing Facility level of care (*CSW will initial, date and re-position this form in  chart as items are completed):  Yes   Patient/family provided with [place] Clinical Social Work Department's list of facilities offering this level of care within the geographic area requested by the patient (or if unable, by the patient's family).  Yes   Patient/family informed of their freedom to choose among providers that offer the needed level of care, that participate in Medicare, Medicaid or managed care program needed by the patient, have an available bed and are willing to accept the patient.  Yes   Patient/family informed of [facility]'s ownership interest in [facility] and [facility], as well as of the fact that they are under no obligation to receive care at these facilities.  PASRR submitted to EDS on 12/03/17     PASRR number received on 12/03/17     Existing PASRR number confirmed on       FL2 transmitted to all facilities in geographic area requested by pt/family on 12/03/17     FL2 transmitted to all facilities within larger geographic area on       Patient informed that his/her managed care company has contracts with or will negotiate with certain facilities, including the following:        Yes   Patient/family informed of bed offers received.  Patient chooses bed at ([facility] )     Physician recommends and patient chooses bed at      Patient to be transferred to   on  .  Patient to be transferred to facility by       Patient family notified on   of transfer.  Name of family member notified:        PHYSICIAN       Additional Comment:    _______________________________________________ Estiven Kohan, Darleen Crocker, LCSW 12/05/2017,  4:32 PM

## 2017-12-05 NOTE — Clinical Social Work Note (Signed)
Clinical Social Work Assessment  Patient Details  Name: Ronald Huerta MRN: [id number] Date of Birth: 1934/03/31  Date of referral:  12/05/17               Reason for consult:  Facility Placement                Permission sought to share information with:  Oceanographer granted to share information::  Yes, Verbal Permission Granted  Name::      Skilled Nursing Facility   Agency::   [facility]   Relationship::     Contact Information:     Housing/Transportation Living arrangements for the past 2 months:  Single Family Home Source of Information:  Adult Children Patient Interpreter Needed:  None Criminal Activity/Legal Involvement Pertinent to Current Situation/Hospitalization:  No - Comment as needed Significant Relationships:  Adult Children Lives with:  Adult Children Do you feel safe going back to the place where you live?  Yes Need for family participation in patient care:  Yes (Comment)  Care giving concerns:  Patient lives in [place] with his son [place].    Social Worker assessment / plan:  Visual merchandiser (CSW) received SNF consult. PT is recommending SNF. Patient uses sign language and is not alert and oriented per chart. CSW contacted patient's son Doristine Church to complete assessment. Per Doristine Church patient has 3 adult children himself, his brother [place] and sister Delice Bison. Per Bert [place] and Delice Bison also use sign language. Per Doristine Church patient lives with his son [place] and [place] is his HPOA. CSW explained SNF process and that [facility] will have to approve it. Doristine Church is agreeable to SNF search in [place]. FL2 complete and faxed out. CSW presented bed offers to patient's son Doristine Church and he chose [facility]. Per Inetta Fermo [facility] liaison she will start [facility] SNF authorization today.   PT is now recommending home health and patient's son Doristine Church is aware of above. RN case manager aware of above. CSW will continue to follow and assist as needed.    Employment status:   Retired Database administrator PT Recommendations:  Skilled Nursing Facility Information / Referral to community resources:  Skilled Nursing Facility  Patient/Family's Response to care:  Patient's son chose [facility] and was told this afternoon that PT is now recommending   Patient/Family's Understanding of and Emotional Response to Diagnosis, Current Treatment, and Prognosis:  Patient's son Doristine Church was very pleasant and thanked CSW for assistance.   Emotional Assessment Appearance:  Appears stated age Attitude/Demeanor/Rapport:  Unable to Assess Affect (typically observed):  Unable to Assess Orientation:  Oriented to Self, Oriented to Place, Oriented to  Time, Fluctuating Orientation (Suspected and/or reported Sundowners) Alcohol / Substance use:  Not Applicable Psych involvement (Current and /or in the community):  No (Comment)  Discharge Needs  Concerns to be addressed:  Discharge Planning Concerns Readmission within the last 30 days:  No Current discharge risk:  Dependent with Mobility Barriers to Discharge:  Continued Medical Work up   [employer], Darleen Crocker, LCSW 12/05/2017, 4:33 PM

## 2017-12-05 NOTE — Progress Notes (Signed)
   Subjective: 3 Days Post-Op Procedure(s) (LRB): CANNULATED HIP PINNING (Right) Communicated with patient via interpreter Patient states right hip pain is mild. Denies any confusion. Pain is improving.  Denies any CP, SOB, ABD pain.  Objective: Vital signs in last 24 hours: Temp:  [97.7 F (36.5 C)-98.9 F (37.2 C)] 97.7 F (36.5 C) (11/05 0819) Pulse Rate:  [71-101] 71 (11/05 0819) Resp:  [14-25] 18 (11/05 0819) BP: (109-146)/(48-80) 140/69 (11/05 0819) SpO2:  [93 %-99 %] 99 % (11/05 0819)  Intake/Output from previous day: 11/04 0701 - 11/05 0700 In: 1010.3 [P.O.:120; I.V.:840.9; IV Piggyback:49.5] Out: 2210 [Urine:2210] Intake/Output this shift: No intake/output data recorded.  Recent Labs    12/03/17 0438 12/04/17 0349  HGB 10.4* 9.5*   Recent Labs    12/03/17 0438 12/04/17 0349  WBC 17.5* 8.8  RBC 3.13* 2.85*  HCT 30.9* 28.8*  PLT 135* 121*   Recent Labs    12/04/17 0349 12/05/17 0332  NA 140 142  K 4.2 3.6  CL 104 105  CO2 28 33*  BUN 18 13  CREATININE 0.78 0.71  GLUCOSE 92 93  CALCIUM 8.4* 8.0*   No results for input(s): LABPT, INR in the last 72 hours.  EXAM General - Patient is Alert and Appropriate Patient slightly confused. Thought he was in [place].  Extremity - Sensation intact distally Intact pulses distally Dorsiflexion/Plantar flexion intact No cellulitis present Compartment soft  Calf soft. Dressing - dressing C/D/I and no drainage Motor Function - intact, moving foot and toes well on exam.   Past Medical History:  Diagnosis Date  . Anemia   . Anxiety   . BPH with obstruction/lower urinary tract symptoms   . Calculus, kidney   . Cellulitis of calf right  . Deaf   . Elevated PSA   . GERD (gastroesophageal reflux disease)   . History of kidney stones   . HLD (hyperlipidemia)   . IBS (irritable bowel syndrome)   . Restless leg syndrome     Assessment/Plan:   3 Days Post-Op Procedure(s) (LRB): CANNULATED HIP PINNING  (Right) Active Problems:   Closed right hip fracture (HCC)  Estimated body mass index is 23.13 kg/m as calculated from the following:   Height as of this encounter: 5\' 8"  (1.727 m).   Weight as of this encounter: 69 kg. Advance diet  Continue with PT today.  Pain well controlled without narcotics CM to assist with discharge  Remove staples and apply Steri-Strips on 12/16/2017 Follow-up with [facility] orthopedics in 6 weeks for x-rays of the right hip TED hose bilateral lower extremities x6 weeks Aspirin daily x30 days for DVT prophylaxis  DVT Prophylaxis - Aspirin, Lovenox and TED hose Weight-Bearing as tolerated to right leg   T. Cranston Neighbor, PA-C [facility] 12/05/2017, 9:20 AM

## 2017-12-05 NOTE — Progress Notes (Signed)
Physical Therapy Treatment Patient Details Name: Ronald Huerta MRN: [id number] DOB: [birth year] Today's Date: 12/05/2017    History of Present Illness Patient is an 83 year old male admitted s/p R anterior THA.  On 11/3 pt with confusion/AMS and transferred to the ICU.  Pt received narcan with no change in neuro status.  Pt placed on BiPAP due to hypercapnic respiratory failure.  CT negative for acute intracranial abnormalities.  Received new PT orders on 11/4 and per verbal from Dr. Elisabeth Pigeon, PT to work with pt as long as he is alert and not on the BiPAP.  PMH includes (deafness requiring ASL interpreter), RLS, elevated PSA, HLD, anxiety and anemia.    PT Comments    Mr. Bernards continues to make good progress with mobility and completed his stair training this session.  Discussed techniques for safety in his home at d/c and suggested that pt have family members or friends check in on him when during the day when his son is at work.  Recommending a BSC for safety with toilet transfer at d/c.    Follow Up Recommendations  Home health PT     Equipment Recommendations  3in1 (PT)    Recommendations for Other Services       Precautions / Restrictions Precautions Precautions: Fall Restrictions Weight Bearing Restrictions: Yes RLE Weight Bearing: Weight bearing as tolerated    Mobility  Bed Mobility Overal bed mobility: Needs Assistance Bed Mobility: Supine to Sit     Supine to sit: HOB elevated;Supervision     General bed mobility comments: Pt performs with increased time and effort but no physical assist required.   Transfers Overall transfer level: Needs assistance Equipment used: Rolling walker (2 wheeled) Transfers: Sit to/from Stand Sit to Stand: Supervision         General transfer comment: Pt demonstrates safe and proper technique.  Pt with well controlled descent to sit.   Ambulation/Gait Ambulation/Gait assistance: Min guard Gait Distance (Feet): 120  Feet Assistive device: Rolling walker (2 wheeled) Gait Pattern/deviations: Decreased step length - right;Decreased step length - left;Trunk flexed Gait velocity: decreased   General Gait Details: Pt able to self correct flexed posture after 1 verbal cue at start of ambulation.  Pt steady with use of RW.  Limited by fatigue this session.    Stairs Stairs: Yes Stairs assistance: Min guard Stair Management: One rail Right;Step to pattern;Sideways Number of Stairs: 4 General stair comments: Demonstrated technique to pt and the pt demonstrated understanding back to this PT with min guard provided for safety.    Wheelchair Mobility    Modified Rankin (Stroke Patients Only)       Balance Overall balance assessment: Needs assistance Sitting-balance support: Feet supported;No upper extremity supported Sitting balance-Leahy Scale: Good Sitting balance - Comments: Able to sit EOB without UE support   Standing balance support: During functional activity;No upper extremity supported Standing balance-Leahy Scale: Fair Standing balance comment: Pt able to stand statically and sign without UE support but uses RW to ambulate                            Cognition Arousal/Alertness: Awake/alert Behavior During Therapy: WFL for tasks assessed/performed Overall Cognitive Status: Within Functional Limits for tasks assessed  Exercises      General Comments General comments (skin integrity, edema, etc.): Interpreter, Kim, using VRI utilized this session.       Pertinent Vitals/Pain Pain Assessment: Faces Faces Pain Scale: Hurts little more Pain Location: R hip Pain Descriptors / Indicators: Discomfort Pain Intervention(s): Limited activity within patient's tolerance;Monitored during session    Home Living                      Prior Function            PT Goals (current goals can now be found in the care  plan section) Acute Rehab PT Goals Patient Stated Goal: to improve mobility PT Goal Formulation: With patient Time For Goal Achievement: 12/17/17 Potential to Achieve Goals: Good Progress towards PT goals: Progressing toward goals    Frequency    BID      PT Plan Current plan remains appropriate    Co-evaluation              AM-PAC PT "6 Clicks" Daily Activity  Outcome Measure  Difficulty turning over in bed (including adjusting bedclothes, sheets and blankets)?: A Little Difficulty moving from lying on back to sitting on the side of the bed? : A Little Difficulty sitting down on and standing up from a chair with arms (e.g., wheelchair, bedside commode, etc,.)?: A Little Help needed moving to and from a bed to chair (including a wheelchair)?: A Little Help needed walking in hospital room?: A Little Help needed climbing 3-5 steps with a railing? : A Little 6 Click Score: 18    End of Session Equipment Utilized During Treatment: Gait belt Activity Tolerance: Patient tolerated treatment well Patient left: with call bell/phone within reach;in chair;with chair alarm set Nurse Communication: Mobility status PT Visit Diagnosis: Muscle weakness (generalized) (M62.81);Pain Pain - Right/Left: Right Pain - part of body: Hip     Time: 1301-1405(pt not charged for time spent on commode) PT Time Calculation (min) (ACUTE ONLY): 64 min  Charges:  $Gait Training: 23-37 mins $Therapeutic Activity: 8-22 mins                     [name] PT, DPT 12/05/2017, 4:03 PM

## 2017-12-05 NOTE — Care Management Important Message (Signed)
Important Message  Patient Details  Name: Ronald Huerta MRN: [id number] Date of Birth: 03/27/1934   Medicare Important Message Given:  Yes    Olegario Messier A Janiyha Montufar 12/05/2017, 10:36 AM

## 2017-12-06 ENCOUNTER — Inpatient Hospital Stay: Payer: Medicare Other

## 2017-12-06 LAB — BASIC METABOLIC PANEL
ANION GAP: 5 (ref 5–15)
BUN: 17 mg/dL (ref 8–23)
CALCIUM: 8.6 mg/dL — AB (ref 8.9–10.3)
CO2: 31 mmol/L (ref 22–32)
Chloride: 106 mmol/L (ref 98–111)
Creatinine, Ser: 0.64 mg/dL (ref 0.61–1.24)
GFR calc Af Amer: >60 mL/min (ref >60)
GLUCOSE: 102 mg/dL — AB (ref 70–99)
Potassium: 3.8 mmol/L (ref 3.5–5.1)
Sodium: 142 mmol/L (ref 135–145)

## 2017-12-06 LAB — ECHOCARDIOGRAM COMPLETE
HEIGHTINCHES: 68 in
WEIGHTICAEL: 2433.88 [oz_av]

## 2017-12-06 MED ORDER — ACETAMINOPHEN 325 MG PO TABS: 650.0000 mg | ORAL_TABLET | Freq: Four times a day (QID) | ORAL | 0 refills | Status: DC | PRN

## 2017-12-06 MED ORDER — TAMSULOSIN HCL 0.4 MG PO CAPS: 0.4000 mg | ORAL_CAPSULE | Freq: Every day | ORAL | 0 refills | Status: DC

## 2017-12-06 MED ORDER — TAMSULOSIN HCL 0.4 MG PO CAPS
0.4000 mg | ORAL_CAPSULE | Freq: Every day | ORAL | Status: DC
  Administered 2017-12-06: 0.4 mg via ORAL
  Filled 2017-12-06: qty 1

## 2017-12-06 MED ORDER — FINASTERIDE 5 MG PO TABS
5.0000 mg | ORAL_TABLET | Freq: Every day | ORAL | Status: DC
  Administered 2017-12-06: 5 mg via ORAL
  Filled 2017-12-06: qty 1

## 2017-12-06 MED ORDER — DOCUSATE SODIUM 100 MG PO CAPS: 100.0000 mg | ORAL_CAPSULE | Freq: Two times a day (BID) | ORAL | 0 refills | Status: DC

## 2017-12-06 MED ORDER — FINASTERIDE 5 MG PO TABS: 5.0000 mg | ORAL_TABLET | Freq: Every day | ORAL | 0 refills | Status: DC

## 2017-12-06 MED ORDER — ENOXAPARIN SODIUM 40 MG/0.4ML ~~LOC~~ SOLN: 40.0000 mg | SUBCUTANEOUS | 0 refills | Status: DC

## 2017-12-06 NOTE — Progress Notes (Signed)
   Subjective: 4 Days Post-Op Procedure(s) (LRB): CANNULATED HIP PINNING (Right) Patient asleep upon entering room.  Patient easily awoken. Patient making progress with PT.  PT recommending returning home with home health. Patient states right hip pain is mild. Denies any CP, SOB, ABD pain.  Objective: Vital signs in last 24 hours: Temp:  [97.7 F (36.5 C)-98.7 F (37.1 C)] 98.7 F (37.1 C) (11/06 0438) Pulse Rate:  [71-83] 83 (11/06 0438) Resp:  [18] 18 (11/06 0438) BP: (109-140)/(55-69) 109/55 (11/06 0438) SpO2:  [94 %-99 %] 94 % (11/06 0438)  Intake/Output from previous day: 11/05 0701 - 11/06 0700 In: 360 [P.O.:360] Out: 2175 [Urine:2175] Intake/Output this shift: No intake/output data recorded.  Recent Labs    12/04/17 0349  HGB 9.5*   Recent Labs    12/04/17 0349  WBC 8.8  RBC 2.85*  HCT 28.8*  PLT 121*   Recent Labs    12/05/17 0332 12/06/17 0345  NA 142 142  K 3.6 3.8  CL 105 106  CO2 33* 31  BUN 13 17  CREATININE 0.71 0.64  GLUCOSE 93 102*  CALCIUM 8.0* 8.6*   No results for input(s): LABPT, INR in the last 72 hours.  EXAM General - Patient is Alert and Appropriate  Extremity - Sensation intact distally Intact pulses distally Dorsiflexion/Plantar flexion intact No cellulitis present Compartment soft  Calf soft. Mild ecchymosis along the incision site. Dressing - dressing C/D/I and no drainage Motor Function - intact, moving foot and toes well on exam.   Past Medical History:  Diagnosis Date  . Anemia   . Anxiety   . BPH with obstruction/lower urinary tract symptoms   . Calculus, kidney   . Cellulitis of calf right  . Deaf   . Elevated PSA   . GERD (gastroesophageal reflux disease)   . History of kidney stones   . HLD (hyperlipidemia)   . IBS (irritable bowel syndrome)   . Restless leg syndrome     Assessment/Plan:   4 Days Post-Op Procedure(s) (LRB): CANNULATED HIP PINNING (Right) Active Problems:   Closed right hip  fracture (HCC)  Estimated body mass index is 23.13 kg/m as calculated from the following:   Height as of this encounter: 5\' 8"  (1.727 m).   Weight as of this encounter: 69 kg. Advance diet  Continue with PT today.  Pain well controlled without narcotics CM to assist with discharge  Remove staples and apply Steri-Strips on 12/16/2017 Follow-up with [facility] orthopedics in 6 weeks for x-rays of the right hip TED hose bilateral lower extremities x6 weeks Aspirin daily x30 days for DVT prophylaxis  DVT Prophylaxis - Aspirin, Lovenox and TED hose Weight-Bearing as tolerated to right leg   T. Cranston Neighbor, PA-C [facility] 12/06/2017, 7:40 AM

## 2017-12-06 NOTE — Progress Notes (Addendum)
Physical Therapy Treatment Patient Details Name: Ronald Huerta MRN: [id number] DOB: 05-18-1934 Today's Date: 12/06/2017    History of Present Illness Patient is an 83 year old male admitted s/p R anterior THA.  On 11/3 pt with confusion/AMS and transferred to the ICU.  Pt received narcan with no change in neuro status.  Pt placed on BiPAP due to hypercapnic respiratory failure.  CT negative for acute intracranial abnormalities.  Received new PT orders on 11/4 and per verbal from Dr. Elisabeth Pigeon, PT to work with pt as long as he is alert and not on the BiPAP.  PMH includes (deafness requiring ASL interpreter), RLS, elevated PSA, HLD, anxiety and anemia.   PT Comments    Ronald Huerta was limited by symptomatic +orthostatics this session in standing, Dr. Elisabeth Pigeon made aware following session.  BP taken in supine 107/59 at start of session, sitting EOB 106/65, standing 86/66 and pt reporting "feeling heavy and heart racing", sitting EOB 112/56, standing 87/53 asymptomatic, supine 116/60. Pt demonstrates safe technique with bed mobility and sit<>stand.  Pt's limitation (+orthostatics) is not a reflection of his mobility status but rather his medical status, and thus follow up recommendations to remain HHPT with updated requirement of supervision for OOB activity for pt's safety.  Anticipate once BP has improved, pt's mobility status will reflect mobility status yesterday (11/5).     Follow Up Recommendations  Home health PT;Supervision for mobility/OOB     Equipment Recommendations  3in1 (PT)    Recommendations for Other Services       Precautions / Restrictions Precautions Precautions: Fall;Other (comment) Precaution Comments: +orthostatics Restrictions Weight Bearing Restrictions: Yes RLE Weight Bearing: Weight bearing as tolerated    Mobility  Bed Mobility Overal bed mobility: Needs Assistance Bed Mobility: Supine to Sit;Sit to Supine     Supine to sit: HOB  elevated;Supervision Sit to supine: Supervision   General bed mobility comments: No physical assist or cues needed. Increased time but pt performs safely.   Transfers Overall transfer level: Needs assistance Equipment used: Rolling walker (2 wheeled) Transfers: Sit to/from Stand Sit to Stand: Min guard         General transfer comment: Min guard only due to +orthostatic this session.  Pt demonstrates safe and proper technique.  After second +orthostatic pt instructed in returning to supine.   Ambulation/Gait             General Gait Details: Unable to attempt this session due to +orthostatics   Stairs             Wheelchair Mobility    Modified Rankin (Stroke Patients Only)       Balance Overall balance assessment: Needs assistance Sitting-balance support: Feet supported;No upper extremity supported Sitting balance-Leahy Scale: Good Sitting balance - Comments: Able to sit EOB without UE support   Standing balance support: During functional activity;No upper extremity supported Standing balance-Leahy Scale: Poor Standing balance comment: Pt keeps 1 hand on RW and signs with other hand due to symptomatic orthostatics in standing this session                            Cognition Arousal/Alertness: Awake/alert Behavior During Therapy: WFL for tasks assessed/performed Overall Cognitive Status: Within Functional Limits for tasks assessed  Exercises Total Joint Exercises Straight Leg Raises: Right;10 reps;Supine    General Comments General comments (skin integrity, edema, etc.): Interpreter, Ronald Huerta, using VRI utlized this session.  Pt with +orthostatics this session.  BP taken in supine 107/59 at start of session, sitting EOB 106/65, standing 86/66 and pt reporting "feeling heavy and heart racing", sitting EOB 112/56, standing 87/53 asymptomatic, supine 116/60.       Pertinent Vitals/Pain  Pain Assessment: Faces Faces Pain Scale: Hurts little more Pain Location: R hip Pain Descriptors / Indicators: Discomfort Pain Intervention(s): Limited activity within patient's tolerance;Monitored during session    Home Living                      Prior Function            PT Goals (current goals can now be found in the care plan section) Acute Rehab PT Goals Patient Stated Goal: to improve mobility PT Goal Formulation: With patient Time For Goal Achievement: 12/17/17 Potential to Achieve Goals: Good Progress towards PT goals: Not progressing toward goals - comment(+orthostatics)    Frequency    BID      PT Plan Current plan remains appropriate(supervision/assist amount updated)    Co-evaluation              AM-PAC PT "6 Clicks" Daily Activity  Outcome Measure  Difficulty turning over in bed (including adjusting bedclothes, sheets and blankets)?: A Little Difficulty moving from lying on back to sitting on the side of the bed? : A Little Difficulty sitting down on and standing up from a chair with arms (e.g., wheelchair, bedside commode, etc,.)?: A Little Help needed moving to and from a bed to chair (including a wheelchair)?: A Little Help needed walking in hospital room?: A Little Help needed climbing 3-5 steps with a railing? : A Little 6 Click Score: 18    End of Session Equipment Utilized During Treatment: Gait belt Activity Tolerance: Treatment limited secondary to medical complications (Comment)(+orthostatics) Patient left: with call bell/phone within reach;in bed;with bed alarm set Nurse Communication: Mobility status;Other (comment)(+orthostatics) PT Visit Diagnosis: Muscle weakness (generalized) (M62.81);Pain Pain - Right/Left: Right Pain - part of body: Hip     Time: 1610-9604 PT Time Calculation (min) (ACUTE ONLY): 43 min  Charges:  $Therapeutic Activity: 23-37 mins                     [name] PT, DPT 12/06/2017, 12:25  PM
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
## 2017-12-06 NOTE — Discharge Planning (Signed)
Patient IV removed.  RN assessment and VS revealed stability for DC to home with [facility] (RN and PT). Discharge papers given, explained and educated. Informed of sugegsted FU appts and appts made.  Discussed self care of post-op sites and to contact urology if contines to have difficulties urinating.  Scripts called into [facility] ([place], [place]).  Home Walker order placed , per patient request (Face sheet and order faxed to [facility] outpatient store.) All discharge discussions held w/ son and translator box in room. Once ready, will be wheeled to front and son transporting home via car.
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
## 2017-12-06 NOTE — Care Management (Signed)
Case Manager spoke with [name], [facility] representative. [facility] will be able to accept Ronald Huerta as a client. Friend will transport [name] RN MSN CCM Care Management [phone]

## 2017-12-07 NOTE — Progress Notes (Signed)
12/07/17: Per [facility] liaison they can accept patient from home because [facility] approved SNF. Clinical Child psychotherapist (CSW) arranged for [facility] to transport patient from his home [address] [place], [place] to [facility]. Patient's son [place] is aware of above.   [employer], LCSW [phone]

## 2017-12-07 NOTE — Progress Notes (Signed)
12/07/17: Per [facility] liaison she received a call from patient's daughter Delice Bison asking if patient can go to [facility] from home. Patient discharged home yesterday 12/06/17. Per Inetta Fermo she is going to call [facility] to see if she can bring patient in from home and follow up with the family. Clinical Child psychotherapist (CSW) sent D/C summary and FL2 to [facility] today via H[facility].   [employer], LCSW [phone]

## 2017-12-12 ENCOUNTER — Encounter: Payer: Self-pay | Admitting: Urology

## 2017-12-12 ENCOUNTER — Ambulatory Visit: Payer: Medicare Other | Admitting: Urology

## 2017-12-15 ENCOUNTER — Ambulatory Visit: Payer: Medicare Other | Admitting: Urology

## 2018-02-06 ENCOUNTER — Encounter
Admission: RE | Admit: 2018-02-06 | Discharge: 2018-02-06 | Disposition: A | Discharge status: Home / Self Care | Payer: Medicare Other | Source: Ambulatory Visit | Attending: Orthopedic Surgery | Admitting: Orthopedic Surgery

## 2018-02-06 ENCOUNTER — Other Ambulatory Visit: Payer: Self-pay

## 2018-02-06 ENCOUNTER — Other Ambulatory Visit: Payer: Medicare Other

## 2018-02-06 DIAGNOSIS — Z01812 Encounter for preprocedural laboratory examination: Secondary | ICD-10-CM | POA: Insufficient documentation

## 2018-02-06 HISTORY — DX: Hypothyroidism, unspecified: E03.9

## 2018-02-06 LAB — URINALYSIS, ROUTINE W REFLEX MICROSCOPIC
BACTERIA UA: NONE SEEN
Bilirubin Urine: NEGATIVE
GLUCOSE, UA: NEGATIVE mg/dL
HGB URINE DIPSTICK: NEGATIVE
Ketones, ur: NEGATIVE mg/dL
LEUKOCYTES UA: NEGATIVE
Nitrite: NEGATIVE
Protein, ur: NEGATIVE mg/dL
Specific Gravity, Urine: 1.016 (ref 1.005–1.030)
pH: 6 (ref 5.0–8.0)

## 2018-02-06 LAB — CBC
HEMATOCRIT: 34.3 % — AB (ref 39.0–52.0)
HEMOGLOBIN: 11.2 g/dL — AB (ref 13.0–17.0)
MCH: 32.6 pg (ref 26.0–34.0)
MCHC: 32.7 g/dL (ref 30.0–36.0)
MCV: 99.7 fL (ref 80.0–100.0)
Platelets: 194 10*3/uL (ref 150–400)
RBC: 3.44 MIL/uL — ABNORMAL LOW (ref 4.22–5.81)
RDW: 14.6 % (ref 11.5–15.5)
WBC: 4.8 10*3/uL (ref 4.0–10.5)
nRBC: 0 % (ref 0.0–0.2)

## 2018-02-06 LAB — PROTIME-INR
INR: 1.05
Prothrombin Time: 13.6 seconds (ref 11.4–15.2)

## 2018-02-06 LAB — BASIC METABOLIC PANEL
Anion gap: 6 (ref 5–15)
BUN: 15 mg/dL (ref 8–23)
CALCIUM: 9.1 mg/dL (ref 8.9–10.3)
CO2: 29 mmol/L (ref 22–32)
CREATININE: 0.73 mg/dL (ref 0.61–1.24)
Chloride: 102 mmol/L (ref 98–111)
GFR calc Af Amer: >60 mL/min (ref >60)
GLUCOSE: 95 mg/dL (ref 70–99)
Potassium: 3.6 mmol/L (ref 3.5–5.1)
Sodium: 137 mmol/L (ref 135–145)

## 2018-02-06 LAB — APTT: aPTT: 34 seconds (ref 24–36)

## 2018-02-06 LAB — SEDIMENTATION RATE: SED RATE: 14 mm/h (ref 0–20)

## 2018-02-06 LAB — SURGICAL PCR SCREEN
MRSA, PCR: NEGATIVE
Staphylococcus aureus: POSITIVE — AB

## 2018-02-06 NOTE — Pre-Procedure Instructions (Signed)
UA/ POSITIVE STAPH FAXED TO [facility]

## 2018-02-06 NOTE — Pre-Procedure Instructions (Addendum)
FYI  12/02/17 hip pinning surgery pt was in ICU following surgery for elevated Co2 in lungs, was able to return to regular floor bed the next day.   From ED note 12/01/17  EKG  Reviewed and entered by me at 1915 Heart rate 79 QRS 100 QTc 470 Normal sinus rhythm, no evidence of acute ischemia.  Slight prolongation of the QT interval is noted

## 2018-02-06 NOTE — Patient Instructions (Signed)
Your procedure is scheduled on: Thursday 02/08/2018 Report to DAY SURGERY DEPARTMENT LOCATED ON 2ND FLOOR MEDICAL MALL ENTRANCE. To find out your arrival time please call [phone] between 1PM - 3PM on Wednesday 02/07/2018.  Remember: Instructions that are not followed completely may result in serious medical risk, up to and including death, or upon the discretion of your surgeon and anesthesiologist your surgery may need to be rescheduled.     _X__ 1. Do not eat food after midnight the night before your procedure.                 No gum chewing or hard candies. You may drink clear liquids up to 2 hours                 before you are scheduled to arrive for your surgery- DO not drink clear                 liquids within 2 hours of the start of your surgery.                 Clear Liquids include:  water, apple juice without pulp, clear carbohydrate                 drink such as Clearfast or Gatorade, Black Coffee or Tea (Do not add                 anything to coffee or tea).  __X__2.  On the morning of surgery brush your teeth with toothpaste and water, you                 may rinse your mouth with mouthwash if you wish.  Do not swallow any              toothpaste of mouthwash.     _X__ 3.  No Alcohol for 24 hours before or after surgery.   _X__ 4.  Do Not Smoke or use e-cigarettes For 24 Hours Prior to Your Surgery.                 Do not use any chewable tobacco products for at least 6 hours prior to                 surgery.  ____  5.  Bring all medications with you on the day of surgery if instructed.   __X__  6.  Notify your doctor if there is any change in your medical condition      (cold, fever, infections).     Do not wear jewelry, make-up, hairpins, clips or nail polish. Do not wear lotions, powders, or perfumes.  Do not shave 48 hours prior to surgery. Men may shave face and neck. Do not bring valuables to the hospital.    [facility] is not responsible for any belongings or  valuables.  Contacts, dentures/partials or body piercings may not be worn into surgery. Bring a case for your contacts, glasses or hearing aids, a denture cup will be supplied. Leave your suitcase in the car. After surgery it may be brought to your room. For patients admitted to the hospital, discharge time is determined by your treatment team.   Patients discharged the day of surgery will not be allowed to drive home.   Please read over the following fact sheets that you were given:   MRSA Information  __X__ Take these medicines the morning of surgery with A SIP OF WATER:  1. gabapentin (NEURONTIN)   2. levothyroxine (SYNTHROID, LEVOTHROID)  3. tamsulosin (FLOMAX)   4. finasteride (PROSCAR)  5.  6.  ____ Fleet Enema (as directed)   __X__ Use CHG Soap/SAGE wipes as directed  ____ Use inhalers on the day of surgery  ____ Stop metformin/Janumet/Farxiga 2 days prior to surgery    ____ Take 1/2 of usual insulin dose the night before surgery. No insulin the morning          of surgery.   ____ Stop Blood Thinners Coumadin/Plavix/Xarelto/Pleta/Pradaxa/Eliquis/Effient/Aspirin  on   Or contact your Surgeon, Cardiologist or Medical Doctor regarding  ability to stop your blood thinners  __X__ Stop Anti-inflammatories 7 days before surgery such as Advil, Ibuprofen, Motrin,  BC or Goodies Powder, Naprosyn, Naproxen, Aleve, Aspirin    __X__ Stop all herbal supplements, fish oil or vitamin E until after surgery.    ____ Bring C-Pap to the hospital.

## 2018-02-07 LAB — URINE CULTURE: CULTURE: NO GROWTH

## 2018-02-07 MED ORDER — CEFAZOLIN SODIUM-DEXTROSE 2-4 GM/100ML-% IV SOLN
2.0000 g | Freq: Once | INTRAVENOUS | Status: AC
  Administered 2018-02-08: 2 g via INTRAVENOUS

## 2018-02-07 MED ORDER — TRANEXAMIC ACID-NACL 1000-0.7 MG/100ML-% IV SOLN
1000.0000 mg | INTRAVENOUS | Status: AC
  Administered 2018-02-08: 1000 mg via INTRAVENOUS
  Filled 2018-02-07: qty 100

## 2018-02-08 ENCOUNTER — Inpatient Hospital Stay: Payer: Medicare Other

## 2018-02-08 ENCOUNTER — Other Ambulatory Visit: Payer: Self-pay

## 2018-02-08 ENCOUNTER — Encounter
Admission: RE | Disposition: A | Discharge status: Skilled Nursing Facility | Payer: Self-pay | Source: Home / Self Care | Attending: Orthopedic Surgery

## 2018-02-08 ENCOUNTER — Inpatient Hospital Stay
Admission: RE | Admit: 2018-02-08 | Discharge: 2018-02-12 | DRG: 470 | Disposition: A | Discharge status: Skilled Nursing Facility | Payer: Medicare Other | Attending: Orthopedic Surgery | Admitting: Orthopedic Surgery

## 2018-02-08 ENCOUNTER — Ambulatory Visit: Payer: Medicare Other | Admitting: Urology

## 2018-02-08 ENCOUNTER — Inpatient Hospital Stay: Payer: Medicare Other | Admitting: Anesthesiology

## 2018-02-08 DIAGNOSIS — G8918 Other acute postprocedural pain: Secondary | ICD-10-CM

## 2018-02-08 DIAGNOSIS — Z7982 Long term (current) use of aspirin: Secondary | ICD-10-CM

## 2018-02-08 DIAGNOSIS — M1611 Unilateral primary osteoarthritis, right hip: Secondary | ICD-10-CM | POA: Diagnosis present

## 2018-02-08 DIAGNOSIS — K219 Gastro-esophageal reflux disease without esophagitis: Secondary | ICD-10-CM | POA: Diagnosis present

## 2018-02-08 DIAGNOSIS — K589 Irritable bowel syndrome without diarrhea: Secondary | ICD-10-CM | POA: Diagnosis present

## 2018-02-08 DIAGNOSIS — F419 Anxiety disorder, unspecified: Secondary | ICD-10-CM | POA: Diagnosis present

## 2018-02-08 DIAGNOSIS — G2581 Restless legs syndrome: Secondary | ICD-10-CM | POA: Diagnosis present

## 2018-02-08 DIAGNOSIS — H905 Unspecified sensorineural hearing loss: Secondary | ICD-10-CM | POA: Diagnosis present

## 2018-02-08 DIAGNOSIS — Z79899 Other long term (current) drug therapy: Secondary | ICD-10-CM

## 2018-02-08 DIAGNOSIS — E785 Hyperlipidemia, unspecified: Secondary | ICD-10-CM | POA: Diagnosis present

## 2018-02-08 DIAGNOSIS — E039 Hypothyroidism, unspecified: Secondary | ICD-10-CM | POA: Diagnosis present

## 2018-02-08 DIAGNOSIS — Z419 Encounter for procedure for purposes other than remedying health state, unspecified: Secondary | ICD-10-CM

## 2018-02-08 DIAGNOSIS — Z96641 Presence of right artificial hip joint: Secondary | ICD-10-CM

## 2018-02-08 HISTORY — PX: TOTAL HIP ARTHROPLASTY: SHX124

## 2018-02-08 HISTORY — PX: HIP PINNING,CANNULATED: SHX1758

## 2018-02-08 LAB — CBC
HCT: 27 % — ABNORMAL LOW (ref 39.0–52.0)
Hemoglobin: 8.8 g/dL — ABNORMAL LOW (ref 13.0–17.0)
MCH: 32.5 pg (ref 26.0–34.0)
MCHC: 32.6 g/dL (ref 30.0–36.0)
MCV: 99.6 fL (ref 80.0–100.0)
Platelets: 144 10*3/uL — ABNORMAL LOW (ref 150–400)
RBC: 2.71 MIL/uL — ABNORMAL LOW (ref 4.22–5.81)
RDW: 14.5 % (ref 11.5–15.5)
WBC: 11.7 10*3/uL — ABNORMAL HIGH (ref 4.0–10.5)
nRBC: 0 % (ref 0.0–0.2)

## 2018-02-08 LAB — CREATININE, SERUM
Creatinine, Ser: 0.64 mg/dL (ref 0.61–1.24)
GFR calc Af Amer: >60 mL/min (ref >60)

## 2018-02-08 LAB — ABO/RH: ABO/RH(D): A NEG

## 2018-02-08 SURGERY — ARTHROPLASTY, HIP, TOTAL, ANTERIOR APPROACH
Anesthesia: Spinal | Site: Hip | Laterality: Right

## 2018-02-08 MED ORDER — ONDANSETRON HCL 4 MG/2ML IJ SOLN: 4.0000 mg | Freq: Once | INTRAMUSCULAR | Status: DC | PRN

## 2018-02-08 MED ORDER — METHOCARBAMOL 500 MG PO TABS
ORAL_TABLET | ORAL | Status: AC
  Filled 2018-02-08: qty 1

## 2018-02-08 MED ORDER — GENTAMICIN SULFATE 40 MG/ML IJ SOLN
INTRAMUSCULAR | Status: AC
  Filled 2018-02-08: qty 4

## 2018-02-08 MED ORDER — BUPIVACAINE HCL (PF) 0.5 % IJ SOLN
INTRAMUSCULAR | Status: DC | PRN
  Administered 2018-02-08: 3 mL via INTRATHECAL

## 2018-02-08 MED ORDER — ZOLPIDEM TARTRATE 5 MG PO TABS: 5.0000 mg | ORAL_TABLET | Freq: Every evening | ORAL | Status: DC | PRN

## 2018-02-08 MED ORDER — TERAZOSIN HCL 5 MG PO CAPS
5.0000 mg | ORAL_CAPSULE | Freq: Every day | ORAL | Status: DC
  Administered 2018-02-08 – 2018-02-10 (×2): 5 mg via ORAL
  Filled 2018-02-08 (×5): qty 1

## 2018-02-08 MED ORDER — GABAPENTIN 100 MG PO CAPS
100.0000 mg | ORAL_CAPSULE | Freq: Three times a day (TID) | ORAL | Status: DC
  Administered 2018-02-08 – 2018-02-12 (×9): 100 mg via ORAL
  Filled 2018-02-08 (×13): qty 1

## 2018-02-08 MED ORDER — PANTOPRAZOLE SODIUM 40 MG PO TBEC
40.0000 mg | DELAYED_RELEASE_TABLET | Freq: Every day | ORAL | Status: DC
  Administered 2018-02-08 – 2018-02-12 (×5): 40 mg via ORAL
  Filled 2018-02-08 (×5): qty 1

## 2018-02-08 MED ORDER — GLYCOPYRROLATE 0.2 MG/ML IJ SOLN
INTRAMUSCULAR | Status: AC
  Filled 2018-02-08: qty 1

## 2018-02-08 MED ORDER — SODIUM CHLORIDE 0.9 % IV SOLN
INTRAVENOUS | Status: DC
  Administered 2018-02-08: 1000 mL via INTRAVENOUS
  Administered 2018-02-09 (×2): via INTRAVENOUS

## 2018-02-08 MED ORDER — ALUM & MAG HYDROXIDE-SIMETH 200-200-20 MG/5ML PO SUSP: 30.0000 mL | ORAL | Status: DC | PRN

## 2018-02-08 MED ORDER — FINASTERIDE 5 MG PO TABS
5.0000 mg | ORAL_TABLET | Freq: Every day | ORAL | Status: DC
  Administered 2018-02-08 – 2018-02-12 (×5): 5 mg via ORAL
  Filled 2018-02-08 (×5): qty 1

## 2018-02-08 MED ORDER — NYSTATIN 100000 UNIT/GM EX CREA
1.0000 "application " | TOPICAL_CREAM | Freq: Two times a day (BID) | CUTANEOUS | Status: DC
  Administered 2018-02-08 – 2018-02-12 (×6): 1 via TOPICAL
  Filled 2018-02-08: qty 15

## 2018-02-08 MED ORDER — MENTHOL 3 MG MT LOZG
1.0000 | LOZENGE | OROMUCOSAL | Status: DC | PRN
  Administered 2018-02-10: 3 mg via ORAL
  Filled 2018-02-08 (×2): qty 9

## 2018-02-08 MED ORDER — HYDROCODONE-ACETAMINOPHEN 7.5-325 MG PO TABS
1.0000 | ORAL_TABLET | ORAL | Status: DC | PRN
  Administered 2018-02-09: 2 via ORAL
  Filled 2018-02-08 (×2): qty 2

## 2018-02-08 MED ORDER — PHENYLEPHRINE HCL 10 MG/ML IJ SOLN
INTRAMUSCULAR | Status: DC | PRN
  Administered 2018-02-08 (×4): 100 ug via INTRAVENOUS

## 2018-02-08 MED ORDER — SODIUM CHLORIDE 0.9 % IV SOLN
INTRAVENOUS | Status: DC | PRN
  Administered 2018-02-08: 30 ug/min via INTRAVENOUS

## 2018-02-08 MED ORDER — LEVOTHYROXINE SODIUM 25 MCG PO TABS
25.0000 ug | ORAL_TABLET | Freq: Every day | ORAL | Status: DC
  Administered 2018-02-09 – 2018-02-12 (×4): 25 ug via ORAL
  Filled 2018-02-08 (×5): qty 1

## 2018-02-08 MED ORDER — HYDROCODONE-ACETAMINOPHEN 5-325 MG PO TABS
1.0000 | ORAL_TABLET | ORAL | Status: DC | PRN
  Administered 2018-02-08 – 2018-02-09 (×2): 1 via ORAL
  Filled 2018-02-08 (×2): qty 1

## 2018-02-08 MED ORDER — PHENOL 1.4 % MT LIQD
1.0000 | OROMUCOSAL | Status: DC | PRN
  Filled 2018-02-08: qty 177

## 2018-02-08 MED ORDER — METHOCARBAMOL 500 MG PO TABS
500.0000 mg | ORAL_TABLET | Freq: Four times a day (QID) | ORAL | Status: DC | PRN
  Administered 2018-02-08: 500 mg via ORAL
  Filled 2018-02-08 (×3): qty 1

## 2018-02-08 MED ORDER — FENTANYL CITRATE (PF) 100 MCG/2ML IJ SOLN
INTRAMUSCULAR | Status: DC | PRN
  Administered 2018-02-08 (×2): 50 ug via INTRAVENOUS

## 2018-02-08 MED ORDER — FAMOTIDINE 20 MG PO TABS
ORAL_TABLET | ORAL | Status: AC
  Administered 2018-02-08: 20 mg via ORAL
  Filled 2018-02-08: qty 1

## 2018-02-08 MED ORDER — PANTOPRAZOLE SODIUM 40 MG PO TBEC: 40.0000 mg | DELAYED_RELEASE_TABLET | Freq: Every day | ORAL | Status: DC

## 2018-02-08 MED ORDER — FENTANYL CITRATE (PF) 100 MCG/2ML IJ SOLN
25.0000 ug | INTRAMUSCULAR | Status: DC | PRN
  Administered 2018-02-08: 50 ug via INTRAVENOUS

## 2018-02-08 MED ORDER — EPHEDRINE SULFATE 50 MG/ML IJ SOLN
INTRAMUSCULAR | Status: AC
  Filled 2018-02-08: qty 1

## 2018-02-08 MED ORDER — LIDOCAINE HCL (PF) 2 % IJ SOLN
INTRAMUSCULAR | Status: AC
  Filled 2018-02-08: qty 10

## 2018-02-08 MED ORDER — TRAMADOL HCL 50 MG PO TABS
50.0000 mg | ORAL_TABLET | Freq: Four times a day (QID) | ORAL | Status: DC
  Administered 2018-02-08 – 2018-02-12 (×7): 50 mg via ORAL
  Filled 2018-02-08 (×12): qty 1

## 2018-02-08 MED ORDER — BISACODYL 5 MG PO TBEC
5.0000 mg | DELAYED_RELEASE_TABLET | Freq: Every day | ORAL | Status: DC | PRN
  Administered 2018-02-11 – 2018-02-12 (×2): 5 mg via ORAL
  Filled 2018-02-08 (×2): qty 1

## 2018-02-08 MED ORDER — TRAMADOL HCL 50 MG PO TABS
ORAL_TABLET | ORAL | Status: AC
  Filled 2018-02-08: qty 1

## 2018-02-08 MED ORDER — LACTATED RINGERS IV SOLN
INTRAVENOUS | Status: DC
  Administered 2018-02-08 (×2): via INTRAVENOUS

## 2018-02-08 MED ORDER — ONDANSETRON HCL 4 MG PO TABS: 4.0000 mg | ORAL_TABLET | Freq: Four times a day (QID) | ORAL | Status: DC | PRN

## 2018-02-08 MED ORDER — CEFAZOLIN SODIUM-DEXTROSE 2-4 GM/100ML-% IV SOLN
INTRAVENOUS | Status: AC
  Filled 2018-02-08: qty 100

## 2018-02-08 MED ORDER — METHOCARBAMOL 1000 MG/10ML IJ SOLN
500.0000 mg | Freq: Four times a day (QID) | INTRAVENOUS | Status: DC | PRN
  Filled 2018-02-08: qty 5

## 2018-02-08 MED ORDER — BUPIVACAINE-EPINEPHRINE 0.25% -1:200000 IJ SOLN
INTRAMUSCULAR | Status: DC | PRN
  Administered 2018-02-08: 30 mL

## 2018-02-08 MED ORDER — CEFAZOLIN SODIUM-DEXTROSE 1-4 GM/50ML-% IV SOLN
INTRAVENOUS | Status: AC
  Filled 2018-02-08: qty 50

## 2018-02-08 MED ORDER — ACETAMINOPHEN 500 MG PO TABS
ORAL_TABLET | ORAL | Status: AC
  Filled 2018-02-08: qty 2

## 2018-02-08 MED ORDER — ACETAMINOPHEN 500 MG PO TABS
500.0000 mg | ORAL_TABLET | Freq: Four times a day (QID) | ORAL | Status: AC
  Administered 2018-02-08 – 2018-02-09 (×2): 500 mg via ORAL
  Filled 2018-02-08 (×2): qty 1

## 2018-02-08 MED ORDER — ACETAMINOPHEN 325 MG PO TABS
325.0000 mg | ORAL_TABLET | Freq: Four times a day (QID) | ORAL | Status: DC | PRN
  Administered 2018-02-10: 650 mg via ORAL
  Filled 2018-02-08: qty 2

## 2018-02-08 MED ORDER — BUPIVACAINE-EPINEPHRINE (PF) 0.25% -1:200000 IJ SOLN
INTRAMUSCULAR | Status: AC
  Filled 2018-02-08: qty 30

## 2018-02-08 MED ORDER — MORPHINE SULFATE (PF) 4 MG/ML IV SOLN
0.5000 mg | INTRAVENOUS | Status: DC | PRN
  Administered 2018-02-08 – 2018-02-10 (×2): 1 mg via INTRAVENOUS
  Filled 2018-02-08 (×2): qty 1

## 2018-02-08 MED ORDER — CEFAZOLIN SODIUM-DEXTROSE 1-4 GM/50ML-% IV SOLN
1.0000 g | Freq: Four times a day (QID) | INTRAVENOUS | Status: AC
  Administered 2018-02-08 – 2018-02-09 (×3): 1 g via INTRAVENOUS
  Filled 2018-02-08 (×4): qty 50

## 2018-02-08 MED ORDER — PROPOFOL 500 MG/50ML IV EMUL
INTRAVENOUS | Status: DC | PRN
  Administered 2018-02-08: 35 ug/kg/min via INTRAVENOUS

## 2018-02-08 MED ORDER — METOCLOPRAMIDE HCL 5 MG/ML IJ SOLN
5.0000 mg | Freq: Three times a day (TID) | INTRAMUSCULAR | Status: DC | PRN
  Administered 2018-02-12: 10 mg via INTRAVENOUS
  Filled 2018-02-08: qty 2

## 2018-02-08 MED ORDER — TAMSULOSIN HCL 0.4 MG PO CAPS
0.4000 mg | ORAL_CAPSULE | Freq: Every day | ORAL | Status: DC
  Administered 2018-02-08 – 2018-02-12 (×5): 0.4 mg via ORAL
  Filled 2018-02-08 (×5): qty 1

## 2018-02-08 MED ORDER — FAMOTIDINE 20 MG PO TABS
20.0000 mg | ORAL_TABLET | Freq: Once | ORAL | Status: AC
  Administered 2018-02-08: 20 mg via ORAL

## 2018-02-08 MED ORDER — SODIUM CHLORIDE 0.9 % IV SOLN
INTRAVENOUS | Status: DC | PRN
  Administered 2018-02-08: 500 mL

## 2018-02-08 MED ORDER — ENOXAPARIN SODIUM 40 MG/0.4ML ~~LOC~~ SOLN
40.0000 mg | SUBCUTANEOUS | Status: DC
  Administered 2018-02-09 – 2018-02-12 (×4): 40 mg via SUBCUTANEOUS
  Filled 2018-02-08 (×5): qty 0.4

## 2018-02-08 MED ORDER — GLYCOPYRROLATE 0.2 MG/ML IJ SOLN
INTRAMUSCULAR | Status: DC | PRN
  Administered 2018-02-08: 0.2 mg via INTRAVENOUS

## 2018-02-08 MED ORDER — DOCUSATE SODIUM 100 MG PO CAPS
100.0000 mg | ORAL_CAPSULE | Freq: Two times a day (BID) | ORAL | Status: DC
  Administered 2018-02-08 – 2018-02-12 (×9): 100 mg via ORAL
  Filled 2018-02-08 (×9): qty 1

## 2018-02-08 MED ORDER — FENTANYL CITRATE (PF) 100 MCG/2ML IJ SOLN
INTRAMUSCULAR | Status: AC
  Filled 2018-02-08: qty 2

## 2018-02-08 MED ORDER — PROPOFOL 10 MG/ML IV BOLUS
INTRAVENOUS | Status: DC | PRN
  Administered 2018-02-08: 20 mg via INTRAVENOUS

## 2018-02-08 MED ORDER — DIPHENHYDRAMINE HCL 12.5 MG/5ML PO ELIX: 12.5000 mg | ORAL_SOLUTION | ORAL | Status: DC | PRN

## 2018-02-08 MED ORDER — PROPOFOL 10 MG/ML IV BOLUS
INTRAVENOUS | Status: AC
  Filled 2018-02-08: qty 20

## 2018-02-08 MED ORDER — LIDOCAINE HCL (PF) 2 % IJ SOLN
INTRAMUSCULAR | Status: DC | PRN
  Administered 2018-02-08: 50 mg

## 2018-02-08 MED ORDER — EPHEDRINE SULFATE 50 MG/ML IJ SOLN
INTRAMUSCULAR | Status: DC | PRN
  Administered 2018-02-08 (×2): 10 mg via INTRAVENOUS

## 2018-02-08 MED ORDER — ADULT MULTIVITAMIN W/MINERALS CH
1.0000 | ORAL_TABLET | Freq: Every morning | ORAL | Status: DC
  Administered 2018-02-09 – 2018-02-12 (×4): 1 via ORAL
  Filled 2018-02-08 (×6): qty 1

## 2018-02-08 MED ORDER — ONDANSETRON HCL 4 MG/2ML IJ SOLN
4.0000 mg | Freq: Four times a day (QID) | INTRAMUSCULAR | Status: DC | PRN
  Administered 2018-02-08 – 2018-02-12 (×2): 4 mg via INTRAVENOUS
  Filled 2018-02-08 (×2): qty 2

## 2018-02-08 MED ORDER — METOCLOPRAMIDE HCL 10 MG PO TABS: 5.0000 mg | ORAL_TABLET | Freq: Three times a day (TID) | ORAL | Status: DC | PRN

## 2018-02-08 MED ORDER — MAGNESIUM HYDROXIDE 400 MG/5ML PO SUSP
30.0000 mL | Freq: Every day | ORAL | Status: DC | PRN
  Administered 2018-02-11 – 2018-02-12 (×2): 30 mL via ORAL
  Filled 2018-02-08 (×2): qty 30

## 2018-02-08 MED ORDER — MAGNESIUM CITRATE PO SOLN
1.0000 | Freq: Once | ORAL | Status: DC | PRN
  Filled 2018-02-08: qty 296

## 2018-02-08 SURGICAL SUPPLY — 72 items
BLADE SAGITTAL AGGR TOOTH XLG (BLADE) ×3 IMPLANT
BNDG COHESIVE 6X5 TAN STRL LF (GAUZE/BANDAGES/DRESSINGS) ×9 IMPLANT
CANISTER SUCT 1200ML W/VALVE (MISCELLANEOUS) ×3 IMPLANT
CHLORAPREP W/TINT 26ML (MISCELLANEOUS) ×3 IMPLANT
COVER WAND RF STERILE (DRAPES) ×3 IMPLANT
CUP ACETAB VERSA DBL 28X58 DMI (Orthopedic Implant) ×2 IMPLANT
DRAPE C-ARM XRAY 36X54 (DRAPES) ×3 IMPLANT
DRAPE INCISE IOBAN 66X60 STRL (DRAPES) IMPLANT
DRAPE POUCH INSTRU U-SHP 10X18 (DRAPES) ×3 IMPLANT
DRAPE SHEET LG 3/4 BI-LAMINATE (DRAPES) ×9 IMPLANT
DRAPE TABLE BACK 80X90 (DRAPES) ×3 IMPLANT
DRESSING SURGICEL FIBRLLR 1X2 (HEMOSTASIS) ×2 IMPLANT
DRSG OPSITE POSTOP 4X8 (GAUZE/BANDAGES/DRESSINGS) ×6 IMPLANT
DRSG SURGICEL FIBRILLAR 1X2 (HEMOSTASIS) ×6
DRSG TEGADERM 6X8 (GAUZE/BANDAGES/DRESSINGS) ×3 IMPLANT
ELECT BLADE 6.5 EXT (BLADE) ×3 IMPLANT
ELECT REM PT RETURN 9FT ADLT (ELECTROSURGICAL) ×3
ELECTRODE REM PT RTRN 9FT ADLT (ELECTROSURGICAL) ×1 IMPLANT
GAUZE PETRO XEROFOAM 1X8 (MISCELLANEOUS) ×3 IMPLANT
GAUZE SPONGE 4X4 12PLY STRL (GAUZE/BANDAGES/DRESSINGS) ×3 IMPLANT
GLOVE BIOGEL PI IND STRL 9 (GLOVE) ×1 IMPLANT
GLOVE BIOGEL PI INDICATOR 9 (GLOVE) ×2
GLOVE SURG SYN 9.0  PF PI (GLOVE) ×4
GLOVE SURG SYN 9.0 PF PI (GLOVE) ×2 IMPLANT
GOWN SRG 2XL LVL 4 RGLN SLV (GOWNS) ×1 IMPLANT
GOWN STRL NON-REIN 2XL LVL4 (GOWNS) ×2
GOWN STRL REUS W/ TWL LRG LVL3 (GOWN DISPOSABLE) ×1 IMPLANT
GOWN STRL REUS W/TWL LRG LVL3 (GOWN DISPOSABLE) ×2
HEAD FEMORAL SZ XXL 28MM (Head) ×2 IMPLANT
HEMOVAC 400CC 10FR (MISCELLANEOUS) IMPLANT
HOLDER FOLEY CATH W/STRAP (MISCELLANEOUS) ×3 IMPLANT
HOOD PEEL AWAY FLYTE STAYCOOL (MISCELLANEOUS) ×3 IMPLANT
KIT PREVENA INCISION MGT 13 (CANNISTER) ×3 IMPLANT
KIT TURNOVER KIT A (KITS) ×3 IMPLANT
MAT ABSORB  FLUID 56X50 GRAY (MISCELLANEOUS) ×2
MAT ABSORB FLUID 56X50 GRAY (MISCELLANEOUS) ×1 IMPLANT
NDL FILTER BLUNT 18X1 1/2 (NEEDLE) ×1 IMPLANT
NDL SAFETY ECLIPSE 18X1.5 (NEEDLE) ×1 IMPLANT
NDL SPNL 18GX3.5 QUINCKE PK (NEEDLE) ×1 IMPLANT
NEEDLE FILTER BLUNT 18X 1/2SAF (NEEDLE) ×2
NEEDLE FILTER BLUNT 18X1 1/2 (NEEDLE) ×1 IMPLANT
NEEDLE HYPO 18GX1.5 SHARP (NEEDLE) ×2
NEEDLE SPNL 18GX3.5 QUINCKE PK (NEEDLE) ×3 IMPLANT
NS IRRIG 1000ML POUR BTL (IV SOLUTION) ×3 IMPLANT
NS IRRIG 500ML POUR BTL (IV SOLUTION) ×3 IMPLANT
PACK HIP COMPR (MISCELLANEOUS) ×3 IMPLANT
PENCIL SMOKE ULTRAEVAC 22 CON (MISCELLANEOUS) ×3 IMPLANT
SCALPEL PROTECTED #10 DISP (BLADE) ×6 IMPLANT
SHELL ACETABULAR SZ0 58MM (Shell) ×2 IMPLANT
SOL PREP PVP 2OZ (MISCELLANEOUS) ×3
SOLUTION PREP PVP 2OZ (MISCELLANEOUS) ×1 IMPLANT
SPONGE DRAIN TRACH 4X4 STRL 2S (GAUZE/BANDAGES/DRESSINGS) ×3 IMPLANT
STAPLER SKIN PROX 35W (STAPLE) ×3 IMPLANT
STEM FEMORAL SZ9 STD COLLARED (Stem) ×2 IMPLANT
STRAP SAFETY 5IN WIDE (MISCELLANEOUS) ×3 IMPLANT
SUT DVC 2 QUILL PDO  T11 36X36 (SUTURE) ×2
SUT DVC 2 QUILL PDO T11 36X36 (SUTURE) ×1 IMPLANT
SUT PROLENE 2 0 FS (SUTURE) ×3 IMPLANT
SUT SILK 0 (SUTURE) ×2
SUT SILK 0 30XBRD TIE 6 (SUTURE) ×1 IMPLANT
SUT V-LOC 90 ABS DVC 3-0 CL (SUTURE) ×3 IMPLANT
SUT VIC AB 1 CT1 36 (SUTURE) ×3 IMPLANT
SUT VIC AB 2-0 SH 27 (SUTURE) ×2
SUT VIC AB 2-0 SH 27XBRD (SUTURE) ×1 IMPLANT
SYR 20CC LL (SYRINGE) ×3 IMPLANT
SYR 30ML LL (SYRINGE) ×3 IMPLANT
SYR 5ML LL (SYRINGE) ×3 IMPLANT
SYR BULB IRRIG 60ML STRL (SYRINGE) ×3 IMPLANT
TAPE MICROFOAM 4IN (TAPE) ×3 IMPLANT
TOWEL OR 17X26 4PK STRL BLUE (TOWEL DISPOSABLE) ×3 IMPLANT
TRAY FOLEY MTR SLVR 16FR STAT (SET/KITS/TRAYS/PACK) ×3 IMPLANT
WND VAC CANISTER 500ML (MISCELLANEOUS) ×3 IMPLANT

## 2018-02-08 NOTE — Anesthesia Post-op Follow-up Note (Signed)
Anesthesia QCDR form completed.        

## 2018-02-08 NOTE — [place] FL2 (Signed)
[place] MEDICAID FL2 LEVEL OF CARE SCREENING TOOL     IDENTIFICATION  Patient Name: Ronald Huerta Birthdate: Oct 26, 1934 Sex: male Admission Date (Current Location): 02/08/2018  [place] and [place] Number:  Chiropodist and Address:  [facility], [address], [place], [place] [address]      Provider Number: [id number]  Attending Physician Name and Address:  [name], MD  Relative Name and Phone Number:       Current Level of Care: Hospital Recommended Level of Care: Skilled Nursing Facility Prior Approval Number:    Date Approved/Denied:   PASRR Number: [id number])  Discharge Plan: SNF    Current Diagnoses: Patient Active Problem List   Diagnosis Date Noted  . Status post total hip replacement, right 02/08/2018  . Closed right hip fracture (HCC) 12/01/2017  . Cellulitis 04/11/2015  . Cellulitis of leg, left 04/03/2015  . Left leg cellulitis 04/03/2015  . BPH with obstruction/lower urinary tract symptoms 02/10/2015  . History of elevated PSA 02/10/2015  . Acquired hypothyroidism 04/01/2014  . Deaf, nonspeaking 04/01/2014  . RLS (restless legs syndrome) 04/01/2014    Orientation RESPIRATION BLADDER Height & Weight     Self, Time, Situation, Place  Normal Continent Weight: 140 lb (63.5 kg) Height:  5\' 8"  (172.7 cm)  BEHAVIORAL SYMPTOMS/MOOD NEUROLOGICAL BOWEL NUTRITION STATUS      Continent Diet(Diet: Regular )  AMBULATORY STATUS COMMUNICATION OF NEEDS Skin   Extensive Assist Uses Sign Language  Surgical wounds, Wound Vac(Incision: Right Hip, Provena Wound Vac. )                       Personal Care Assistance Level of Assistance  Bathing, Feeding, Dressing Bathing Assistance: Limited assistance Feeding assistance: Independent Dressing Assistance: Limited assistance     Functional Limitations Info  Sight, Hearing, Speech Sight Info: Adequate Hearing Info: Impaired Speech Info: Adequate    SPECIAL  CARE FACTORS FREQUENCY  PT (By licensed PT), OT (By licensed OT)     PT Frequency: (5) OT Frequency: (5)            Contractures      Additional Factors Info  Code Status, Allergies Code Status Info: (Full Code. ) Allergies Info: (No Known Allergies. )           Current Medications (02/08/2018):  This is the current hospital active medication list Current Facility-Administered Medications  Medication Dose Route Frequency Provider Last Rate Last Dose  . 0.9 %  sodium chloride infusion   Intravenous Continuous [name], MD 100 mL/hr at 02/08/18 1347 1,000 mL at 02/08/18 1347  . acetaminophen (TYLENOL) 500 MG tablet           . [START ON 02/09/2018] acetaminophen (TYLENOL) tablet 325-650 mg  325-650 mg Oral Q6H PRN [name], MD      . acetaminophen (TYLENOL) tablet 500 mg  500 mg Oral Q6H [name], MD   500 mg at 02/08/18 1353  . alum & mag hydroxide-simeth (MAALOX/MYLANTA) 200-200-20 MG/5ML suspension 30 mL  30 mL Oral Q4H PRN [name], MD      . bisacodyl (DULCOLAX) EC tablet 5 mg  5 mg Oral Daily PRN [name], MD      . ceFAZolin (ANCEF) IVPB 1 g/50 mL premix  1 g Intravenous Q6H [name], MD      . diphenhydrAMINE (BENADRYL) 12.5 MG/5ML elixir 12.5-25 mg  12.5-25 mg Oral Q4H PRN [name], MD      .  docusate sodium (COLACE) capsule 100 mg  100 mg Oral BID Kennedy BuckerMenz, Michael, MD   100 mg at 02/08/18 1338  . [START ON 02/09/2018] enoxaparin (LOVENOX) injection 40 mg  40 mg Subcutaneous Q24H Kennedy BuckerMenz, Michael, MD      . fentaNYL (SUBLIMAZE) 100 MCG/2ML injection           . finasteride (PROSCAR) tablet 5 mg  5 mg Oral Daily Kennedy BuckerMenz, Michael, MD   5 mg at 02/08/18 1333  . gabapentin (NEURONTIN) capsule 100 mg  100 mg Oral TID Kennedy BuckerMenz, Michael, MD      . HYDROcodone-acetaminophen [facility](NORCO) 7.5-325 MG per tablet 1-2 tablet  1-2 tablet Oral Q4H PRN Kennedy BuckerMenz, Michael, MD      . HYDROcodone-acetaminophen (NORCO/VICODIN) 5-325 MG per tablet 1-2 tablet  1-2 tablet Oral Q4H PRN  Kennedy BuckerMenz, Michael, MD      . Melene Muller[START ON 02/09/2018] levothyroxine (SYNTHROID, LEVOTHROID) tablet 25 mcg  25 mcg Oral Q0600 Kennedy BuckerMenz, Michael, MD      . magnesium citrate solution 1 Bottle  1 Bottle Oral Once PRN Kennedy BuckerMenz, Michael, MD      . magnesium hydroxide (MILK OF MAGNESIA) suspension 30 mL  30 mL Oral Daily PRN Kennedy BuckerMenz, Michael, MD      . menthol-cetylpyridinium (CEPACOL) lozenge 3 mg  1 lozenge Oral PRN Kennedy BuckerMenz, Michael, MD       Or  . phenol (CHLORASEPTIC) mouth spray 1 spray  1 spray Mouth/Throat PRN Kennedy BuckerMenz, Michael, MD      . methocarbamol (ROBAXIN) tablet 500 mg  500 mg Oral Q6H PRN Kennedy BuckerMenz, Michael, MD   500 mg at 02/08/18 1353   Or  . methocarbamol (ROBAXIN) 500 mg in dextrose 5 % 50 mL IVPB  500 mg Intravenous Q6H PRN Kennedy BuckerMenz, Michael, MD      . methocarbamol (ROBAXIN) 500 MG tablet           . metoCLOPramide (REGLAN) tablet 5-10 mg  5-10 mg Oral Q8H PRN Kennedy BuckerMenz, Michael, MD       Or  . metoCLOPramide (REGLAN) injection 5-10 mg  5-10 mg Intravenous Q8H PRN Kennedy BuckerMenz, Michael, MD      . morphine 4 MG/ML injection 0.52-1 mg  0.52-1 mg Intravenous Q2H PRN Kennedy BuckerMenz, Michael, MD      . multivitamin with minerals tablet 1 tablet  1 tablet Oral q morning - 10a Kennedy BuckerMenz, Michael, MD      . nystatin cream (MYCOSTATIN) 1 application  1 application Topical BID Kennedy BuckerMenz, Michael, MD   1 application at 02/08/18 1343  . ondansetron (ZOFRAN) tablet 4 mg  4 mg Oral Q6H PRN Kennedy BuckerMenz, Michael, MD       Or  . ondansetron [facility](ZOFRAN) injection 4 mg  4 mg Intravenous Q6H PRN Kennedy BuckerMenz, Michael, MD      . pantoprazole (PROTONIX) EC tablet 40 mg  40 mg Oral Daily Kennedy BuckerMenz, Michael, MD   40 mg at 02/08/18 1338  . tamsulosin (FLOMAX) capsule 0.4 mg  0.4 mg Oral Daily Kennedy BuckerMenz, Michael, MD   0.4 mg at 02/08/18 1333  . terazosin (HYTRIN) capsule 5 mg  5 mg Oral Daily Kennedy BuckerMenz, Michael, MD   5 mg at 02/08/18 1340  . traMADol (ULTRAM) 50 MG tablet           . traMADol (ULTRAM) tablet 50 mg  50 mg Oral Q6H Kennedy BuckerMenz, Michael, MD   50 mg at 02/08/18 1352  . zolpidem (AMBIEN) tablet 5  mg  5 mg Oral QHS PRN Kennedy BuckerMenz, Michael, MD  Discharge Medications: Please see discharge summary for a list of discharge medications.  Relevant Imaging Results:  Relevant Lab Results:   Additional Information (SSN: [id number])  Euan Wandler, Darleen CrockerBailey M, LCSW

## 2018-02-08 NOTE — Anesthesia Procedure Notes (Signed)
Spinal  Patient location during procedure: OR Staffing Anesthesiologist: Karenz, Andrew, MD Resident/CRNA: Aden Sek, CRNA Performed: resident/CRNA  Preanesthetic Checklist Completed: patient identified, site marked, surgical consent, pre-op evaluation, timeout performed, IV checked, risks and benefits discussed and monitors and equipment checked Spinal Block Patient position: sitting Prep: ChloraPrep and site prepped and draped Patient monitoring: heart rate, continuous pulse ox, blood pressure and cardiac monitor Approach: midline Location: L4-5 Injection technique: single-shot Needle Needle type: Introducer and Pencan  Needle gauge: 24 G Needle length: 9 cm Additional Notes Negative paresthesia. Negative blood return. Positive free-flowing CSF. Expiration date of kit checked and confirmed. Patient tolerated procedure well, without complications.       

## 2018-02-08 NOTE — Op Note (Signed)
02/08/2018  11:58 AM  PATIENT:  Ronald Huerta  84 y.o. 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 and osteoarthritis  PROCEDURE:  Procedure(s): TOTAL HIP ARTHROPLASTY ANTERIOR APPROACH (Right) CANNULATED SCREW REMOVAL FROM RIGHT HIP (Right) Conversion of prior hip surgery to total hip SURGEON: Leitha Schuller, MD  ASSISTANTS: None  ANESTHESIA:   spinal  EBL:  Total I/O In: 1200 [I.V.:1200] Out: 350 [Urine:50; Blood:300]  BLOOD ADMINISTERED:none  DRAINS: none   LOCAL MEDICATIONS USED:  MARCAINE     SPECIMEN:  Source of Specimen:  Right femoral head  DISPOSITION OF SPECIMEN:  PATHOLOGY  COUNTS:  YES  TOURNIQUET:  * No tourniquets in log *  IMPLANTS: Medacta AMIS 9 standard stem with 58 mm Mpact DM cup and liner with XXL metal 28 mm head  DICTATION: .Dragon Dictation   The patient was brought to the operating room and after spinal anesthesia was obtained patient was placed on the operative table with the ipsilateral foot into the Medacta attachment, contralateral leg on a well-padded table. C-arm was brought in and preop template x-ray taken. After prepping and draping in usual sterile fashion appropriate patient identification and timeout procedures were completed.  The prior lateral incision was opened and extended slightly proximally and the 4 screw heads could be palpated because the patient was slender.  A single guidewire was used to put the guidewire up into the screw and power screwdriver used to remove all 4 screws without any difficulty.  Anterior approach to the hip was obtained and centered over the greater trochanter and TFL muscle. The subcutaneous tissue was incised hemostasis being achieved by electrocautery. TFL fascia was incised and the muscle retracted laterally deep retractor placed. The lateral  femoral circumflex vessels were identified and ligated. The anterior capsule was exposed and a capsulotomy performed. The neck was identified and there was gross motion at the fracture site and a femoral neck cut carried out with a saw. The head was removed without difficulty and showed sclerotic femoral head and acetabulum. Reaming was carried out to 58 mm and a 58 mm cup trial gave appropriate tightness to the acetabular component a the ADM cup was impacted into position. The leg was then externally rotated and ischiofemoral and pubofemoral releases carried out. The femur was sequentially broached to a size 9, size 9 stem standard with XL head trials were placed and the final components chosen. The 9 standard stem was inserted along with a XXL metal 28 mm head and 58 mm liner. The hip was reduced and was stable the wound was thoroughly irrigated with fibrillar placed along the posterior capsule and medial neck. The deep fascia ws closed using a heavy Quill after infiltration of 30 cc of quarter percent Sensorcaine with epinephrine.3-0 V-loc to close the skin with skin staples.  The lateral incision was closed with 2-0 Vicryl and skin staples.  Incisional wound VAC over the anterior incision with a small honeycomb dressing over the lateral incision, patient was sent to recovery in stable condition.   PLAN OF CARE: Admit to inpatient
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
## 2018-02-08 NOTE — Progress Notes (Signed)
PT Cancellation Note  Patient Details Name: Ronald Huerta MRN: [id number] DOB: May 16, 1934   Cancelled Treatment:    Reason Eval/Treat Not Completed: Other (comment).  Received PT order but pt not yet transferred to the floor.  Will continue to follow acutely.   [name] PT, DPT 02/08/2018, 3:12 PM

## 2018-02-08 NOTE — OR Nursing (Signed)
Patient has been up drinking water, communicating with interpreter, she has been at bedside the whole time.  Patient was placed in holding, floor orders released, medications given and meal given to patient.  Patient talking to interpreter awaiting a bed.

## 2018-02-08 NOTE — Anesthesia Preprocedure Evaluation (Signed)
Anesthesia Evaluation  Patient identified by MRN, date of birth, ID band Patient awake  General Assessment Comment:Patient is deaf  Reviewed: Allergy & Precautions, NPO status , Patient's Chart, lab work & pertinent test results  History of Anesthesia Complications Negative for: history of anesthetic complications  Airway Mallampati: II       Dental  (+) Upper Dentures, Lower Dentures   Pulmonary neg pulmonary ROS,           Cardiovascular Exercise Tolerance: Good negative cardio ROS       Neuro/Psych neg Seizures Anxiety Patient is deaf  Neuromuscular disease    GI/Hepatic Neg liver ROS, GERD  Medicated,  Endo/Other  neg diabetesHypothyroidism   Renal/GU Renal disease (kidney stones)  negative genitourinary   Musculoskeletal   Abdominal Normal abdominal exam  (+)   Peds negative pediatric ROS (+)  Hematology  (+) Blood dyscrasia, anemia ,   Anesthesia Other Findings Past Medical History: No date: Anemia No date: Anxiety No date: BPH with obstruction/lower urinary tract symptoms No date: Calculus, kidney right: Cellulitis of calf No date: Deaf No date: Elevated PSA No date: GERD (gastroesophageal reflux disease) No date: History of kidney stones No date: HLD (hyperlipidemia) No date: IBS (irritable bowel syndrome) No date: Restless leg syndrome  Reproductive/Obstetrics                             Anesthesia Physical  Anesthesia Plan  ASA: II  Anesthesia Plan: Spinal   Post-op Pain Management:    Induction:   PONV Risk Score and Plan: 0 and Ondansetron, Dexamethasone and Treatment may vary due to age or medical condition  Airway Management Planned:   Additional Equipment:   Intra-op Plan:   Post-operative Plan:   Informed Consent: I have reviewed the patients History and Physical, chart, labs and discussed the procedure including the risks, benefits and  alternatives for the proposed anesthesia with the patient or authorized representative who has indicated his/her understanding and acceptance.     Plan Discussed with: CRNA  Anesthesia Plan Comments:         Anesthesia Quick Evaluation

## 2018-02-08 NOTE — H&P (Signed)
Reviewed paper H+P, will be scanned into chart. No changes noted.  

## 2018-02-08 NOTE — Transfer of Care (Signed)
Immediate Anesthesia Transfer of Care Note  Patient: [name]  Procedure(s) Performed: TOTAL HIP ARTHROPLASTY ANTERIOR APPROACH (Right Hip) CANNULATED SCREW REMOVAL FROM RIGHT HIP (Right Hip)  Patient Location: PACU  Anesthesia Type:Spinal  Level of Consciousness: awake, alert  and oriented  Airway & Oxygen Therapy: Patient Spontanous Breathing  Post-op Assessment: Report given to RN and Post -op Vital signs reviewed and stable  Post vital signs: Reviewed  Last Vitals:  Vitals Value Taken Time  BP 88/59 02/08/2018 11:36 AM  Temp    Pulse 98 02/08/2018 11:37 AM  Resp 20 02/08/2018 11:37 AM  SpO2 100 % 02/08/2018 11:37 AM  Vitals shown include unvalidated device data.  Last Pain:  Vitals:   02/08/18 0827  TempSrc: Temporal  PainSc: 10-Worst pain ever         Complications: No apparent anesthesia complications

## 2018-02-08 NOTE — Progress Notes (Signed)
Admission Note:  Interpreter at the bedside, navigator questions completed. Pt alert and oriented X4. Complaining of feeling nauseous, pt vomited X2, pt states " he feels better after that" and does not need medication. Surgical dressing clean dry and intact. Wound vac in place. Bed in lowest position call bell in reach bed alarm on.

## 2018-02-09 ENCOUNTER — Encounter: Payer: Self-pay | Admitting: Orthopedic Surgery

## 2018-02-09 LAB — BASIC METABOLIC PANEL
Anion gap: 3 — ABNORMAL LOW (ref 5–15)
BUN: 10 mg/dL (ref 8–23)
CO2: 28 mmol/L (ref 22–32)
Calcium: 8.1 mg/dL — ABNORMAL LOW (ref 8.9–10.3)
Chloride: 106 mmol/L (ref 98–111)
Creatinine, Ser: 0.68 mg/dL (ref 0.61–1.24)
GFR calc Af Amer: >60 mL/min (ref >60)
GFR calc non Af Amer: >60 mL/min (ref >60)
Glucose, Bld: 134 mg/dL — ABNORMAL HIGH (ref 70–99)
Potassium: 4 mmol/L (ref 3.5–5.1)
Sodium: 137 mmol/L (ref 135–145)

## 2018-02-09 LAB — CBC
HEMATOCRIT: 24.5 % — AB (ref 39.0–52.0)
Hemoglobin: 8.1 g/dL — ABNORMAL LOW (ref 13.0–17.0)
MCH: 33.1 pg (ref 26.0–34.0)
MCHC: 33.1 g/dL (ref 30.0–36.0)
MCV: 100 fL (ref 80.0–100.0)
Platelets: 127 10*3/uL — ABNORMAL LOW (ref 150–400)
RBC: 2.45 MIL/uL — ABNORMAL LOW (ref 4.22–5.81)
RDW: 14.5 % (ref 11.5–15.5)
WBC: 7 10*3/uL (ref 4.0–10.5)
nRBC: 0 % (ref 0.0–0.2)

## 2018-02-09 LAB — SURGICAL PATHOLOGY

## 2018-02-09 MED ORDER — FE FUMARATE-B12-VIT C-FA-IFC PO CAPS
1.0000 | ORAL_CAPSULE | Freq: Two times a day (BID) | ORAL | Status: DC
  Administered 2018-02-09 – 2018-02-12 (×7): 1 via ORAL
  Filled 2018-02-09 (×8): qty 1

## 2018-02-09 NOTE — Evaluation (Signed)
Physical Therapy Evaluation Patient Details Name: Ronald Huerta MRN: [id number] DOB: 01-13-1935 Today's Date: 02/09/2018   History of Present Illness  Pt is a 84 y/o Male who [facility] for a Right THA forllowing Right Hip Cannulated pinning failure. Pt's PMH includes deafness.    Clinical Impression  Pt is s/p THA resulting in the deficits listed below (see PT Problem List). Ronald Huerta requires mod assist for bed mobility and demonstrates LOB with sit>stand.  Cues provided for safety with transfers.  Ambulatory distance limited by pain and fatigue.  Given pt's current mobility status, recommending SNF at d/c.   Pt will benefit from skilled PT to increase their independence and safety with mobility to allow discharge to the venue listed below.     Follow Up Recommendations SNF    Equipment Recommendations  None recommended by PT    Recommendations for Other Services       Precautions / Restrictions Precautions Precautions: Fall Restrictions Weight Bearing Restrictions: Yes RLE Weight Bearing: Weight bearing as tolerated      Mobility  Bed Mobility Overal bed mobility: Needs Assistance Bed Mobility: Supine to Sit     Supine to sit: Mod assist;HOB elevated     General bed mobility comments: Assist to advance LEs to EOB.  Pt uses bed rail with HOB elevated.   Transfers Overall transfer level: Needs assistance Equipment used: Rolling walker (2 wheeled) Transfers: Sit to/from Stand Sit to Stand: Min guard         General transfer comment: Deferred  Ambulation/Gait Ambulation/Gait assistance: Min guard Gait Distance (Feet): 30 Feet Assistive device: Rolling walker (2 wheeled) Gait Pattern/deviations: Step-to pattern;Decreased stance time - right;Decreased weight shift to right;Decreased step length - left;Trunk flexed Gait velocity: decreased   General Gait Details: Cues for upright posture and forward gaze.    Stairs            Wheelchair  Mobility    Modified Rankin (Stroke Patients Only)       Balance Overall balance assessment: Needs assistance Sitting-balance support: No upper extremity supported;Feet supported Sitting balance-Leahy Scale: Good     Standing balance support: During functional activity;Bilateral upper extremity supported Standing balance-Leahy Scale: Poor Standing balance comment: Pt relies on RW for static and dynamic activities                             Pertinent Vitals/Pain Pain Assessment: 0-10 Pain Score: 5  Faces Pain Scale: Hurts even more Pain Location: Right Hip Pain Descriptors / Indicators: Aching Pain Intervention(s): Limited activity within patient's tolerance;Monitored during session    Home Living Family/patient expects to be discharged to:: Skilled nursing facility Living Arrangements: Children(Son) Available Help at Discharge: Family;Available PRN/intermittently Type of Home: House Home Access: Stairs to enter   Entrance Stairs-Number of Steps: 5 Home Layout: One level Home Equipment: Walker - 2 wheels;Bedside commode      Prior Function Level of Independence: Needs assistance      ADL's / Homemaking Assistance Needed: Modified indepndent with LE ADLS. Indpendent with light meal preparation. Stopped driving in Nov. [id number].  Comments: Pt ambulating with RW.  One fall in Nov in the past 6 months.  Pt ind with ADLs but does have son provide supervision for showering for safety.      Hand Dominance        Extremity/Trunk Assessment   Upper Extremity Assessment Upper Extremity Assessment: Overall WFL for tasks assessed  Lower Extremity Assessment Lower Extremity Assessment: RLE deficits/detail RLE Deficits / Details: Unable to formally assess s/p R THA.  Functionally, strength grossly at least 3/5    Cervical / Trunk Assessment Cervical / Trunk Assessment: Kyphotic  Communication   Communication: Deaf  Cognition Arousal/Alertness:  Awake/alert Behavior During Therapy: WFL for tasks assessed/performed Overall Cognitive Status: Within Functional Limits for tasks assessed                                        General Comments General comments (skin integrity, edema, etc.): In person interpreter utilized: Sales promotion account executiveBethany.     Exercises Total Joint Exercises Ankle Circles/Pumps: AROM;Both;10 reps;Supine Quad Sets: Strengthening;Both;10 reps;Supine Long Arc Quad: Both;10 reps;Seated;Strengthening   Assessment/Plan    PT Assessment Patient needs continued PT services  PT Problem List Decreased strength;Decreased range of motion;Decreased activity tolerance;Decreased balance;Decreased mobility;Pain;Decreased knowledge of use of DME;Decreased safety awareness       PT Treatment Interventions DME instruction;Gait training;Stair training;Functional mobility training;Therapeutic activities;Therapeutic exercise;Balance training;Neuromuscular re-education;Patient/family education;Modalities    PT Goals (Current goals can be found in the Care Plan section)  Acute Rehab PT Goals Patient Stated Goal: To return home PT Goal Formulation: With patient Time For Goal Achievement: 02/23/18 Potential to Achieve Goals: Good    Frequency BID   Barriers to discharge Inaccessible home environment;Decreased caregiver support Steps to enter home and alone during the day    Co-evaluation               AM-PAC PT "6 Clicks" Mobility  Outcome Measure Help needed turning from your back to your side while in a flat bed without using bedrails?: A Little Help needed moving from lying on your back to sitting on the side of a flat bed without using bedrails?: A Lot Help needed moving to and from a bed to a chair (including a wheelchair)?: A Little Help needed standing up from a chair using your arms (e.g., wheelchair or bedside chair)?: A Little Help needed to walk in hospital room?: A Little Help needed climbing 3-5 steps  with a railing? : A Lot 6 Click Score: 16    End of Session Equipment Utilized During Treatment: Gait belt Activity Tolerance: Patient tolerated treatment well Patient left: in chair;with call bell/phone within reach;with chair alarm set;with SCD's reapplied;with family/visitor present Nurse Communication: Mobility status PT Visit Diagnosis: Pain;Unsteadiness on feet (R26.81);Muscle weakness (generalized) (M62.81);Other abnormalities of gait and mobility (R26.89) Pain - Right/Left: Right Pain - part of body: Hip    Time: [id number] PT Time Calculation (min) (ACUTE ONLY): 41 min   Charges:   PT Evaluation $PT Eval Low Complexity: 1 Low PT Treatments $Gait Training: 8-22 mins $Therapeutic Exercise: 8-22 mins        Encarnacion ChuAshley Abashian PT, DPT 02/09/2018, 12:10 PM

## 2018-02-09 NOTE — Evaluation (Signed)
Occupational Therapy Evaluation Patient Details Name: Ronald CabalFranklin Delano Huerta MRN: [id number] DOB: [birth year] Today's Date: 02/09/2018    History of Present Illness Pt is a 84 y/o Male who [facility] for a Right THA forllowing Right Hip Cannulated pinning failure. Pt's PMH includes deafness.   Clinical Impression   Pt. Presents with weakness, limited activity tolerance, and limited functional mobility which limits the ability to complete basic ADL and IADL functioning. Pt. Has deafness, and requires sign language interpreter services. Pt. Resides at home with his son. Pt. Was independent with ADLs, and IADL functioning: including meal preparation, and medication management. Pt. Was able to drive up until Nov. 2019, and no longer drives. Pt.'s son is able to assist pt. As needed. Pt. Education was provided about A/E use for LE ADLs. Pt. Has a reacher, and sockaide at home and uses them consistently. Pt. Could benefit from a LH sponge. Pt. Could benefit from OT services for ADL training, A/E training, and pt. education about home modification, and DME. Pt. would benefitt from SNF level of care upon discharge. Pt. Could benefit from follow-up OT services at discharge.    Follow Up Recommendations  SNF   Equipment Recommendations       Recommendations for Other Services       Precautions / Restrictions Precautions Precautions: Fall Restrictions Weight Bearing Restrictions: Yes RLE Weight Bearing: Weight bearing as tolerated      Mobility   Balance                     ADL either performed or assessed with clinical judgement   ADL Overall ADL's : Needs assistance/impaired Eating/Feeding: Independent   Grooming: Independent   Upper Body Bathing: Independent   Lower Body Bathing: Maximal assistance   Upper Body Dressing : Independent   Lower Body Dressing: Maximal assistance                 General ADL Comments: Pt. education was provided about A/E use   for LE ADLs.     Vision Baseline Vision/History: Wears glasses Wears Glasses: Reading only Patient Visual Report: No change from baseline       Perception     Praxis      Pertinent Vitals/Pain Pain Assessment: 0-10 Pain Score: 5  Faces Pain Scale: Hurts even more Pain Location: Right Hip Pain Descriptors / Indicators: Aching Pain Intervention(s): Limited activity within patient's tolerance;Monitored during session     Hand Dominance     Extremity/Trunk Assessment Upper Extremity Assessment Upper Extremity Assessment: Overall WFL for tasks assessed      Communication Communication Communication: Deaf   Cognition Arousal/Alertness: Awake/alert Behavior During Therapy: WFL for tasks assessed/performed Overall Cognitive Status: Within Functional Limits for tasks assessed                                     General Comments  In person interpreter utilized: Bethany from [employer] interpreter services    Exercises     Shoulder Instructions      Home Living Family/patient expects to be discharged to:: Skilled nursing facility Living Arrangements: Children(Son) Available Help at Discharge: Family;Available PRN/intermittently Type of Home: House Home Access: Stairs to enter Entergy CorporationEntrance Stairs-Number of Steps: 5   Home Layout: One level     Bathroom Shower/Tub: Chief Strategy OfficerTub/shower unit   Bathroom Toilet: Standard Bathroom Accessibility: Yes   Home Equipment: Environmental consultantWalker - 2 wheels;Bedside  commode          Prior Functioning/Environment Level of Independence: Needs assistance    ADL's / Homemaking Assistance Needed: Modified indepndent with LE ADLS. Indpendent with light meal preparation. Stopped driving in Nov. 8003.   Comments: Pt ambulating with RW.  One fall in Nov in the past 6 months.  Pt ind with ADLs but does have son provide supervision for showering for safety.         OT Problem List: Decreased strength;Decreased knowledge of use of DME or  AE;Pain;Decreased range of motion      OT Treatment/Interventions: Self-care/ADL training;DME and/or AE instruction;Therapeutic activities;Patient/family education;Therapeutic exercise    OT Goals(Current goals can be found in the care plan section) Acute Rehab OT Goals Patient Stated Goal: To return home OT Goal Formulation: With patient Potential to Achieve Goals: Good  OT Frequency: Min 2X/week   Barriers to D/C:            Co-evaluation              AM-PAC OT "6 Clicks" Daily Activity     Outcome Measure Help from another person eating meals?: None Help from another person taking care of personal grooming?: None Help from another person toileting, which includes using toliet, bedpan, or urinal?: A Lot Help from another person bathing (including washing, rinsing, drying)?: A Lot Help from another person to put on and taking off regular upper body clothing?: None Help from another person to put on and taking off regular lower body clothing?: A Lot 6 Click Score: 18   End of Session    Activity Tolerance: Patient tolerated treatment well Patient left: in bed;with call bell/phone within reach;with bed alarm set  OT Visit Diagnosis: Muscle weakness (generalized) (M62.81)                Time: 1030-1053 OT Time Calculation (min): 23 min Charges:  OT General Charges $OT Visit: 1 Visit OT Evaluation $OT Eval Moderate Complexity: 1 Mod  Olegario Messier, MS, OTR/L  Olegario Messier 02/09/2018, 12:07 PM

## 2018-02-09 NOTE — Progress Notes (Signed)
Physical Therapy Treatment Patient Details Name: Ronald Huerta MRN: [id number] DOB: Aug 22, 1934 Today's Date: 02/09/2018    History of Present Illness Pt is a 84 y/o M s/p R THA.  Pt's PMH includes deafness.    PT Comments    Ronald Huerta reported 5/10 pain at rest and 10/10 pain with mobility, specifically while ambulating.  He demonstrated improved stability with sit>stand with cues provided for technique.  Cues for improved posture while ambulating. Follow up recommendations remain appropriate.    Follow Up Recommendations  SNF     Equipment Recommendations  None recommended by PT    Recommendations for Other Services       Precautions / Restrictions Precautions Precautions: Fall Restrictions Weight Bearing Restrictions: Yes RLE Weight Bearing: Weight bearing as tolerated    Mobility  Bed Mobility Overal bed mobility: Needs Assistance Bed Mobility: Sit to Supine     Supine to sit: Mod assist;HOB elevated Sit to supine: Mod assist   General bed mobility comments: Assist to bring LEs into bed. Pt able to scoot to [facility] with use of bed rails.  Transfers Overall transfer level: Needs assistance Equipment used: Rolling walker (2 wheeled) Transfers: Sit to/from Stand Sit to Stand: Min guard         General transfer comment: Cues to scoot to edge of seat prior to standing.  Improved stability this session with sit>stand.   Ambulation/Gait Ambulation/Gait assistance: Min guard Gait Distance (Feet): 45 Feet Assistive device: Rolling walker (2 wheeled) Gait Pattern/deviations: Step-to pattern;Decreased stance time - right;Decreased weight shift to right;Decreased step length - left;Trunk flexed Gait velocity: decreased Gait velocity interpretation: <1.31 ft/sec, indicative of household ambulator General Gait Details: Cues for upright posture and forward gaze.  Several standing rest breaks due to pain up to 10/10.    Stairs             Wheelchair  Mobility    Modified Rankin (Stroke Patients Only)       Balance Overall balance assessment: Needs assistance Sitting-balance support: No upper extremity supported;Feet supported Sitting balance-Leahy Scale: Good     Standing balance support: During functional activity;Bilateral upper extremity supported Standing balance-Leahy Scale: Poor Standing balance comment: Pt relies on RW for static and dynamic activities                            Cognition Arousal/Alertness: Awake/alert Behavior During Therapy: WFL for tasks assessed/performed Overall Cognitive Status: Within Functional Limits for tasks assessed                                        Exercises Total Joint Exercises Ankle Circles/Pumps: AROM;Both;10 reps;Supine Quad Sets: Strengthening;Both;10 reps;Supine Long Arc Quad: Both;10 reps;Seated;Strengthening    General Comments General comments (skin integrity, edema, etc.): Video interpreter utilized: Les      Pertinent Vitals/Pain Pain Assessment: 0-10 Pain Score: 10-Worst pain ever(5/10 at rest, 10/10 with mobility) Faces Pain Scale: Hurts even more Pain Location: R hip Pain Descriptors / Indicators: Grimacing;Guarding;Moaning Pain Intervention(s): Limited activity within patient's tolerance;Monitored during session;Repositioned;Utilized relaxation techniques    Home Living Family/patient expects to be discharged to:: Skilled nursing facility Living Arrangements: Children(Son) Available Help at Discharge: Family;Available PRN/intermittently Type of Home: House Home Access: Stairs to enter   Home Layout: One level Home Equipment: Environmental consultant - 2 wheels;Bedside commode      Prior  Function Level of Independence: Needs assistance    ADL's / Homemaking Assistance Needed: Modified indepndent with LE ADLS. Indpendent with light meal preparation. Stopped driving in Nov. 2800. Comments: Pt ambulating with RW.  One fall in Nov in the past 6  months.  Pt ind with ADLs but does have son provide supervision for showering for safety.    PT Goals (current goals can now be found in the care plan section) Acute Rehab PT Goals Patient Stated Goal: to be able to be as independent as possible PT Goal Formulation: With patient Time For Goal Achievement: 02/23/18 Potential to Achieve Goals: Good Progress towards PT goals: Progressing toward goals    Frequency    BID      PT Plan Current plan remains appropriate    Co-evaluation              AM-PAC PT "6 Clicks" Mobility   Outcome Measure  Help needed turning from your back to your side while in a flat bed without using bedrails?: A Little Help needed moving from lying on your back to sitting on the side of a flat bed without using bedrails?: A Lot Help needed moving to and from a bed to a chair (including a wheelchair)?: A Little Help needed standing up from a chair using your arms (e.g., wheelchair or bedside chair)?: A Little Help needed to walk in hospital room?: A Little Help needed climbing 3-5 steps with a railing? : A Lot 6 Click Score: 16    End of Session Equipment Utilized During Treatment: Gait belt Activity Tolerance: Patient limited by pain Patient left: with SCD's reapplied;in bed;with call bell/phone within reach;with bed alarm set Nurse Communication: Mobility status PT Visit Diagnosis: Pain;Unsteadiness on feet (R26.81);Muscle weakness (generalized) (M62.81);Other abnormalities of gait and mobility (R26.89) Pain - Right/Left: Right Pain - part of body: Hip     Time: 3491-7915 PT Time Calculation (min) (ACUTE ONLY): 41 min  Charges:  $Gait Training: 23-37 mins $Therapeutic Exercise: 8-22 mins $Therapeutic Activity: 8-22 mins                     [name] PT, DPT 02/09/2018, 2:51 PM

## 2018-02-09 NOTE — Clinical Social Work Note (Signed)
Clinical Social Work Assessment  Patient Details  Name: Ronald Huerta MRN: [id number] Date of Birth: 1934-06-26  Date of referral:  02/09/18               Reason for consult:  Facility Placement                Permission sought to share information with:  Oceanographeracility Contact Representative Permission granted to share information::  Yes, Verbal Permission Granted  Name::      Skilled Nursing Facility   Agency::   [place]   Relationship::     Contact Information:     Housing/Transportation Living arrangements for the past 2 months:  Single Family Home Source of Information:  Patient Patient Interpreter Needed:  Sign Language(Attempted to use sign language interpreter. ) Criminal Activity/Legal Involvement Pertinent to Current Situation/Hospitalization:  Yes Significant Relationships:  Adult Children Lives with:  Adult Children Do you feel safe going back to the place where you live?  Yes Need for family participation in patient care:  Yes (Comment)  Care giving concerns:  Patient lives in [place] with his adult son Ronald Huerta.    Social Worker assessment / plan:  Visual merchandiserClinical Social Worker (CSW) received SNF consult. PT is recommending SNF. CSW attempted to get a sign language interpreter however interpreter [facility]. CSW utilized the bedside nurse to communicate with patient. CSW introduced self and explained role of CSW department. Per patient he wants to go to [facility] for rehab and has been there before. [facility]. Patient reported his understanding. FL2 complete and faxed out.   CSW presented list of bed offers to patient and he chose [facility]. Per Inetta Fermoina [facility] liaison [facility]. CSW attempted to contact patient's son Ronald Huerta and Ronald Huerta however they did not answer and voicemails were left. CSW will continue to follow and assist as needed.   Employment status:  Disabled (Comment on whether or not currently  receiving Disability), Retired Database administratornsurance information:  Managed Medicare PT Recommendations:  Skilled Nursing Facility Information / Referral to community resources:  Skilled Nursing Facility  Patient/Family's Response to care:  Patient chose [facility].   Patient/Family's Understanding of and Emotional Response to Diagnosis, Current Treatment, and Prognosis:  Patient was very pleasant and thanked CSW for assistance.   Emotional Assessment Appearance:  Appears stated age Attitude/Demeanor/Rapport:    Affect (typically observed):  Accepting, Adaptable, Pleasant Orientation:  Oriented to Self, Oriented to Place, Oriented to  Time, Oriented to Situation Alcohol / Substance use:  Not Applicable Psych involvement (Current and /or in the community):  No (Comment)  Discharge Needs  Concerns to be addressed:  Discharge Planning Concerns Readmission within the last 30 days:  No Current discharge risk:  Dependent with Mobility Barriers to Discharge:  Continued Medical Work up   [employer], Darleen CrockerBailey M, LCSW 02/09/2018, 1:45 PM

## 2018-02-09 NOTE — Clinical Social Work Placement (Signed)
   CLINICAL SOCIAL WORK PLACEMENT  NOTE  Date:  02/09/2018  Patient Details  Name: Ronald Huerta MRN: [id number] Date of Birth: 11-Oct-1934  Clinical Social Work is seeking post-discharge placement for this patient at the Skilled  Nursing Facility level of care (*CSW will initial, date and re-position this form in  chart as items are completed):  Yes   Patient/family provided with [place] Clinical Social Work Department's list of facilities offering this level of care within the geographic area requested by the patient (or if unable, by the patient's family).  Yes   Patient/family informed of their freedom to choose among providers that offer the needed level of care, that participate in Medicare, Medicaid or managed care program needed by the patient, have an available bed and are willing to accept the patient.  Yes   Patient/family informed of [facility]'s ownership interest in [facility] and [facility], as well as of the fact that they are under no obligation to receive care at these facilities.  PASRR submitted to EDS on       PASRR number received on       Existing PASRR number confirmed on 02/08/18     FL2 transmitted to all facilities in geographic area requested by pt/family on 02/08/18     FL2 transmitted to all facilities within larger geographic area on       Patient informed that his/her managed care company has contracts with or will negotiate with certain facilities, including the following:        Yes   Patient/family informed of bed offers received.  Patient chooses bed at ([facility] )     Physician recommends and patient chooses bed at      Patient to be transferred to   on  .  Patient to be transferred to facility by       Patient family notified on   of transfer.  Name of family member notified:        PHYSICIAN       Additional Comment:    _______________________________________________ Jamar Casagrande, Darleen CrockerBailey M, LCSW 02/09/2018, 1:43  PM

## 2018-02-09 NOTE — Progress Notes (Signed)
   Subjective: 1 Day Post-Op Procedure(s) (LRB): TOTAL HIP ARTHROPLASTY ANTERIOR APPROACH (Right) CANNULATED SCREW REMOVAL FROM RIGHT HIP (Right) Patient reports pain as 5 on 0-10 scale.  Complains of burning along the anterior lateral thigh. Patient is well, and has had no acute complaints or problems Denies any CP, SOB, ABD pain. We will continue therapy today.   Objective: Vital signs in last 24 hours: Temp:  [96.2 F (35.7 C)-98.8 F (37.1 C)] 97.7 F (36.5 C) (01/10 0808) Pulse Rate:  [66-102] 84 (01/10 0808) Resp:  [17-25] 19 (01/10 0537) BP: (86-146)/(43-59) 102/43 (01/10 0808) SpO2:  [96 %-100 %] 99 % (01/10 0808) Weight:  [63.5 kg] 63.5 kg (01/09 0827)  Intake/Output from previous day: 01/09 0701 - 01/10 0700 In: 2200 [I.V.:2200] Out: 1310 [Urine:1010; Blood:300] Intake/Output this shift: No intake/output data recorded.  Recent Labs    02/06/18 0917 02/08/18 1734 02/09/18 0345  HGB 11.2* 8.8* 8.1*   Recent Labs    02/08/18 1734 02/09/18 0345  WBC 11.7* 7.0  RBC 2.71* 2.45*  HCT 27.0* 24.5*  PLT 144* 127*   Recent Labs    02/06/18 0953 02/08/18 1734 02/09/18 0345  NA 137  --  137  K 3.6  --  4.0  CL 102  --  106  CO2 29  --  28  BUN 15  --  10  CREATININE 0.73 0.64 0.68  GLUCOSE 95  --  134*  CALCIUM 9.1  --  8.1*   Recent Labs    02/06/18 0917  INR 1.05    EXAM General - Patient is Alert, Appropriate and Oriented Extremity - Neurovascular intact Sensation intact distally Intact pulses distally Dorsiflexion/Plantar flexion intact No cellulitis present Compartment soft Dressing - dressing C/D/I and no drainage, Praveena intact Motor Function - intact, moving foot and toes well on exam.   Past Medical History:  Diagnosis Date  . Anemia   . Anxiety   . BPH with obstruction/lower urinary tract symptoms   . Calculus, kidney   . Cellulitis of calf right  . Deaf   . Elevated PSA   . GERD (gastroesophageal reflux disease)   .  History of kidney stones   . HLD (hyperlipidemia)   . Hypothyroidism   . IBS (irritable bowel syndrome)   . Restless leg syndrome     Assessment/Plan:   1 Day Post-Op Procedure(s) (LRB): TOTAL HIP ARTHROPLASTY ANTERIOR APPROACH (Right) CANNULATED SCREW REMOVAL FROM RIGHT HIP (Right) Active Problems:   Status post total hip replacement, right  Estimated body mass index is 21.29 kg/m as calculated from the following:   Height as of this encounter: 5\' 8"  (1.727 m).   Weight as of this encounter: 63.5 kg. Advance diet Up with therapy  Needs bowel movement Recheck labs in the morning Acute post op blood loss anemia - Hgb 8.1.  Start iron supplementation Care management to assist with discharge   DVT Prophylaxis - Lovenox, TED hose and SCDs Weight-Bearing as tolerated to right leg   T. Cranston Neighborhris Gaines, PA-C [facility] 02/09/2018, 8:13 AM

## 2018-02-09 NOTE — Anesthesia Postprocedure Evaluation (Signed)
Anesthesia Post Note  Patient: Ronald Huerta  Procedure(s) Performed: TOTAL HIP ARTHROPLASTY ANTERIOR APPROACH (Right Hip) CANNULATED SCREW REMOVAL FROM RIGHT HIP (Right Hip)  Patient location during evaluation: Nursing Unit Anesthesia Type: Spinal Level of consciousness: oriented and awake and alert Pain management: pain level controlled Vital Signs Assessment: post-procedure vital signs reviewed and stable Respiratory status: spontaneous breathing and respiratory function stable Cardiovascular status: blood pressure returned to baseline and stable Postop Assessment: no headache, no backache, no apparent nausea or vomiting and patient able to bend at knees Anesthetic complications: no     Last Vitals:  Vitals:   02/09/18 0342 02/09/18 0537  BP: (!) 94/49 (!) 97/45  Pulse: 78 81  Resp: 19 19  Temp: 37 C 36.8 C  SpO2: 98% 97%    Last Pain:  Vitals:   02/09/18 0537  TempSrc: Oral  PainSc:                  Jules Schick

## 2018-02-10 LAB — BASIC METABOLIC PANEL
Anion gap: 4 — ABNORMAL LOW (ref 5–15)
BUN: 9 mg/dL (ref 8–23)
CO2: 29 mmol/L (ref 22–32)
CREATININE: 0.58 mg/dL — AB (ref 0.61–1.24)
Calcium: 8 mg/dL — ABNORMAL LOW (ref 8.9–10.3)
Chloride: 104 mmol/L (ref 98–111)
GFR calc Af Amer: >60 mL/min (ref >60)
GFR calc non Af Amer: >60 mL/min (ref >60)
Glucose, Bld: 113 mg/dL — ABNORMAL HIGH (ref 70–99)
Potassium: 4 mmol/L (ref 3.5–5.1)
SODIUM: 137 mmol/L (ref 135–145)

## 2018-02-10 LAB — HEMOGLOBIN AND HEMATOCRIT, BLOOD
HCT: 24.5 % — ABNORMAL LOW (ref 39.0–52.0)
HEMOGLOBIN: 8 g/dL — AB (ref 13.0–17.0)

## 2018-02-10 LAB — CBC
HCT: 23 % — ABNORMAL LOW (ref 39.0–52.0)
Hemoglobin: 7.4 g/dL — ABNORMAL LOW (ref 13.0–17.0)
MCH: 32.5 pg (ref 26.0–34.0)
MCHC: 32.2 g/dL (ref 30.0–36.0)
MCV: 100.9 fL — ABNORMAL HIGH (ref 80.0–100.0)
Platelets: 116 10*3/uL — ABNORMAL LOW (ref 150–400)
RBC: 2.28 MIL/uL — ABNORMAL LOW (ref 4.22–5.81)
RDW: 14.6 % (ref 11.5–15.5)
WBC: 7.1 10*3/uL (ref 4.0–10.5)
nRBC: 0 % (ref 0.0–0.2)

## 2018-02-10 LAB — PREPARE RBC (CROSSMATCH)

## 2018-02-10 MED ORDER — SODIUM CHLORIDE 0.9% IV SOLUTION: Freq: Once | INTRAVENOUS | Status: DC

## 2018-02-10 NOTE — Progress Notes (Signed)
   Subjective: 2 Days Post-Op Procedure(s) (LRB): TOTAL HIP ARTHROPLASTY ANTERIOR APPROACH (Right) CANNULATED SCREW REMOVAL FROM RIGHT HIP (Right) Patient reports pain as 5 on 0-10 scale. Pain only with standing and movement. Patient is well, and has had no acute complaints or problems Denies any CP, SOB, ABD pain. We will continue therapy today.   ASL interpreter on ipad used for HPI and consent for Blood transfusion  Objective: Vital signs in last 24 hours: Temp:  [98 F (36.7 C)-98.7 F (37.1 C)] 98 F (36.7 C) (01/11 0729) Pulse Rate:  [70-81] 79 (01/11 0729) Resp:  [18] 18 (01/10 1610) BP: (101-115)/(46-56) 114/56 (01/11 0729) SpO2:  [98 %-100 %] 98 % (01/11 0729)  Intake/Output from previous day: 01/10 0701 - 01/11 0700 In: 4277 [P.O.:600; I.V.:3677] Out: 465 [Urine:465] Intake/Output this shift: No intake/output data recorded.  Recent Labs    02/08/18 1734 02/09/18 0345 02/10/18 0407  HGB 8.8* 8.1* 7.4*   Recent Labs    02/09/18 0345 02/10/18 0407  WBC 7.0 7.1  RBC 2.45* 2.28*  HCT 24.5* 23.0*  PLT 127* 116*   Recent Labs    02/09/18 0345 02/10/18 0407  NA 137 137  K 4.0 4.0  CL 106 104  CO2 28 29  BUN 10 9  CREATININE 0.68 0.58*  GLUCOSE 134* 113*  CALCIUM 8.1* 8.0*   No results for input(s): LABPT, INR in the last 72 hours.  EXAM General - Patient is Alert, Appropriate and Oriented Extremity - Neurovascular intact Sensation intact distally Intact pulses distally Dorsiflexion/Plantar flexion intact No cellulitis present Compartment soft Dressing - dressing C/D/I and no drainage, Praveena intact. NO drainage Motor Function - intact, moving foot and toes well on exam.   Past Medical History:  Diagnosis Date  . Anemia   . Anxiety   . BPH with obstruction/lower urinary tract symptoms   . Calculus, kidney   . Cellulitis of calf right  . Deaf   . Elevated PSA   . GERD (gastroesophageal reflux disease)   . History of kidney stones   .  HLD (hyperlipidemia)   . Hypothyroidism   . IBS (irritable bowel syndrome)   . Restless leg syndrome     Assessment/Plan:   2 Days Post-Op Procedure(s) (LRB): TOTAL HIP ARTHROPLASTY ANTERIOR APPROACH (Right) CANNULATED SCREW REMOVAL FROM RIGHT HIP (Right) Active Problems:   Status post total hip replacement, right  Estimated body mass index is 21.29 kg/m as calculated from the following:   Height as of this encounter: 5\' 8"  (1.727 m).   Weight as of this encounter: 63.5 kg. Advance diet Up with therapy  Needs bowel movement Acute post op blood loss anemia - Hgb 7.4 trending down.  Transfuse 1 unit of PRBC. Recheck labs in the am Care management to assist with discharge to SNF   DVT Prophylaxis - Lovenox, TED hose and SCDs Weight-Bearing as tolerated to right leg   T. Cranston Neighbor, PA-C [facility] 02/10/2018, 8:13 AM

## 2018-02-10 NOTE — Progress Notes (Signed)
Physical Therapy Treatment Patient Details Name: Ronald Huerta MRN: [id number] DOB: 01/19/1935 Today's Date: 02/10/2018    History of Present Illness Pt is a 84 y/o M s/p R THA.  Pt's PMH includes deafness.    PT Comments    Pt is up to side of bed to stand and balance to use urinal, but declined gait this afternoon.  He is able to actively assist with RLE and did run through most of the total joint exercises with care to not stress the wound vac line.  Pt is able to communicate with signs to PT as well as written communication as needed.  Follow up acutely for more strengthening and balance training, then to transition to SNF for recovery of deficits.  His daughter is in for a visit, as well as a hearing friend and were all collectively able to communicate with signs and written communication.   Follow Up Recommendations  SNF     Equipment Recommendations  None recommended by PT    Recommendations for Other Services       Precautions / Restrictions Precautions Precautions: Fall Restrictions Weight Bearing Restrictions: Yes RLE Weight Bearing: Weight bearing as tolerated Other Position/Activity Restrictions: Watch HgB    Mobility  Bed Mobility Overal bed mobility: Needs Assistance Bed Mobility: Supine to Sit;Sit to Supine     Supine to sit: Mod assist;HOB elevated Sit to supine: Mod assist   General bed mobility comments: moved with PT assisting BLE's together, as pt instructed  Transfers Overall transfer level: Needs assistance Equipment used: Rolling walker (2 wheeled) Transfers: Sit to/from Stand Sit to Stand: Min assist         General transfer comment: pt was able to use UE's to assist   Ambulation/Gait             General Gait Details: declined based on fatigue(was just transfused)   Stairs             Wheelchair Mobility    Modified Rankin (Stroke Patients Only)       Balance Overall balance assessment: Needs  assistance Sitting-balance support: Feet supported Sitting balance-Leahy Scale: Good     Standing balance support: During functional activity;Bilateral upper extremity supported Standing balance-Leahy Scale: Poor Standing balance comment: required RW for support to stand                            Cognition Arousal/Alertness: Awake/alert Behavior During Therapy: WFL for tasks assessed/performed Overall Cognitive Status: Within Functional Limits for tasks assessed                                        Exercises Total Joint Exercises Ankle Circles/Pumps: AAROM;Both;5 reps Quad Sets: AAROM;AROM;Both;10 reps Heel Slides: AAROM;Right;5 reps Hip ABduction/ADduction: AAROM;Right;10 reps    General Comments        Pertinent Vitals/Pain Pain Assessment: 0-10 Pain Score: 10-Worst pain ever Pain Location: R hip Pain Descriptors / Indicators: Operative site guarding Pain Intervention(s): Limited activity within patient's tolerance;Monitored during session;Premedicated before session;Repositioned    Home Living                      Prior Function            PT Goals (current goals can now be found in the care plan section) Acute Rehab PT Goals Patient  Stated Goal: to be able to be as independent as possible Progress towards PT goals: Progressing toward goals    Frequency    BID      PT Plan Current plan remains appropriate    Co-evaluation              AM-PAC PT "6 Clicks" Mobility   Outcome Measure  Help needed turning from your back to your side while in a flat bed without using bedrails?: A Little Help needed moving from lying on your back to sitting on the side of a flat bed without using bedrails?: A Lot Help needed moving to and from a bed to a chair (including a wheelchair)?: A Lot Help needed standing up from a chair using your arms (e.g., wheelchair or bedside chair)?: A Lot Help needed to walk in hospital room?:  A Lot Help needed climbing 3-5 steps with a railing? : Total 6 Click Score: 12    End of Session Equipment Utilized During Treatment: Gait belt Activity Tolerance: Patient limited by fatigue Patient left: in bed;with call bell/phone within reach;with bed alarm set;with family/visitor present Nurse Communication: Mobility status PT Visit Diagnosis: Pain;Unsteadiness on feet (R26.81);Muscle weakness (generalized) (M62.81);Other abnormalities of gait and mobility (R26.89) Pain - Right/Left: Right Pain - part of body: Hip     Time: 2446-2863 PT Time Calculation (min) (ACUTE ONLY): 28 min  Charges:  $Therapeutic Exercise: 8-22 mins $Therapeutic Activity: 8-22 mins                    Ivar Drape 02/10/2018, 4:56 PM  Samul Dada, PT MS Acute Rehab Dept. Number: [facility] [id number] and [facility] [phone]

## 2018-02-10 NOTE — Plan of Care (Signed)
Discussed with patient plan of care for the evening, pain management and the fact he was warm to the touch with some teach back displayed.  Will check his temperature and lower room thermostat at this time.

## 2018-02-10 NOTE — Progress Notes (Signed)
Physical Therapy Treatment Patient Details Name: Ronald Huerta MRN: [id number] DOB: 1934/03/10 Today's Date: 02/10/2018    History of Present Illness Pt is a 84 y/o M s/p R THA.  Pt's PMH includes deafness.    PT Comments    Chart reviewed HgB 7.4.  Pt asymptomatic.  Discussed with RN and ok for supine exercises as below.  Pt awaiting blood transfusion.  Will continue with mobility skills as appropriate.  Interpreter in attendance for session.   Follow Up Recommendations  SNF     Equipment Recommendations  None recommended by PT    Recommendations for Other Services       Precautions / Restrictions Precautions Precautions: Fall Restrictions Weight Bearing Restrictions: Yes RLE Weight Bearing: Weight bearing as tolerated Other Position/Activity Restrictions: Watch HgB    Mobility  Bed Mobility                  Transfers                    Ambulation/Gait                 Stairs             Wheelchair Mobility    Modified Rankin (Stroke Patients Only)       Balance                                            Cognition Arousal/Alertness: Awake/alert Behavior During Therapy: WFL for tasks assessed/performed Overall Cognitive Status: Within Functional Limits for tasks assessed                                        Exercises Other Exercises Other Exercises: supine A/AAROM for ankle pumps, quad sets, ab/add, heel slides and SLR x 10    General Comments        Pertinent Vitals/Pain Pain Assessment: 0-10 Pain Score: 2  Pain Location: R hip Pain Descriptors / Indicators: Grimacing;Guarding Pain Intervention(s): Limited activity within patient's tolerance    Home Living                      Prior Function            PT Goals (current goals can now be found in the care plan section) Progress towards PT goals: Progressing toward goals    Frequency    BID      PT Plan Current plan remains appropriate    Co-evaluation              AM-PAC PT "6 Clicks" Mobility   Outcome Measure  Help needed turning from your back to your side while in a flat bed without using bedrails?: A Little Help needed moving from lying on your back to sitting on the side of a flat bed without using bedrails?: A Lot Help needed moving to and from a bed to a chair (including a wheelchair)?: A Little Help needed standing up from a chair using your arms (e.g., wheelchair or bedside chair)?: A Little Help needed to walk in hospital room?: A Little Help needed climbing 3-5 steps with a railing? : A Lot 6 Click Score: 16    End of Session   Activity Tolerance: Treatment limited secondary  to medical complications (Comment) Patient left: in bed;with call bell/phone within reach;with bed alarm set;with nursing/sitter in room;Other (comment)   Pain - Right/Left: Right Pain - part of body: Hip     Time: 3013-1438 PT Time Calculation (min) (ACUTE ONLY): 13 min  Charges:  $Therapeutic Exercise: 8-22 mins                     Danielle Dess, PTA 02/10/18, 11:14 AM

## 2018-02-10 NOTE — Plan of Care (Signed)
Discussed with patient and family through writing plan of care for the evening, pain management and making sure patient has a supper tray with some teach back displayed.

## 2018-02-11 LAB — BPAM RBC
Blood Product Expiration Date: 202001172359
ISSUE DATE / TIME: 202001111058
Unit Type and Rh: 600

## 2018-02-11 LAB — CBC
HCT: 23.6 % — ABNORMAL LOW (ref 39.0–52.0)
HCT: 23.2 % — ABNORMAL LOW (ref 39.0–52.0)
Hemoglobin: 7.8 g/dL — ABNORMAL LOW (ref 13.0–17.0)
Hemoglobin: 7.8 g/dL — ABNORMAL LOW (ref 13.0–17.0)
MCH: 31.5 pg (ref 26.0–34.0)
MCH: 32.1 pg (ref 26.0–34.0)
MCHC: 33.1 g/dL (ref 30.0–36.0)
MCHC: 33.6 g/dL (ref 30.0–36.0)
MCV: 95.2 fL (ref 80.0–100.0)
MCV: 95.5 fL (ref 80.0–100.0)
PLATELETS: 144 10*3/uL — AB (ref 150–400)
Platelets: 122 10*3/uL — ABNORMAL LOW (ref 150–400)
RBC: 2.48 MIL/uL — ABNORMAL LOW (ref 4.22–5.81)
RBC: 2.43 MIL/uL — ABNORMAL LOW (ref 4.22–5.81)
RDW: 16.9 % — ABNORMAL HIGH (ref 11.5–15.5)
RDW: 16.5 % — ABNORMAL HIGH (ref 11.5–15.5)
WBC: 6.1 10*3/uL (ref 4.0–10.5)
WBC: 5.4 10*3/uL (ref 4.0–10.5)
nRBC: 0 % (ref 0.0–0.2)
nRBC: 0 % (ref 0.0–0.2)

## 2018-02-11 LAB — TYPE AND SCREEN
ABO/RH(D): A NEG
ANTIBODY SCREEN: NEGATIVE
Unit division: 0

## 2018-02-11 NOTE — Plan of Care (Signed)
Discussed with patient plan of care for the evening, pain management and importance of trying to have a bowel movement tonight with some teach back displayed

## 2018-02-11 NOTE — Progress Notes (Addendum)
   Subjective: 3 Days Post-Op Procedure(s) (LRB): TOTAL HIP ARTHROPLASTY ANTERIOR APPROACH (Right) CANNULATED SCREW REMOVAL FROM RIGHT HIP (Right) Patient reports pain as mild. Overall pain improving.  Patient is well, and has had no acute complaints or problems Denies any CP, SOB, ABD pain. We will continue therapy today.   ASL interpreter on ipad used for HPI and consent for Blood transfusion  Objective: Vital signs in last 24 hours: Temp:  [98 F (36.7 C)-99.1 F (37.3 C)] 99.1 F (37.3 C) (01/11 2345) Pulse Rate:  [75-80] 75 (01/11 2345) Resp:  [18] 18 (01/11 2345) BP: (103-121)/(50-57) 112/51 (01/11 2345) SpO2:  [95 %-98 %] 95 % (01/11 2345)  Intake/Output from previous day: 01/11 0701 - 01/12 0700 In: 1060 [P.O.:720; Blood:340] Out: 1800 [Urine:1800] Intake/Output this shift: Total I/O In: 720 [P.O.:720] Out: 1300 [Urine:1300]  Recent Labs    02/08/18 1734 02/09/18 0345 02/10/18 0407 02/10/18 1457 02/11/18 0515  HGB 8.8* 8.1* 7.4* 8.0* 7.8*   Recent Labs    02/10/18 0407 02/10/18 1457 02/11/18 0515  WBC 7.1  --  6.1  RBC 2.28*  --  2.48*  HCT 23.0* 24.5* 23.6*  PLT 116*  --  122*   Recent Labs    02/09/18 0345 02/10/18 0407  NA 137 137  K 4.0 4.0  CL 106 104  CO2 28 29  BUN 10 9  CREATININE 0.68 0.58*  GLUCOSE 134* 113*  CALCIUM 8.1* 8.0*   No results for input(s): LABPT, INR in the last 72 hours.  EXAM General - Patient is Alert, Appropriate and Oriented Extremity - Neurovascular intact Sensation intact distally Intact pulses distally Dorsiflexion/Plantar flexion intact No cellulitis present Compartment soft Dressing - dressing C/D/I and no drainage, Praveena intact. NO drainage Motor Function - intact, moving foot and toes well on exam.   Past Medical History:  Diagnosis Date  . Anemia   . Anxiety   . BPH with obstruction/lower urinary tract symptoms   . Calculus, kidney   . Cellulitis of calf right  . Deaf   . Elevated PSA    . GERD (gastroesophageal reflux disease)   . History of kidney stones   . HLD (hyperlipidemia)   . Hypothyroidism   . IBS (irritable bowel syndrome)   . Restless leg syndrome     Assessment/Plan:   3 Days Post-Op Procedure(s) (LRB): TOTAL HIP ARTHROPLASTY ANTERIOR APPROACH (Right) CANNULATED SCREW REMOVAL FROM RIGHT HIP (Right) Active Problems:   Status post total hip replacement, right  Estimated body mass index is 21.29 kg/m as calculated from the following:   Height as of this encounter: 5\' 8"  (1.727 m).   Weight as of this encounter: 63.5 kg. Advance diet Up with therapy  Needs bowel movement Acute post op blood loss anemia - Hgb 7.8, down from 8.0 yesterday after 1 unit of packed red blood cells.  Recheck labs this afternoon and if hemoglobin dropping will give second unit of packed red blood cells.  Continue with iron supplementation. Care management to assist with discharge to SNF Monday   DVT Prophylaxis - Lovenox, TED hose and SCDs Weight-Bearing as tolerated to right leg   T. Cranston Neighbor, PA-C [facility] 02/11/2018, 6:59 AM

## 2018-02-11 NOTE — Progress Notes (Signed)
Physical Therapy Treatment Patient Details Name: Ronald Huerta MRN: [id number] DOB: 03-Feb-1934 Today's Date: 02/11/2018    History of Present Illness Pt is a 84 y/o M s/p R THA.  Pt's PMH includes deafness.     PT Comments    Patient was alert and oriented upon PT arrival to room. Utilization of video interpreter for session allowed patient optimal understanding and care. Patient's pain is minimal to none when non weightbearing (such as sitting and laying down) however increases to 10/10 per patient report upon movement and/or weightbearing. Progression in standing performed with decreasing UE support in static position for carryover to ADL performance such as brushing teeth and/or toileting. Patient tolerated standing marches with RW as well as short walk to nursing station with decreased weight acceptance onto RLE. Patient continues to benefit from current plan of care and skilled physical therapy to reduce pain, increase strength and stability, and return patient to PLOF.    Follow Up Recommendations  SNF     Equipment Recommendations  None recommended by PT    Recommendations for Other Services       Precautions / Restrictions Precautions Precautions: Fall Restrictions Weight Bearing Restrictions: Yes RLE Weight Bearing: Weight bearing as tolerated    Mobility  Bed Mobility Overal bed mobility: Needs Assistance Bed Mobility: Supine to Sit;Sit to Supine     Supine to sit: Mod assist;HOB elevated(for RLE) Sit to supine: Mod assist(for RLE)   General bed mobility comments: requires assistance moving RLE into and out of bed  Transfers Overall transfer level: Needs assistance Equipment used: Rolling walker (2 wheeled) Transfers: Sit to/from Stand Sit to Stand: Min guard         General transfer comment: Patient utilized UE's for sit to stand transfer today, One hand on bed one hand on walker  Ambulation/Gait Ambulation/Gait assistance: Min guard Gait  Distance (Feet): 52 Feet Assistive device: Rolling walker (2 wheeled) Gait Pattern/deviations: Step-to pattern;Decreased stance time - right;Decreased weight shift to right;Decreased step length - left;Trunk flexed     General Gait Details: (was just transfused)   Stairs             Wheelchair Mobility    Modified Rankin (Stroke Patients Only)       Balance Overall balance assessment: Needs assistance Sitting-balance support: Feet supported Sitting balance-Leahy Scale: Good Sitting balance - Comments: reaching inside and outside BOS without LOB   Standing balance support: During functional activity;Bilateral upper extremity supported;Single extremity supported Standing balance-Leahy Scale: Poor Standing balance comment: required RW for support to stand               High Level Balance Comments: Standing with SUE support x2 minutes no LOB, occasional ability to perform with no UE support with walker in front of patient and CGA with gait belt.             Cognition Arousal/Alertness: Awake/alert Behavior During Therapy: WFL for tasks assessed/performed Overall Cognitive Status: Within Functional Limits for tasks assessed                                        Exercises Total Joint Exercises Ankle Circles/Pumps: AAROM;Both;15 reps Quad Sets: AAROM;AROM;Both;10 reps Heel Slides: AAROM;Right;10 reps;Supine Hip ABduction/ADduction: AAROM;Right;10 reps Marching in Standing: Strengthening;Right;Left;10 reps;Standing(BUE support, tactile cues for upright posture) Other Exercises Other Exercises: ambulating in hallway with RW and CGA to nursing station and  back, pain increased to 10/10 per patient reports Other Exercises: Standing: tolerance with SUE support and occasional no UE support 2 -3 minutes no LOB with CGA and walker in front of patient. Standing marches with BUE support 10x each LE    General Comments General comments (skin integrity,  edema, etc.): video interpreter utilized      Pertinent Vitals/Pain Pain Assessment: 0-10 Pain Score: 10-Worst pain ever Pain Location: R hip  Pain Descriptors / Indicators: Operative site guarding Pain Intervention(s): Monitored during session;Repositioned;Other (comment)(told nurse for medication management after session)    Home Living                      Prior Function            PT Goals (current goals can now be found in the care plan section) Acute Rehab PT Goals Patient Stated Goal: to be able to be as independent as possible PT Goal Formulation: With patient Time For Goal Achievement: 02/23/18 Potential to Achieve Goals: Good Progress towards PT goals: Progressing toward goals    Frequency    BID      PT Plan Current plan remains appropriate    Co-evaluation              AM-PAC PT "6 Clicks" Mobility   Outcome Measure  Help needed turning from your back to your side while in a flat bed without using bedrails?: A Little Help needed moving from lying on your back to sitting on the side of a flat bed without using bedrails?: A Lot Help needed moving to and from a bed to a chair (including a wheelchair)?: A Lot Help needed standing up from a chair using your arms (e.g., wheelchair or bedside chair)?: A Little Help needed to walk in hospital room?: A Lot Help needed climbing 3-5 steps with a railing? : Total 6 Click Score: 13    End of Session Equipment Utilized During Treatment: Gait belt Activity Tolerance: Patient limited by fatigue;Patient tolerated treatment well Patient left: in bed;with call bell/phone within reach;with bed alarm set;with family/visitor present Nurse Communication: Mobility status;Patient requests pain meds PT Visit Diagnosis: Pain;Unsteadiness on feet (R26.81);Muscle weakness (generalized) (M62.81);Other abnormalities of gait and mobility (R26.89) Pain - Right/Left: Right Pain - part of body: Hip     Time:  [id number] PT Time Calculation (min) (ACUTE ONLY): 38 min  Charges:  $Gait Training: 8-22 mins $Therapeutic Exercise: 23-37 mins                     Precious BardMarina Japheth Diekman, PT, DPT     Precious BardMarina Magdalynn Davilla 02/11/2018, 3:44 PM

## 2018-02-12 MED ORDER — VITAMIN B-12 1000 MCG PO TABS: 1000.0000 ug | ORAL_TABLET | Freq: Every day | ORAL | Status: DC

## 2018-02-12 MED ORDER — ENOXAPARIN SODIUM 40 MG/0.4ML ~~LOC~~ SOLN: 40.0000 mg | SUBCUTANEOUS | 0 refills | Status: DC

## 2018-02-12 MED ORDER — TRAMADOL HCL 50 MG PO TABS: 50.0000 mg | ORAL_TABLET | ORAL | 0 refills | Status: DC | PRN

## 2018-02-12 MED ORDER — FERROUS SULFATE 325 (65 FE) MG PO TABS: 325.0000 mg | ORAL_TABLET | Freq: Every day | ORAL | 3 refills | Status: DC

## 2018-02-12 MED ORDER — FLEET ENEMA 7-19 GM/118ML RE ENEM: 1.0000 | ENEMA | Freq: Once | RECTAL | Status: DC | PRN

## 2018-02-12 MED ORDER — BISACODYL 10 MG RE SUPP
10.0000 mg | Freq: Once | RECTAL | Status: AC
  Administered 2018-02-12: 10 mg via RECTAL
  Filled 2018-02-12: qty 1

## 2018-02-12 MED ORDER — FOLIC ACID 400 MCG PO TABS: 400.0000 ug | ORAL_TABLET | Freq: Every day | ORAL | Status: DC

## 2018-02-12 NOTE — Progress Notes (Signed)
OT Cancellation Note  Patient Details Name: Chrisopher Housholder MRN: [id number] DOB: 1934/07/21   Cancelled Treatment:    Reason Eval/Treat Not Completed: Other (comment). Per PT/RN, pt too nauseated to participate in therapy at this time. Will continue to follow acutely and re-attempt OT treatment at later date/time as pt is able to participate.   Richrd Prime, MPH, MS, OTR/L ascom [phone] 02/12/18, 2:21 PM

## 2018-02-12 NOTE — Progress Notes (Signed)
   Subjective: 4 Days Post-Op Procedure(s) (LRB): TOTAL HIP ARTHROPLASTY ANTERIOR APPROACH (Right) CANNULATED SCREW REMOVAL FROM RIGHT HIP (Right) Patient reports pain as mild. Overall pain improving.  Patient is well, and has had no acute complaints or problems Denies any CP, SOB, ABD pain. We will continue therapy today.   ASL interpreter on ipad used for HPI and consent for Blood transfusion  Objective: Vital signs in last 24 hours: Temp:  [98.3 F (36.8 C)-99 F (37.2 C)] 99 F (37.2 C) (01/13 0001) Pulse Rate:  [72-78] 78 (01/13 0001) Resp:  [17] 17 (01/13 0001) BP: (101-107)/(45-62) 101/45 (01/13 0001) SpO2:  [96 %-97 %] 97 % (01/13 0001)  Intake/Output from previous day: 01/12 0701 - 01/13 0700 In: 360 [P.O.:360] Out: 0  Intake/Output this shift: No intake/output data recorded.  Recent Labs    02/10/18 0407 02/10/18 1457 02/11/18 0515 02/11/18 1450  HGB 7.4* 8.0* 7.8* 7.8*   Recent Labs    02/11/18 0515 02/11/18 1450  WBC 6.1 5.4  RBC 2.48* 2.43*  HCT 23.6* 23.2*  PLT 122* 144*   Recent Labs    02/10/18 0407  NA 137  K 4.0  CL 104  CO2 29  BUN 9  CREATININE 0.58*  GLUCOSE 113*  CALCIUM 8.0*   No results for input(s): LABPT, INR in the last 72 hours.  EXAM General - Patient is Alert, Appropriate and Oriented Extremity - Neurovascular intact Sensation intact distally Intact pulses distally Dorsiflexion/Plantar flexion intact No cellulitis present Compartment soft Dressing - dressing C/D/I and no drainage, Praveena intact. NO drainage Motor Function - intact, moving foot and toes well on exam.   Past Medical History:  Diagnosis Date  . Anemia   . Anxiety   . BPH with obstruction/lower urinary tract symptoms   . Calculus, kidney   . Cellulitis of calf right  . Deaf   . Elevated PSA   . GERD (gastroesophageal reflux disease)   . History of kidney stones   . HLD (hyperlipidemia)   . Hypothyroidism   . IBS (irritable bowel syndrome)    . Restless leg syndrome     Assessment/Plan:   4 Days Post-Op Procedure(s) (LRB): TOTAL HIP ARTHROPLASTY ANTERIOR APPROACH (Right) CANNULATED SCREW REMOVAL FROM RIGHT HIP (Right) Active Problems:   Status post total hip replacement, right  Estimated body mass index is 21.29 kg/m as calculated from the following:   Height as of this encounter: 5\' 8"  (1.727 m).   Weight as of this encounter: 63.5 kg. Advance diet Up with therapy  Needs bowel movement Acute post op blood loss anemia - Hgb 7.8, stable after 1 unit of packed red blood cells 2 days ago. Patient asymptomatic. Continue with iron supplementation. Care management to assist with discharge to SNF today pending BM   DVT Prophylaxis - Lovenox, TED hose and SCDs Weight-Bearing as tolerated to right leg   T. Cranston Neighborhris Brenlee Koskela, PA-C [facility] 02/12/2018, 7:45 AM

## 2018-02-12 NOTE — Progress Notes (Signed)
PT Cancellation Note  Patient Details Name: Ronald Huerta MRN: [id number] DOB: [birth year]   Cancelled Treatment:    Reason Eval/Treat Not Completed: Other (comment).  Per RN pt nauseous and not a good time to participate with therapy.  Will attempt to see pt again later today.   [name] PT, DPT 02/12/2018, 1:35 PM

## 2018-02-12 NOTE — Discharge Instructions (Signed)

## 2018-02-12 NOTE — Discharge Summary (Addendum)
Physician Discharge Summary  Patient ID: Ronald Huerta MRN: [id number] DOB/AGE: August 19, 1934 84 y.o.  Admit date: 02/08/2018 Discharge date: 02/12/2018  Admission Diagnoses:  STATUS POST RIGHT HIP CANNULATED PINNING   Discharge Diagnoses: Patient Active Problem List   Diagnosis Date Noted  . Status post total hip replacement, right 02/08/2018  . Closed right hip fracture (HCC) 12/01/2017  . Cellulitis 04/11/2015  . Cellulitis of leg, left 04/03/2015  . Left leg cellulitis 04/03/2015  . BPH with obstruction/lower urinary tract symptoms 02/10/2015  . History of elevated PSA 02/10/2015  . Acquired hypothyroidism 04/01/2014  . Deaf, nonspeaking 04/01/2014  . RLS (restless legs syndrome) 04/01/2014    Past Medical History:  Diagnosis Date  . Anemia   . Anxiety   . BPH with obstruction/lower urinary tract symptoms   . Calculus, kidney   . Cellulitis of calf right  . Deaf   . Elevated PSA   . GERD (gastroesophageal reflux disease)   . History of kidney stones   . HLD (hyperlipidemia)   . Hypothyroidism   . IBS (irritable bowel syndrome)   . Restless leg syndrome      Transfusion: 1 unit PRBC   Consultants (if any):   Discharged Condition: Improved  Hospital Course: Ronald CabalFranklin Delano Husain is an 84 y.o. male who was admitted 02/08/2018 with a diagnosis of <principal problem not specified> and went to the operating room on 02/08/2018 and underwent the above named procedures.    Surgeries: Procedure(s): TOTAL HIP ARTHROPLASTY ANTERIOR APPROACH CANNULATED SCREW REMOVAL FROM RIGHT HIP on 02/08/2018 Patient tolerated the surgery well. Taken to PACU where she was stabilized and then transferred to the orthopedic floor.  Started on Lovenox 30 mg q 24 hrs. Foot pumps applied bilaterally at 80 mm. Heels elevated on bed with rolled towels. No evidence of DVT. Negative Homan. Physical therapy started on day #1 for gait training and transfer. OT started day #1 for ADL and assisted  devices.  Patient's foley was d/c on day #1. Patient's IV was d/c on day #2. On post op day 2 Hgb dropped to 7.4, patient received 1 unit of PRBC. Hgb up to 8.0 and 7.8  On post op day #4 patient was stable and ready for discharge to SNF.  Implants:  Medacta AMIS 9 standard stem with 58 mm Mpact DM cup and liner with XXL metal 28 mm head  He was given perioperative antibiotics:  Anti-infectives (From admission, onward)   Start     Dose/Rate Route Frequency Ordered Stop   02/08/18 1613  ceFAZolin (ANCEF) 1-4 GM/50ML-% IVPB    Note to Pharmacy:  Vanessa Ralphsollins, Erica   : cabinet override      02/08/18 1613 02/09/18 0429   02/08/18 1600  ceFAZolin (ANCEF) IVPB 1 g/50 mL premix     1 g 100 mL/hr over 30 Minutes Intravenous Every 6 hours 02/08/18 1255 02/09/18 1859   02/08/18 1019  gentamicin (GARAMYCIN) 80 mg in sodium chloride 0.9 % 500 mL irrigation  Status:  Discontinued       As needed 02/08/18 1019 02/08/18 1128   02/08/18 0816  ceFAZolin (ANCEF) 2-4 GM/100ML-% IVPB    Note to Pharmacy:  Lorrene ReidJackson, Pamela   : cabinet override      02/08/18 0816 02/08/18 1008   02/07/18 2215  ceFAZolin (ANCEF) IVPB 2g/100 mL premix     2 g 200 mL/hr over 30 Minutes Intravenous  Once 02/07/18 2203 02/08/18 1038    .  He was given sequential  compression devices, early ambulation, and Lovenox, TEDs for DVT prophylaxis.  He benefited maximally from the hospital stay and there were no complications.    Recent vital signs:  Vitals:   02/12/18 0001 02/12/18 0932  BP: (!) 101/45 (!) 115/48  Pulse: 78 75  Resp: 17   Temp: 99 F (37.2 C) 97.8 F (36.6 C)  SpO2: 97% 97%    Recent laboratory studies:  Lab Results  Component Value Date   HGB 7.8 (L) 02/11/2018   HGB 7.8 (L) 02/11/2018   HGB 8.0 (L) 02/10/2018   Lab Results  Component Value Date   WBC 5.4 02/11/2018   PLT 144 (L) 02/11/2018   Lab Results  Component Value Date   INR 1.05 02/06/2018   Lab Results  Component Value Date   NA 137  02/10/2018   K 4.0 02/10/2018   CL 104 02/10/2018   CO2 29 02/10/2018   BUN 9 02/10/2018   CREATININE 0.58 (L) 02/10/2018   GLUCOSE 113 (H) 02/10/2018    Discharge Medications:   Allergies as of 02/12/2018   No Known Allergies     Medication List    STOP taking these medications   aspirin EC 81 MG tablet     TAKE these medications   Biotin 1 MG Caps Take by mouth.daily   Co Q-10 Vitamin E Fish Oil 60-90-25-200 Caps Take by mouth.daily   docusate sodium 100 MG capsule Commonly known as:  COLACE Take 1 capsule (100 mg total) by mouth 2 (two) times daily.   enoxaparin 40 MG/0.4ML injection Commonly known as:  LOVENOX Inject 0.4 mLs (40 mg total) into the skin daily.for 10 days   feeding supplement (ENSURE ENLIVE) Liqd Take 237 mLs by mouth 2 (two) times daily between meals.   ferrous sulfate 325 (65 FE) MG tablet Take 1 tablet (325 mg total) by mouth daily with breakfast. What changed:    how much to take  when to take this   finasteride 5 MG tablet Commonly known as:  PROSCAR Take 1 tablet (5 mg total) by mouth daily.   FISH OIL PO Take by mouth.1000 mg daily   folic acid 400 MCG tablet Commonly known as:  FOLVITE Take 1 tablet (400 mcg total) by mouth daily. What changed:    how much to take  when to take this   gabapentin 100 MG capsule Commonly known as:  NEURONTIN take 1 capsule by mouth three times a day   levothyroxine 25 MCG tablet Commonly known as:  SYNTHROID, LEVOTHROID Take 25 mcg by mouth daily before breakfast.   levothyroxine 25 MCG tablet Commonly known as:  SYNTHROID, LEVOTHROID Take 25 mcg by mouth daily before breakfast.   MULTI-VITAMINS Tabs Take by mouth.one daily   nystatin cream Commonly known as:  MYCOSTATIN Apply 1 application topically 2 (two) times daily.   nystatin-triamcinolone ointment Commonly known as:  MYCOLOG Apply 1 application topically 2 (two) times daily.   omeprazole 20 MG capsule Commonly known  as:  PRILOSEC Take 20 mg by mouth daily.   pantoprazole 40 MG tablet Commonly known as:  PROTONIX Take 40 mg daily by mouth.   tamsulosin 0.4 MG Caps capsule Commonly known as:  FLOMAX Take 1 capsule (0.4 mg total) by mouth daily.   terazosin 5 MG capsule Commonly known as:  HYTRIN Take 1 capsule (5 mg total) by mouth daily.   traMADol 50 MG tablet Commonly known as:  ULTRAM Take 1 tablet (50 mg total) by mouth  every 4 (four) hours as needed.for mild pain What changed:  reasons to take this   triamcinolone 0.025 % ointment Commonly known as:  KENALOG Apply 1 application topically 2 (two) times daily.   vitamin B-12 1000 MCG tablet Commonly known as:  CYANOCOBALAMIN Take 1 tablet (1,000 mcg total) by mouth daily. What changed:    how much to take  when to take this       Diagnostic Studies: Dg Hip Operative Unilat W Or W/o Pelvis Right  Result Date: 02/08/2018 CLINICAL DATA:  Removal of cannulated screws with right total hip replacement EXAM: OPERATIVE right HIP (WITH PELVIS IF PERFORMED) 3 VIEWS TECHNIQUE: Fluoroscopic spot image(s) were submitted for interpretation post-operatively. COMPARISON:  Right femur films of 12/01/2016 FINDINGS: Three cannulated screws were removed. The next 2 images show placement of acetabular and femoral components of the right hip replacement which on the few images obtained appear to be in good position with no complicating features. The femoral stem is not included. IMPRESSION: Removal of cannulated screws with right hip replacement performed. No evidence of complication on the images obtained. Electronically Signed   By: Dwyane Dee M.D.   On: 02/08/2018 11:26   Dg Hip Unilat W Or W/o Pelvis 2-3 Views Right  Result Date: 02/08/2018 CLINICAL DATA:  Post total right hip arthroplasty. EXAM: DG HIP (WITH OR WITHOUT PELVIS) 2-3V RIGHT COMPARISON:  Intraoperative image from the same date. FINDINGS: Post recent total right hip arthroplasty with normal  alignment of the orthopedic hardware. No evidence of fracture. Expected soft tissue edema and emphysema. Surgical drain and skin staples are in place. IMPRESSION: Post total right hip arthroplasty without evidence of immediate complications. Electronically Signed   By: Ted Mcalpine M.D.   On: 02/08/2018 12:34    Disposition: Discharge disposition: 03-Skilled Nursing Facility          Contact information for follow-up providers    [name], MD Follow up in 2 week(s).   Specialty:  Orthopedic Surgery Contact information: [address] [place] [address] [phone]            Contact information for after-discharge care    Destination    [facility] Preferred SNF .   Service:  Skilled Nursing Contact information: [address] [place] [address] [phone]                   Signed: Patience Musca 02/12/2018, 2:45 PM

## 2018-02-12 NOTE — Progress Notes (Signed)
Physical Therapy Treatment Patient Details Name: Ronald Huerta MRN: [id number] DOB: March 28, 1934 Today's Date: 02/12/2018    History of Present Illness Pt is a 84 y/o M s/p R THA.  Pt's PMH includes deafness.    PT Comments    Ronald Huerta made good progress toward mobility goals; however, he continues to require assist with transfers and ambulation.  Cues required for safety with transfers.  Pt remains a high falls risk and remains unable to safety perform ADLs and ambulation without assist. Ambulatory distance improved; however, pt requires several rest breaks due to fatigue and pain and requires assist. Cues required for improved posture while ambulating. Follow up recommendations remain appropriate.    Follow Up Recommendations  SNF     Equipment Recommendations  None recommended by PT    Recommendations for Other Services       Precautions / Restrictions Precautions Precautions: Fall Restrictions Weight Bearing Restrictions: Yes RLE Weight Bearing: Weight bearing as tolerated Other Position/Activity Restrictions: Watch HgB    Mobility  Bed Mobility Overal bed mobility: Needs Assistance Bed Mobility: Supine to Sit     Supine to sit: HOB elevated;Min assist     General bed mobility comments: Assist advancing RLE to EOB.  Cues for sequencing.  Increased time and effort.   Transfers Overall transfer level: Needs assistance Equipment used: Rolling walker (2 wheeled) Transfers: Sit to/from Stand Sit to Stand: Min guard         General transfer comment: Pt slow to rise.  Requires cues to bring RW back with him as he prepares to sit.    Ambulation/Gait Ambulation/Gait assistance: Min guard Gait Distance (Feet): 160 Feet Assistive device: Rolling walker (2 wheeled) Gait Pattern/deviations: Decreased stance time - right;Decreased weight shift to right;Decreased step length - left;Trunk flexed;Step-through pattern Gait velocity: decreased   General Gait  Details: Cues for upright posture and to relax UEs.  Requires several standing rest breaks due to fatigue and pain.     Stairs             Wheelchair Mobility    Modified Rankin (Stroke Patients Only)       Balance Overall balance assessment: Needs assistance Sitting-balance support: Feet supported;No upper extremity supported Sitting balance-Leahy Scale: Good     Standing balance support: During functional activity;Bilateral upper extremity supported Standing balance-Leahy Scale: Fair Standing balance comment: Relies on UE support to ambulate, able to stand statically without UE support                            Cognition Arousal/Alertness: Awake/alert Behavior During Therapy: WFL for tasks assessed/performed Overall Cognitive Status: Within Functional Limits for tasks assessed                                        Exercises Total Joint Exercises Ankle Circles/Pumps: AAROM;Both;10 reps;Supine Quad Sets: Both;10 reps;Strengthening;Supine Hip ABduction/ADduction: 10 reps;Strengthening;Both;Seated;Other (comment)(isometric Bil hip adduction with pillow) Knee Flexion: Both;10 reps;Seated    General Comments General comments (skin integrity, edema, etc.): Virtual interpreter utilized      Pertinent Vitals/Pain Pain Assessment: 0-10 Pain Score: 8 (8/10 while ambulating, 2/10 at end of session at rest) Pain Location: R hip Pain Descriptors / Indicators: Aching;Discomfort;Grimacing Pain Intervention(s): Limited activity within patient's tolerance;Monitored during session;Repositioned;Utilized relaxation techniques    Home Living  Prior Function            PT Goals (current goals can now be found in the care plan section) Acute Rehab PT Goals Patient Stated Goal: to be able to be as independent as possible PT Goal Formulation: With patient Time For Goal Achievement: 02/23/18 Potential to Achieve Goals:  Good Progress towards PT goals: Progressing toward goals    Frequency    BID      PT Plan Current plan remains appropriate    Co-evaluation              AM-PAC PT "6 Clicks" Mobility   Outcome Measure  Help needed turning from your back to your side while in a flat bed without using bedrails?: A Little Help needed moving from lying on your back to sitting on the side of a flat bed without using bedrails?: A Lot Help needed moving to and from a bed to a chair (including a wheelchair)?: A Lot Help needed standing up from a chair using your arms (e.g., wheelchair or bedside chair)?: A Lot Help needed to walk in hospital room?: A Lot Help needed climbing 3-5 steps with a railing? : A Lot 6 Click Score: 13    End of Session Equipment Utilized During Treatment: Gait belt Activity Tolerance: Patient limited by fatigue Patient left: with call bell/phone within reach;in chair;with chair alarm set;with family/visitor present;with SCD's reapplied Nurse Communication: Mobility status PT Visit Diagnosis: Pain;Unsteadiness on feet (R26.81);Muscle weakness (generalized) (M62.81);Other abnormalities of gait and mobility (R26.89) Pain - Right/Left: Right Pain - part of body: Hip     Time: 1275-1700 PT Time Calculation (min) (ACUTE ONLY): 46 min  Charges:  $Gait Training: 23-37 mins $Therapeutic Exercise: 8-22 mins                     [name] PT, DPT 02/12/2018, 1:29 PM

## 2018-02-12 NOTE — Care Management Important Message (Signed)
Important Message  Patient Details  Name: Ronald Huerta MRN: [id number] Date of Birth: [birth year]   Medicare Important Message Given:       Verita SchneidersKathy A Tanish Sinkler 02/12/2018, 10:45 AM

## 2018-02-12 NOTE — Progress Notes (Addendum)
Patient is medically stable for D/C to [facility] today. Per Inetta Fermo [facility] liaison [facility] authorization has been received and patient can come today to room 802. RN will call report and arrange EMS for transport. Clinical Child psychotherapist (CSW) sent D/C orders to [facility] via [facility]. CSW utilized a sign language interpreter via Ipad and made patient aware of above. Per patient his son [place] is aware of D/C today. CSW left patient's sons [place] and [place] voicemails. Please reconsult if future social work needs arise. CSW signing off.   Patient's son Doristine Church called CSW back and was made aware of D/C today.   [employer], LCSW [phone]

## 2018-02-12 NOTE — Progress Notes (Signed)
EMS left without copy of AVS. AVS faxed to [phone].

## 2018-02-12 NOTE — Clinical Social Work Placement (Addendum)
   CLINICAL SOCIAL WORK PLACEMENT  NOTE  Date:  02/12/2018  Patient Details  Name: Ronald Huerta MRN: [id number] Date of Birth: [birth year]  Clinical Social Work is seeking post-discharge placement for this patient at the Skilled  Nursing Facility level of care (*CSW will initial, date and re-position this form in  chart as items are completed):  Yes   Patient/family provided with [place] Clinical Social Work Department's list of facilities offering this level of care within the geographic area requested by the patient (or if unable, by the patient's family).  Yes   Patient/family informed of their freedom to choose among providers that offer the needed level of care, that participate in Medicare, Medicaid or managed care program needed by the patient, have an available bed and are willing to accept the patient.  Yes   Patient/family informed of [facility]'s ownership interest in [facility] and [facility], as well as of the fact that they are under no obligation to receive care at these facilities.  PASRR submitted to EDS on       PASRR number received on       Existing PASRR number confirmed on 02/08/18     FL2 transmitted to all facilities in geographic area requested by pt/family on 02/08/18     FL2 transmitted to all facilities within larger geographic area on       Patient informed that his/her managed care company has contracts with or will negotiate with certain facilities, including the following:        Yes   Patient/family informed of bed offers received.  Patient chooses bed at ([facility] )     Physician recommends and patient chooses bed at      Patient to be transferred to ([facility] ) on 02/12/18.  Patient to be transferred to facility by [facility] )     Patient family notified on 02/12/18 of transfer.  Name of family member notified:  (CSW left patient's sons [place] and Doristine Church voicemails. ) Patient's son Doristine Church called CSW back and was made  aware of D/C today.   PHYSICIAN       Additional Comment:    _______________________________________________ Yoshito Gaza, Darleen Crocker, LCSW 02/12/2018, 11:40 AM

## 2018-02-12 NOTE — Care Management Important Message (Signed)
Important Message  Patient Details  Name: Ronald Huerta MRN: [id number] Date of Birth: 04-20-1934   Medicare Important Message Given:  Yes    Olegario Messier A [name] 02/12/2018, 10:47 AM

## 2019-08-16 ENCOUNTER — Emergency Department: Payer: Medicare Other

## 2019-08-16 ENCOUNTER — Encounter: Payer: Self-pay | Admitting: Emergency Medicine

## 2019-08-16 ENCOUNTER — Emergency Department
Admission: EM | Admit: 2019-08-16 | Discharge: 2019-08-16 | Disposition: A | Discharge status: Home / Self Care | Payer: Medicare Other | Attending: Emergency Medicine | Admitting: Emergency Medicine

## 2019-08-16 DIAGNOSIS — R079 Chest pain, unspecified: Secondary | ICD-10-CM | POA: Diagnosis present

## 2019-08-16 DIAGNOSIS — E039 Hypothyroidism, unspecified: Secondary | ICD-10-CM | POA: Diagnosis not present

## 2019-08-16 DIAGNOSIS — Z79899 Other long term (current) drug therapy: Secondary | ICD-10-CM | POA: Insufficient documentation

## 2019-08-16 LAB — CBC
HCT: 33.9 % — ABNORMAL LOW (ref 39.0–52.0)
Hemoglobin: 11.2 g/dL — ABNORMAL LOW (ref 13.0–17.0)
MCH: 32.4 pg (ref 26.0–34.0)
MCHC: 33 g/dL (ref 30.0–36.0)
MCV: 98 fL (ref 80.0–100.0)
Platelets: 209 10*3/uL (ref 150–400)
RBC: 3.46 MIL/uL — ABNORMAL LOW (ref 4.22–5.81)
RDW: 15 % (ref 11.5–15.5)
WBC: 5 10*3/uL (ref 4.0–10.5)
nRBC: 0 % (ref 0.0–0.2)

## 2019-08-16 LAB — BASIC METABOLIC PANEL
Anion gap: 8 (ref 5–15)
BUN: 19 mg/dL (ref 8–23)
CO2: 24 mmol/L (ref 22–32)
Calcium: 8.6 mg/dL — ABNORMAL LOW (ref 8.9–10.3)
Chloride: 107 mmol/L (ref 98–111)
Creatinine, Ser: 0.88 mg/dL (ref 0.61–1.24)
GFR calc Af Amer: >60 mL/min (ref >60)
GFR calc non Af Amer: >60 mL/min (ref >60)
Glucose, Bld: 100 mg/dL — ABNORMAL HIGH (ref 70–99)
Potassium: 4 mmol/L (ref 3.5–5.1)
Sodium: 139 mmol/L (ref 135–145)

## 2019-08-16 LAB — TROPONIN I (HIGH SENSITIVITY)
Troponin I (High Sensitivity): 5 ng/L (ref <18)
Troponin I (High Sensitivity): 4 ng/L (ref <18)

## 2019-08-16 MED ORDER — OMEPRAZOLE 20 MG PO CPDR: 20.0000 mg | DELAYED_RELEASE_CAPSULE | Freq: Every day | ORAL | 1 refills | Status: DC

## 2019-08-16 NOTE — ED Triage Notes (Signed)
Pt is deaf, interpreter unavailable, used writing to communicate. Pt reports he woke from sleeping this AM with intermittent right sided chest pain and pressure. Pt denies SOB, N/V.
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
## 2019-08-16 NOTE — Discharge Instructions (Signed)
The pain you are experiencing does not seem to be related to your heart.  Your EKG and cardiac enzymes today were normal, your chest x-ray also appears normal.  I suspect that your pain could be related to reflux and you were prescribed omeprazole to start taking to help reduce this pain.  Please take the omeprazole on a daily basis, whether you are having pain or not.  If you continue to have episodes of pain, you may take over-the-counter Pepcid as needed in addition to the omeprazole.  You should avoid eating just before bed, also avoid spicy foods and alcohol as much as possible.  Please schedule follow-up with your primary care doctor and return to the ER for reevaluation if you have any worsening symptoms.

## 2019-10-15 IMAGING — US US CAROTID DUPLEX BILAT
1 series · 13 of 24 positions shown · non-contrast
Comparison: None.

CLINICAL DATA: Cerebral infarction.  History of hyperlipidemia.

EXAM:
BILATERAL CAROTID DUPLEX ULTRASOUND
TECHNIQUE: Gray scale imaging, color Doppler and duplex ultrasound were
performed of bilateral carotid and vertebral arteries in the neck.

[Series 1: us carotid duplex bilat · 13 of 64 slices shown]
[im 1/64]
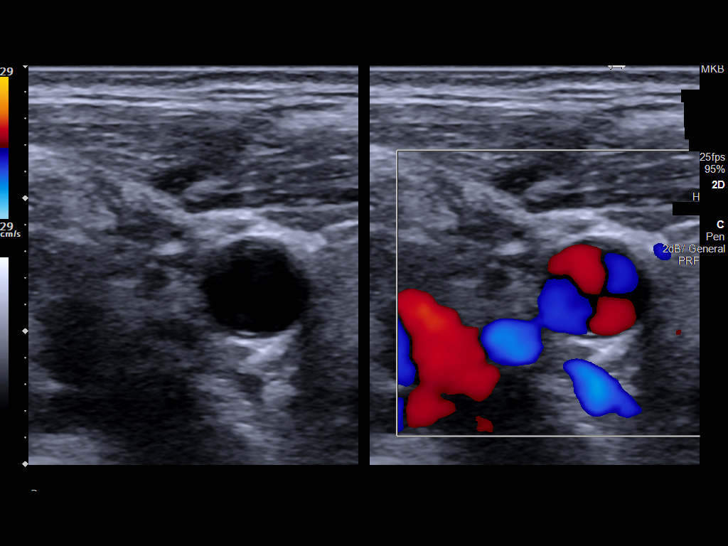
[im 6/64]
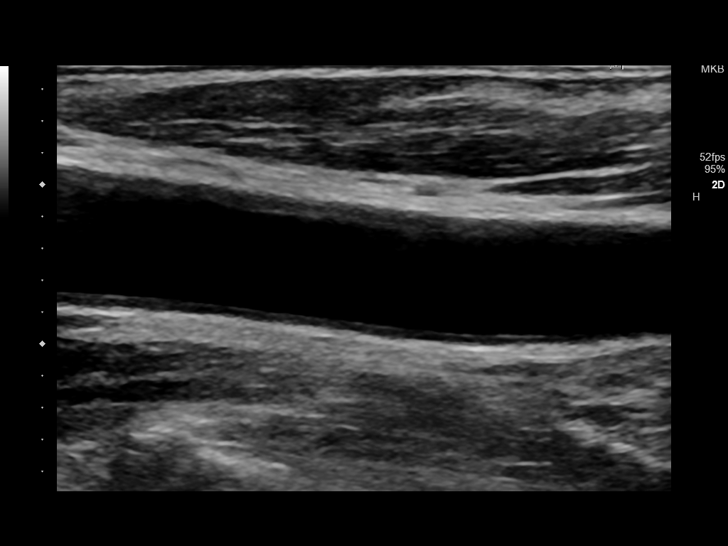
[im 11/64]
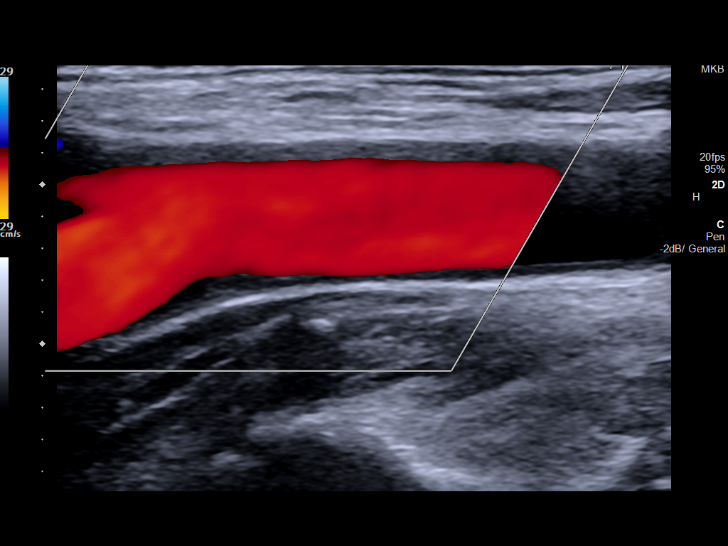
[im 17/64]
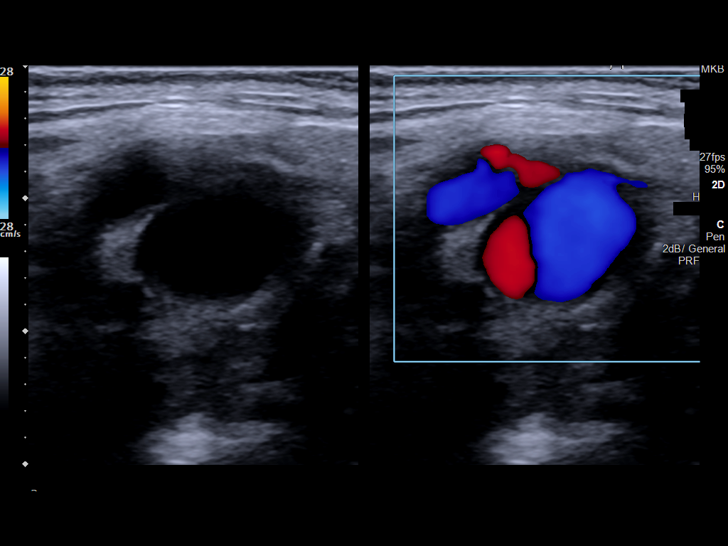
[im 22/64]
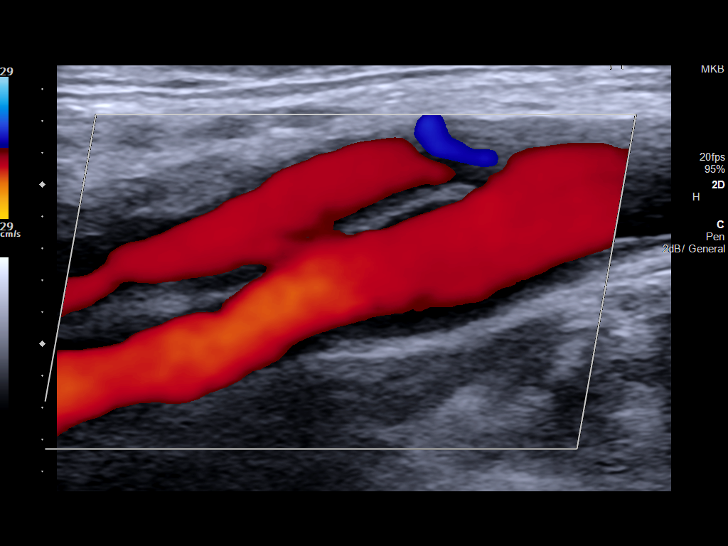
[im 28/64]
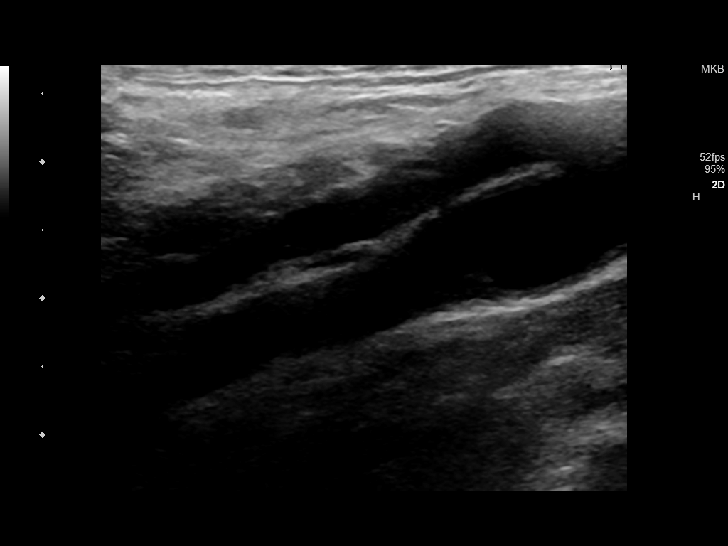
[im 33/64]
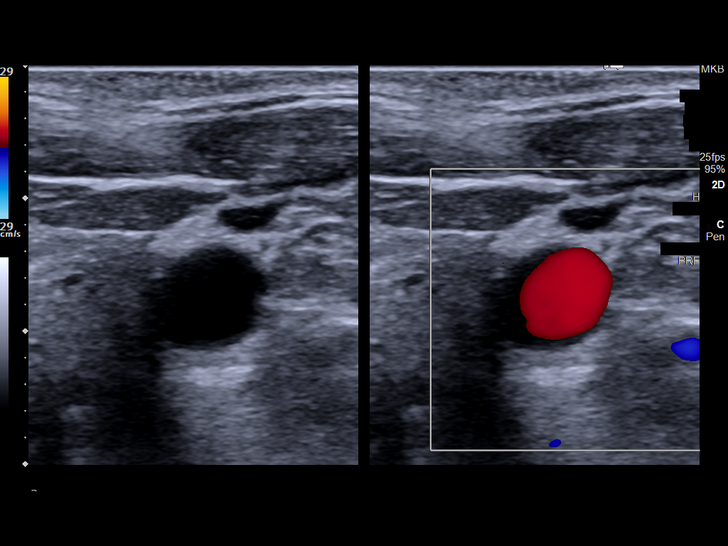
[im 36/64]
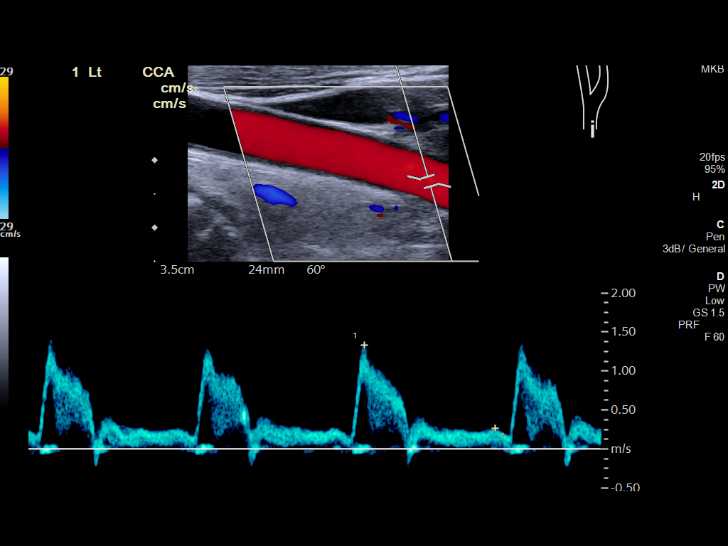
[im 42/64]
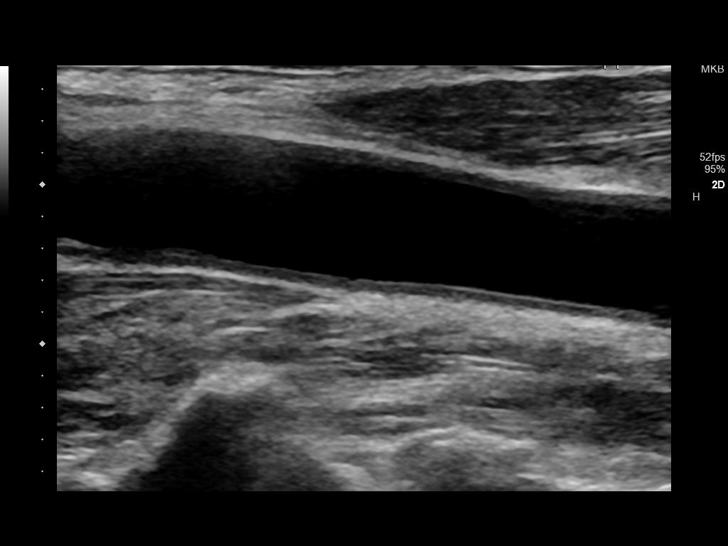
[im 47/64]
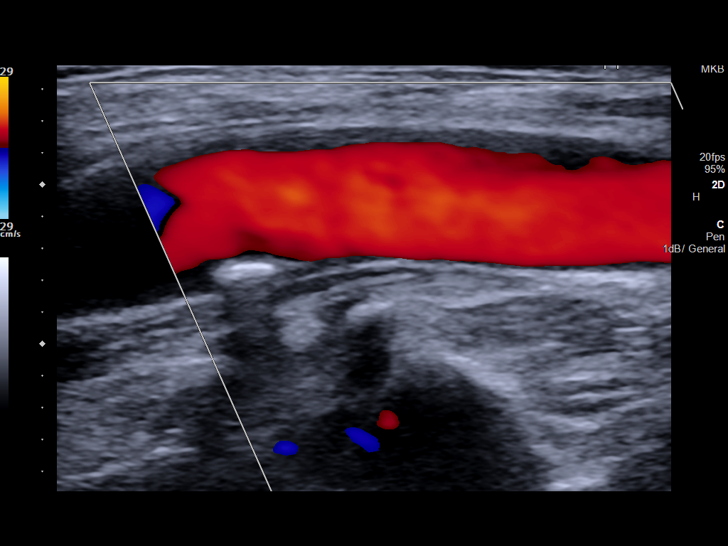
[im 53/64]
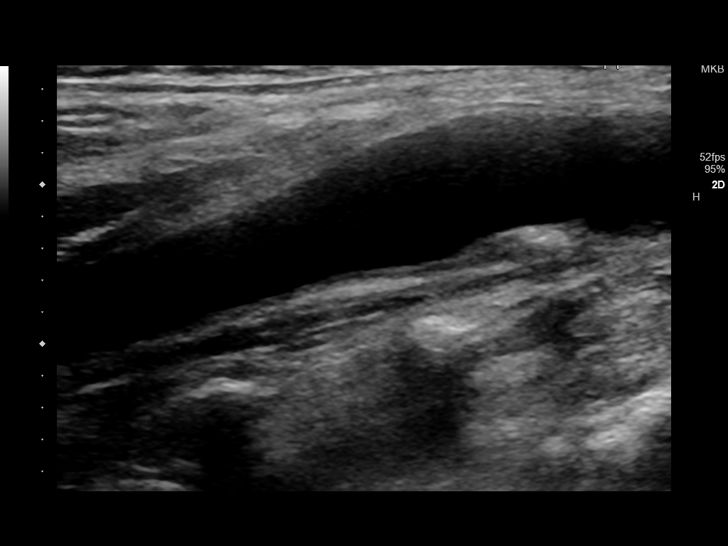
[im 58/64]
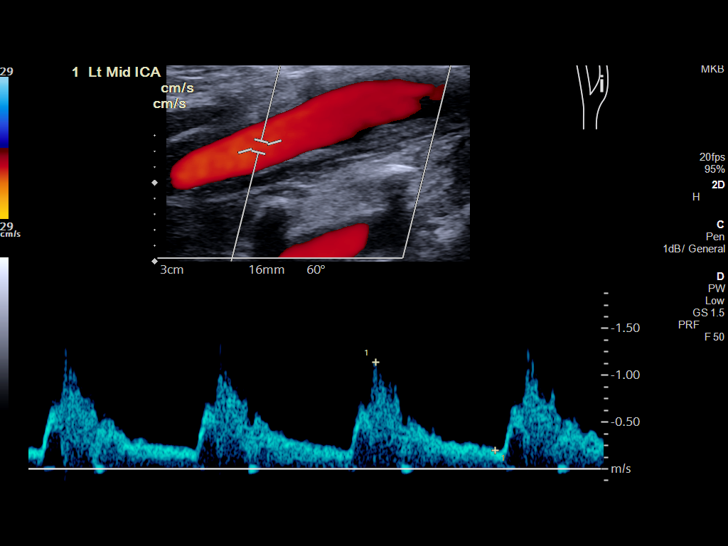
[im 64/64]
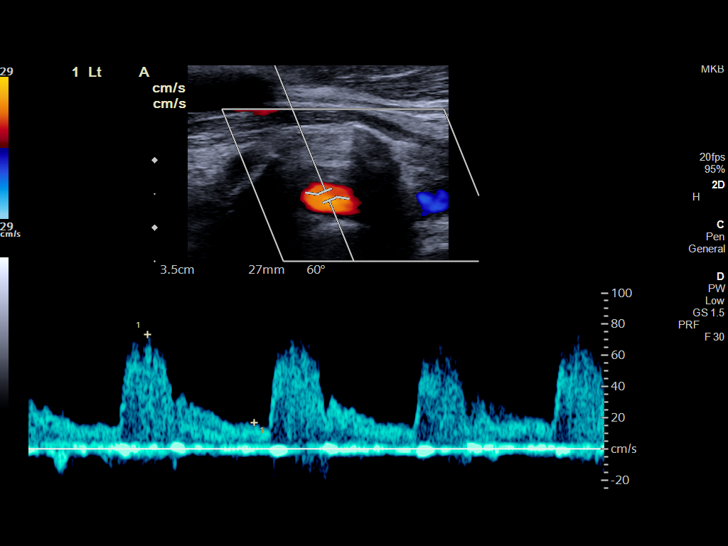

[13 of 24 positions shown; findings below may reference images not displayed]

FINDINGS: Criteria: Quantification of carotid stenosis is based on velocity
parameters that correlate the residual internal carotid diameter
with NASCET-based stenosis levels, using the diameter of the distal
internal carotid lumen as the denominator for stenosis measurement.

The following velocity measurements were obtained:

RIGHT

ICA:  110/24 cm/sec

CCA:  102/16 cm/sec

SYSTOLIC ICA/CCA RATIO:

ECA:  104 cm/sec

LEFT

ICA:  114/20 cm/sec

CCA:  104/22 cm/sec

SYSTOLIC ICA/CCA RATIO:

ECA:  150 cm/sec

RIGHT CAROTID ARTERY: There is a minimal amount of eccentric
echogenic plaque within the right carotid bulb (images 14 and 15).
There is a minimal amount of hypoechoic plaque involving the origin
and proximal aspects of the right internal carotid artery (image
22), not resulting in elevated peak systolic velocities within the
interrogated course of the right internal carotid artery to suggest
a hemodynamically significant stenosis.

RIGHT VERTEBRAL ARTERY:  Antegrade flow

LEFT CAROTID ARTERY: There is a minimal amount of eccentric
echogenic plaque within the left carotid bulb (image 47 image 50)
extending to involve the origin and proximal aspects of the left
internal carotid artery (image 54), not resulting in elevated peak
systolic velocities within the interrogated course of the left
internal carotid artery to suggest a hemodynamically significant
stenosis.

LEFT VERTEBRAL ARTERY:  Antegrade flow
IMPRESSION: Minimal amount of bilateral atherosclerotic plaque, left
subjectively greater than right, not resulting in a hemodynamically
significant stenosis within either internal carotid artery

## 2019-12-18 IMAGING — XA DG HIP (WITH PELVIS) OPERATIVE*R*
3 series · 12 of 12 positions shown · non-contrast
Comparison: Right femur films of 12/01/2016

CLINICAL DATA: Removal of cannulated screws with right total hip
replacement

EXAM:
OPERATIVE right HIP (WITH PELVIS IF PERFORMED) 3 VIEWS
TECHNIQUE: Fluoroscopic spot image(s) were submitted for interpretation
post-operatively.

[Series 1: ortho standard · 4 of 9 frames shown (1 of 3)]
[frame 1/9]
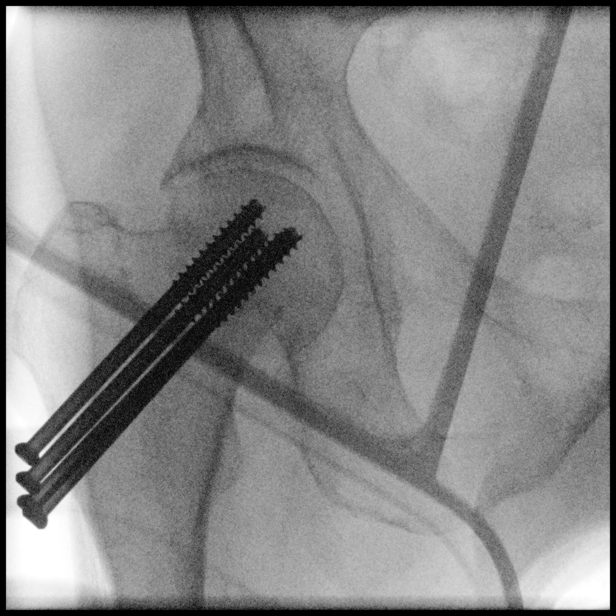
[frame 2/9]
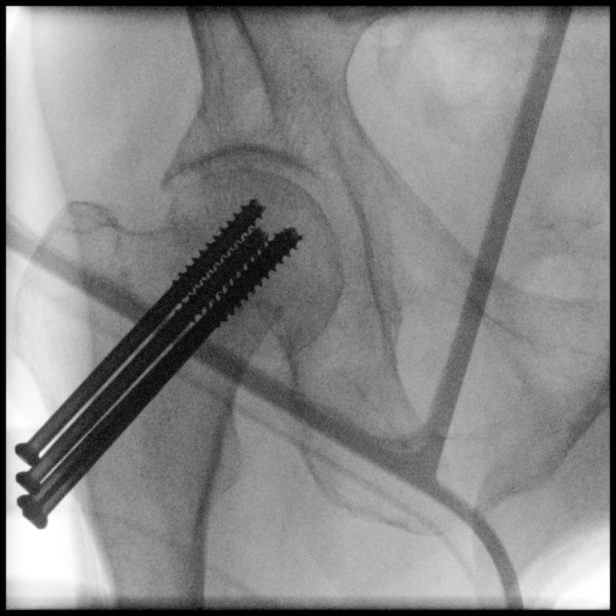
[frame 5/9]
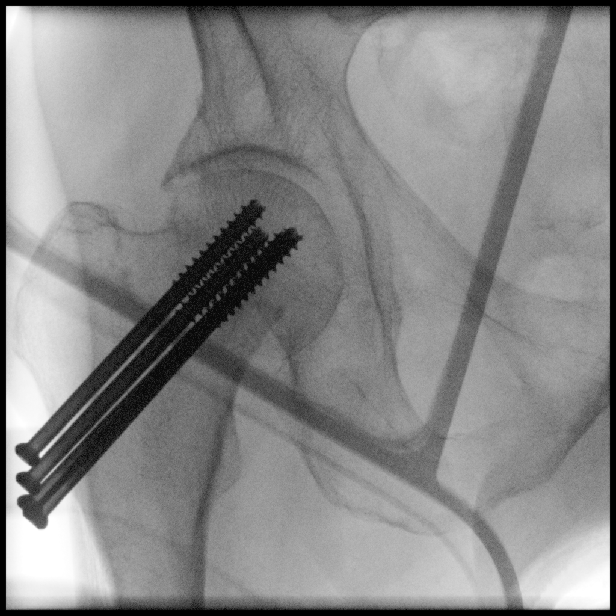
[frame 8/9]
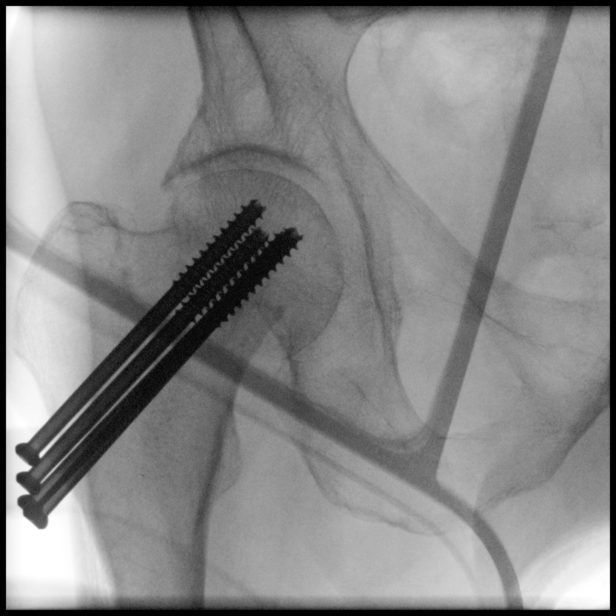

[Series 2: ortho standard · 4 of 14 frames shown (2 of 3)]
[frame 3/14]
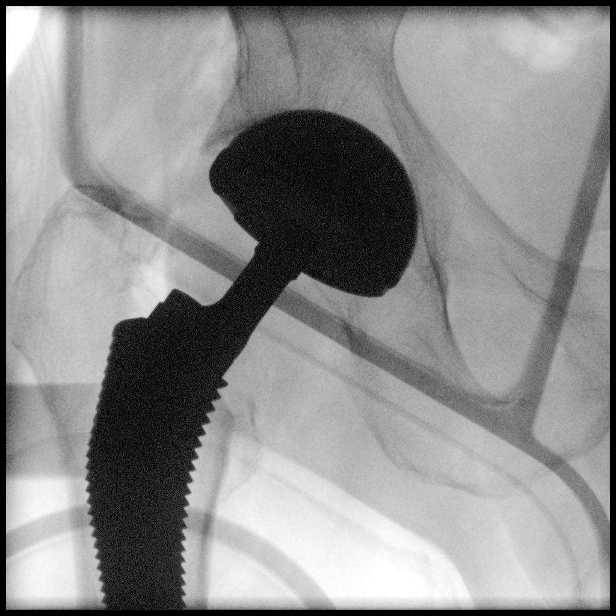
[frame 8/14]
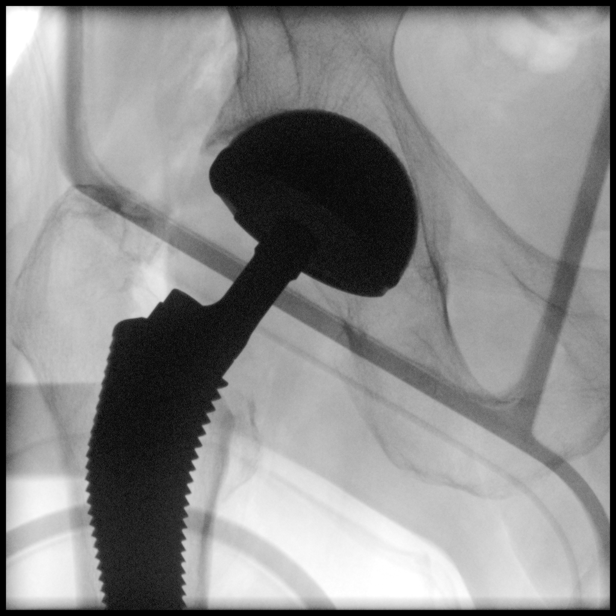
[frame 12/14]
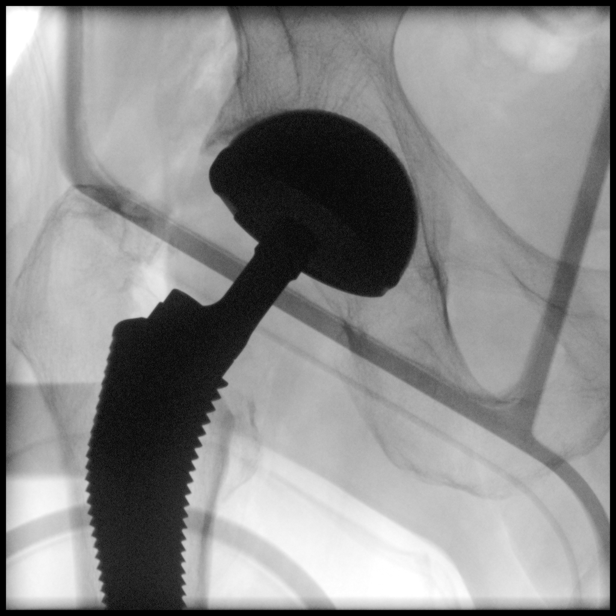
[frame 13/14]
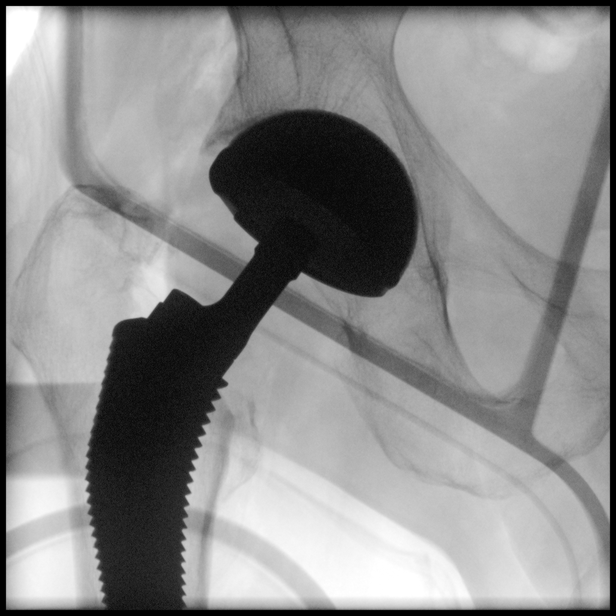

[Series 3: ortho standard · 4 of 14 frames shown (3 of 3)]
[frame 3/14]
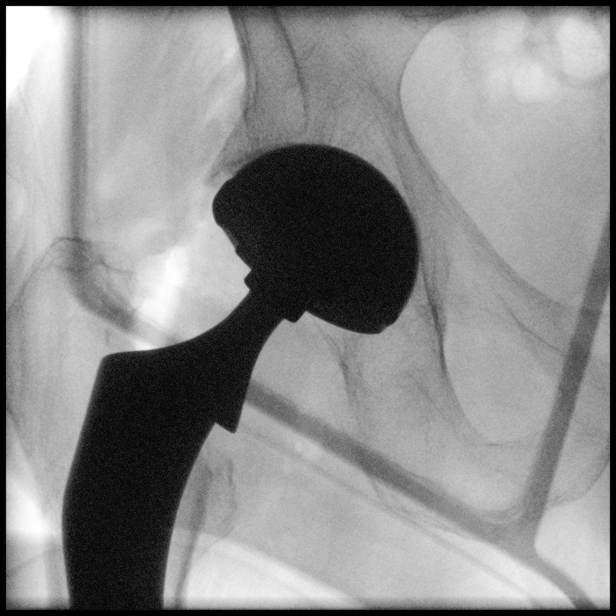
[frame 8/14]
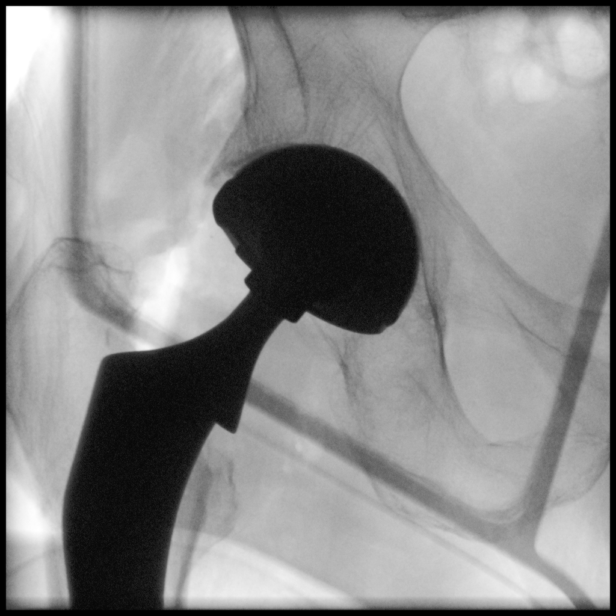
[frame 12/14]
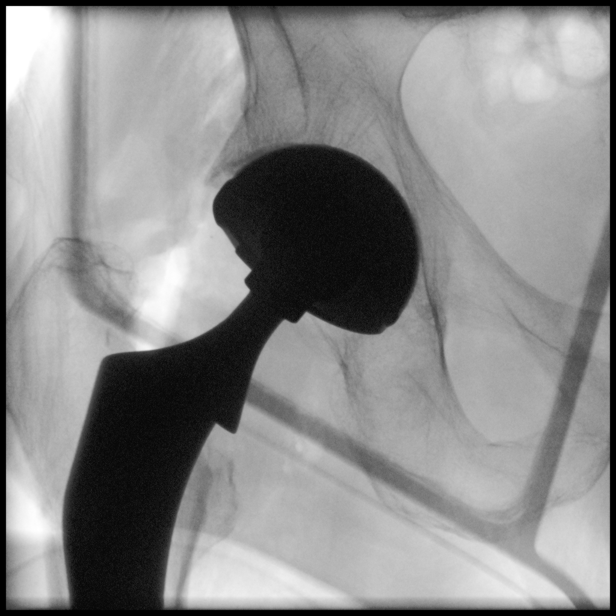
[frame 13/14]
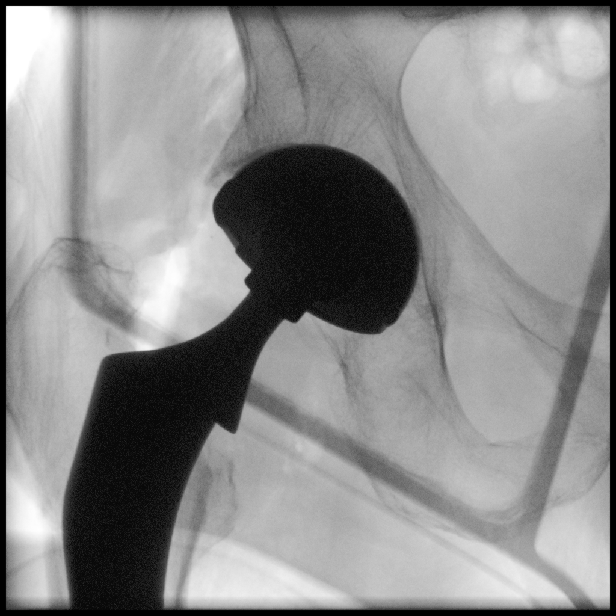

[12 of 12 positions shown; findings below may reference images not displayed]

FINDINGS: Three cannulated screws were removed. The next 2 images show
placement of acetabular and femoral components of the right hip
replacement which on the few images obtained appear to be in good
position with no complicating features. The femoral stem is not
included.
IMPRESSION: Removal of cannulated screws with right hip replacement performed.
No evidence of complication on the images obtained.

## 2020-02-12 ENCOUNTER — Emergency Department: Payer: Medicare Other

## 2020-02-12 ENCOUNTER — Other Ambulatory Visit: Payer: Self-pay

## 2020-02-12 ENCOUNTER — Emergency Department
Admission: EM | Admit: 2020-02-12 | Discharge: 2020-02-12 | Disposition: A | Discharge status: Home / Self Care | Payer: Medicare Other | Attending: Emergency Medicine | Admitting: Emergency Medicine

## 2020-02-12 ENCOUNTER — Encounter: Payer: Self-pay | Admitting: *Deleted

## 2020-02-12 DIAGNOSIS — M25562 Pain in left knee: Secondary | ICD-10-CM | POA: Diagnosis not present

## 2020-02-12 DIAGNOSIS — M79672 Pain in left foot: Secondary | ICD-10-CM | POA: Insufficient documentation

## 2020-02-12 DIAGNOSIS — Z5321 Procedure and treatment not carried out due to patient leaving prior to being seen by health care provider: Secondary | ICD-10-CM | POA: Insufficient documentation

## 2020-02-12 DIAGNOSIS — G8929 Other chronic pain: Secondary | ICD-10-CM | POA: Insufficient documentation

## 2020-02-12 LAB — CBC WITH DIFFERENTIAL/PLATELET
Abs Immature Granulocytes: 0.02 10*3/uL (ref 0.00–0.07)
Basophils Absolute: 0.1 10*3/uL (ref 0.0–0.1)
Basophils Relative: 1 %
Eosinophils Absolute: 0.1 10*3/uL (ref 0.0–0.5)
Eosinophils Relative: 2 %
HCT: 34.1 % — ABNORMAL LOW (ref 39.0–52.0)
Hemoglobin: 11.1 g/dL — ABNORMAL LOW (ref 13.0–17.0)
Immature Granulocytes: 0 %
Lymphocytes Relative: 31 %
Lymphs Abs: 2 10*3/uL (ref 0.7–4.0)
MCH: 32.7 pg (ref 26.0–34.0)
MCHC: 32.6 g/dL (ref 30.0–36.0)
MCV: 100.6 fL — ABNORMAL HIGH (ref 80.0–100.0)
Monocytes Absolute: 0.8 10*3/uL (ref 0.1–1.0)
Monocytes Relative: 12 %
Neutro Abs: 3.5 10*3/uL (ref 1.7–7.7)
Neutrophils Relative %: 54 %
Platelets: 218 10*3/uL (ref 150–400)
RBC: 3.39 MIL/uL — ABNORMAL LOW (ref 4.22–5.81)
RDW: 16 % — ABNORMAL HIGH (ref 11.5–15.5)
WBC: 6.5 10*3/uL (ref 4.0–10.5)
nRBC: 0 % (ref 0.0–0.2)

## 2020-02-12 LAB — COMPREHENSIVE METABOLIC PANEL
ALT: 19 U/L (ref 0–44)
AST: 21 U/L (ref 15–41)
Albumin: 4.1 g/dL (ref 3.5–5.0)
Alkaline Phosphatase: 104 U/L (ref 38–126)
Anion gap: 7 (ref 5–15)
BUN: 19 mg/dL (ref 8–23)
CO2: 28 mmol/L (ref 22–32)
Calcium: 9.2 mg/dL (ref 8.9–10.3)
Chloride: 104 mmol/L (ref 98–111)
Creatinine, Ser: 0.72 mg/dL (ref 0.61–1.24)
GFR, Estimated: >60 mL/min (ref >60)
Glucose, Bld: 101 mg/dL — ABNORMAL HIGH (ref 70–99)
Potassium: 4.1 mmol/L (ref 3.5–5.1)
Sodium: 139 mmol/L (ref 135–145)
Total Bilirubin: 0.6 mg/dL (ref 0.3–1.2)
Total Protein: 7.4 g/dL (ref 6.5–8.1)

## 2020-02-12 NOTE — ED Triage Notes (Signed)
Pt to ED reporting acute on chronic left sided foot pain. Pt has had left sided foot pain for years but recently he is having worsening left sided knee pain with walking. Intermittent swelling.

## 2020-02-21 ENCOUNTER — Emergency Department: Payer: Medicare Other

## 2020-02-21 ENCOUNTER — Other Ambulatory Visit: Payer: Self-pay

## 2020-02-21 ENCOUNTER — Encounter: Payer: Self-pay | Admitting: Emergency Medicine

## 2020-02-21 ENCOUNTER — Inpatient Hospital Stay
Admission: EM | Admit: 2020-02-21 | Discharge: 2020-02-22 | DRG: 300 | Disposition: A | Discharge status: Home Health | Payer: Medicare Other | Attending: Internal Medicine | Admitting: Internal Medicine

## 2020-02-21 DIAGNOSIS — Z96641 Presence of right artificial hip joint: Secondary | ICD-10-CM | POA: Diagnosis present

## 2020-02-21 DIAGNOSIS — N401 Enlarged prostate with lower urinary tract symptoms: Secondary | ICD-10-CM | POA: Diagnosis not present

## 2020-02-21 DIAGNOSIS — E039 Hypothyroidism, unspecified: Secondary | ICD-10-CM | POA: Diagnosis not present

## 2020-02-21 DIAGNOSIS — R112 Nausea with vomiting, unspecified: Secondary | ICD-10-CM | POA: Diagnosis present

## 2020-02-21 DIAGNOSIS — Z79899 Other long term (current) drug therapy: Secondary | ICD-10-CM | POA: Diagnosis not present

## 2020-02-21 DIAGNOSIS — R7401 Elevation of levels of liver transaminase levels: Secondary | ICD-10-CM | POA: Diagnosis not present

## 2020-02-21 DIAGNOSIS — I83009 Varicose veins of unspecified lower extremity with ulcer of unspecified site: Principal | ICD-10-CM | POA: Diagnosis present

## 2020-02-21 DIAGNOSIS — R7989 Other specified abnormal findings of blood chemistry: Secondary | ICD-10-CM

## 2020-02-21 DIAGNOSIS — S91002A Unspecified open wound, left ankle, initial encounter: Secondary | ICD-10-CM

## 2020-02-21 DIAGNOSIS — K2900 Acute gastritis without bleeding: Secondary | ICD-10-CM | POA: Diagnosis not present

## 2020-02-21 DIAGNOSIS — N138 Other obstructive and reflux uropathy: Secondary | ICD-10-CM | POA: Diagnosis not present

## 2020-02-21 DIAGNOSIS — L97321 Non-pressure chronic ulcer of left ankle limited to breakdown of skin: Secondary | ICD-10-CM | POA: Diagnosis not present

## 2020-02-21 DIAGNOSIS — Z800 Family history of malignant neoplasm of digestive organs: Secondary | ICD-10-CM | POA: Diagnosis not present

## 2020-02-21 DIAGNOSIS — K219 Gastro-esophageal reflux disease without esophagitis: Secondary | ICD-10-CM | POA: Diagnosis not present

## 2020-02-21 DIAGNOSIS — I83023 Varicose veins of left lower extremity with ulcer of ankle: Principal | ICD-10-CM | POA: Diagnosis present

## 2020-02-21 DIAGNOSIS — Z8042 Family history of malignant neoplasm of prostate: Secondary | ICD-10-CM | POA: Diagnosis not present

## 2020-02-21 DIAGNOSIS — H913 Deaf nonspeaking, not elsewhere classified: Secondary | ICD-10-CM | POA: Diagnosis not present

## 2020-02-21 DIAGNOSIS — I739 Peripheral vascular disease, unspecified: Secondary | ICD-10-CM | POA: Diagnosis present

## 2020-02-21 DIAGNOSIS — K589 Irritable bowel syndrome without diarrhea: Secondary | ICD-10-CM | POA: Diagnosis not present

## 2020-02-21 DIAGNOSIS — Z79890 Hormone replacement therapy (postmenopausal): Secondary | ICD-10-CM

## 2020-02-21 DIAGNOSIS — G2581 Restless legs syndrome: Secondary | ICD-10-CM | POA: Diagnosis present

## 2020-02-21 DIAGNOSIS — E785 Hyperlipidemia, unspecified: Secondary | ICD-10-CM | POA: Diagnosis not present

## 2020-02-21 DIAGNOSIS — L03116 Cellulitis of left lower limb: Secondary | ICD-10-CM | POA: Diagnosis present

## 2020-02-21 DIAGNOSIS — I872 Venous insufficiency (chronic) (peripheral): Secondary | ICD-10-CM | POA: Diagnosis present

## 2020-02-21 DIAGNOSIS — L97909 Non-pressure chronic ulcer of unspecified part of unspecified lower leg with unspecified severity: Secondary | ICD-10-CM | POA: Diagnosis present

## 2020-02-21 LAB — CBC WITH DIFFERENTIAL/PLATELET
Abs Immature Granulocytes: 0.03 10*3/uL (ref 0.00–0.07)
Basophils Absolute: 0.1 10*3/uL (ref 0.0–0.1)
Basophils Relative: 1 %
Eosinophils Absolute: 0 10*3/uL (ref 0.0–0.5)
Eosinophils Relative: 0 %
HCT: 35.2 % — ABNORMAL LOW (ref 39.0–52.0)
Hemoglobin: 11.5 g/dL — ABNORMAL LOW (ref 13.0–17.0)
Immature Granulocytes: 0 %
Lymphocytes Relative: 10 %
Lymphs Abs: 0.8 10*3/uL (ref 0.7–4.0)
MCH: 32.9 pg (ref 26.0–34.0)
MCHC: 32.7 g/dL (ref 30.0–36.0)
MCV: 100.6 fL — ABNORMAL HIGH (ref 80.0–100.0)
Monocytes Absolute: 0.5 10*3/uL (ref 0.1–1.0)
Monocytes Relative: 6 %
Neutro Abs: 6.6 10*3/uL (ref 1.7–7.7)
Neutrophils Relative %: 83 %
Platelets: 233 10*3/uL (ref 150–400)
RBC: 3.5 MIL/uL — ABNORMAL LOW (ref 4.22–5.81)
RDW: 15.9 % — ABNORMAL HIGH (ref 11.5–15.5)
WBC: 8 10*3/uL (ref 4.0–10.5)
nRBC: 0 % (ref 0.0–0.2)

## 2020-02-21 LAB — COMPREHENSIVE METABOLIC PANEL
ALT: 261 U/L — ABNORMAL HIGH (ref 0–44)
AST: 277 U/L — ABNORMAL HIGH (ref 15–41)
Albumin: 4 g/dL (ref 3.5–5.0)
Alkaline Phosphatase: 241 U/L — ABNORMAL HIGH (ref 38–126)
Anion gap: 13 (ref 5–15)
BUN: 16 mg/dL (ref 8–23)
CO2: 26 mmol/L (ref 22–32)
Calcium: 9.3 mg/dL (ref 8.9–10.3)
Chloride: 100 mmol/L (ref 98–111)
Creatinine, Ser: 0.71 mg/dL (ref 0.61–1.24)
GFR, Estimated: >60 mL/min (ref >60)
Glucose, Bld: 131 mg/dL — ABNORMAL HIGH (ref 70–99)
Potassium: 4 mmol/L (ref 3.5–5.1)
Sodium: 139 mmol/L (ref 135–145)
Total Bilirubin: 1.3 mg/dL — ABNORMAL HIGH (ref 0.3–1.2)
Total Protein: 7.6 g/dL (ref 6.5–8.1)

## 2020-02-21 LAB — LIPASE, BLOOD: Lipase: 52 U/L — ABNORMAL HIGH (ref 11–51)

## 2020-02-21 LAB — LACTIC ACID, PLASMA: Lactic Acid, Venous: 1.5 mmol/L (ref 0.5–1.9)

## 2020-02-21 MED ORDER — ADULT MULTIVITAMIN W/MINERALS CH
1.0000 | ORAL_TABLET | Freq: Every day | ORAL | Status: DC
  Administered 2020-02-22: 1 via ORAL
  Filled 2020-02-21: qty 1

## 2020-02-21 MED ORDER — TERAZOSIN HCL 5 MG PO CAPS
5.0000 mg | ORAL_CAPSULE | Freq: Every day | ORAL | Status: DC
  Administered 2020-02-22: 5 mg via ORAL
  Filled 2020-02-21 (×2): qty 1

## 2020-02-21 MED ORDER — GABAPENTIN 100 MG PO CAPS
100.0000 mg | ORAL_CAPSULE | Freq: Three times a day (TID) | ORAL | Status: DC
  Administered 2020-02-21 – 2020-02-22 (×3): 100 mg via ORAL
  Filled 2020-02-21 (×3): qty 1

## 2020-02-21 MED ORDER — ONDANSETRON HCL 4 MG PO TABS: 4.0000 mg | ORAL_TABLET | Freq: Four times a day (QID) | ORAL | Status: DC | PRN

## 2020-02-21 MED ORDER — FOLIC ACID 1 MG PO TABS
500.0000 ug | ORAL_TABLET | Freq: Every day | ORAL | Status: DC
  Administered 2020-02-22: 0.5 mg via ORAL
  Filled 2020-02-21: qty 1

## 2020-02-21 MED ORDER — IOHEXOL 300 MG/ML  SOLN
100.0000 mL | Freq: Once | INTRAMUSCULAR | Status: AC | PRN
  Administered 2020-02-21: 100 mL via INTRAVENOUS
  Filled 2020-02-21: qty 100

## 2020-02-21 MED ORDER — TRAMADOL HCL 50 MG PO TABS: 50.0000 mg | ORAL_TABLET | Freq: Four times a day (QID) | ORAL | Status: DC | PRN

## 2020-02-21 MED ORDER — PANTOPRAZOLE SODIUM 40 MG PO TBEC
40.0000 mg | DELAYED_RELEASE_TABLET | Freq: Every day | ORAL | Status: DC
  Administered 2020-02-22: 40 mg via ORAL
  Filled 2020-02-21: qty 1

## 2020-02-21 MED ORDER — FINASTERIDE 5 MG PO TABS: 5.0000 mg | ORAL_TABLET | Freq: Every day | ORAL | Status: DC

## 2020-02-21 MED ORDER — SODIUM CHLORIDE 0.9 % IV SOLN: INTRAVENOUS | Status: AC

## 2020-02-21 MED ORDER — BIOTIN 1 MG PO CAPS: 1.0000 | ORAL_CAPSULE | Freq: Every day | ORAL | Status: DC

## 2020-02-21 MED ORDER — ONDANSETRON 4 MG PO TBDP
4.0000 mg | ORAL_TABLET | Freq: Once | ORAL | Status: AC
  Administered 2020-02-21: 4 mg via ORAL
  Filled 2020-02-21: qty 1

## 2020-02-21 MED ORDER — ACETAMINOPHEN 325 MG PO TABS: 650.0000 mg | ORAL_TABLET | Freq: Four times a day (QID) | ORAL | Status: DC | PRN

## 2020-02-21 MED ORDER — ENOXAPARIN SODIUM 40 MG/0.4ML ~~LOC~~ SOLN
40.0000 mg | SUBCUTANEOUS | Status: DC
  Administered 2020-02-21: 40 mg via SUBCUTANEOUS
  Filled 2020-02-21: qty 0.4

## 2020-02-21 MED ORDER — TAMSULOSIN HCL 0.4 MG PO CAPS
0.4000 mg | ORAL_CAPSULE | Freq: Every day | ORAL | Status: DC
  Administered 2020-02-22: 0.4 mg via ORAL
  Filled 2020-02-21: qty 1

## 2020-02-21 MED ORDER — LEVOTHYROXINE SODIUM 25 MCG PO TABS
25.0000 ug | ORAL_TABLET | Freq: Every day | ORAL | Status: DC
  Filled 2020-02-21 (×2): qty 1

## 2020-02-21 MED ORDER — ONDANSETRON HCL 4 MG/2ML IJ SOLN: 4.0000 mg | Freq: Four times a day (QID) | INTRAMUSCULAR | Status: DC | PRN

## 2020-02-21 MED ORDER — VITAMIN B-12 1000 MCG PO TABS
1000.0000 ug | ORAL_TABLET | Freq: Every day | ORAL | Status: DC
  Administered 2020-02-22: 1000 ug via ORAL
  Filled 2020-02-21: qty 1

## 2020-02-21 MED ORDER — ACETAMINOPHEN 650 MG RE SUPP: 650.0000 mg | Freq: Four times a day (QID) | RECTAL | Status: DC | PRN

## 2020-02-21 MED ORDER — SODIUM CHLORIDE 0.9 % IV SOLN
2.0000 g | INTRAVENOUS | Status: DC
  Administered 2020-02-21: 2 g via INTRAVENOUS
  Filled 2020-02-21: qty 20

## 2020-02-21 MED ORDER — FERROUS SULFATE 325 (65 FE) MG PO TABS
325.0000 mg | ORAL_TABLET | Freq: Every day | ORAL | Status: DC
  Administered 2020-02-22: 325 mg via ORAL
  Filled 2020-02-21: qty 1

## 2020-02-21 MED ORDER — CEFAZOLIN SODIUM-DEXTROSE 1-4 GM/50ML-% IV SOLN
1.0000 g | Freq: Three times a day (TID) | INTRAVENOUS | Status: DC
  Filled 2020-02-21 (×3): qty 50

## 2020-02-21 MED ORDER — CO Q-10 VITAMIN E FISH OIL 60-90-25-200 PO CAPS: 1.0000 | ORAL_CAPSULE | Freq: Every day | ORAL | Status: DC

## 2020-02-21 NOTE — ED Notes (Signed)
Pt still in CT

## 2020-02-21 NOTE — ED Triage Notes (Signed)
Pt to triage via w/c with no distress noted; pt reports having N/V since yesterday; currently being tx for cellulitis of left ankle (chronic reoccuring wound); began augmentin 1/19 by PCP
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
## 2020-02-21 NOTE — ED Notes (Signed)
Attempted to obtain UA from patient without success. Pt given call bell and instructed to press when he needs to urinate.
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
## 2020-02-21 NOTE — Plan of Care (Signed)
Continuing with plan of care. 

## 2020-02-21 NOTE — ED Notes (Signed)
Using pen and paper to communicate pt reports that he has been having issues with his left foot with cellulitis and since yesterday he has been having some nausea with vomiting. Pt denies pain.

## 2020-02-21 NOTE — Progress Notes (Signed)

## 2020-02-22 ENCOUNTER — Observation Stay: Payer: Medicare Other

## 2020-02-22 DIAGNOSIS — L97329 Non-pressure chronic ulcer of left ankle with unspecified severity: Secondary | ICD-10-CM

## 2020-02-22 DIAGNOSIS — I83023 Varicose veins of left lower extremity with ulcer of ankle: Secondary | ICD-10-CM | POA: Diagnosis not present

## 2020-02-22 DIAGNOSIS — L97321 Non-pressure chronic ulcer of left ankle limited to breakdown of skin: Secondary | ICD-10-CM | POA: Diagnosis not present

## 2020-02-22 DIAGNOSIS — R112 Nausea with vomiting, unspecified: Secondary | ICD-10-CM | POA: Diagnosis not present

## 2020-02-22 LAB — BASIC METABOLIC PANEL
Anion gap: 8 (ref 5–15)
BUN: 12 mg/dL (ref 8–23)
CO2: 30 mmol/L (ref 22–32)
Calcium: 8.6 mg/dL — ABNORMAL LOW (ref 8.9–10.3)
Chloride: 102 mmol/L (ref 98–111)
Creatinine, Ser: 0.62 mg/dL (ref 0.61–1.24)
GFR, Estimated: >60 mL/min (ref >60)
Glucose, Bld: 86 mg/dL (ref 70–99)
Potassium: 3.8 mmol/L (ref 3.5–5.1)
Sodium: 140 mmol/L (ref 135–145)

## 2020-02-22 LAB — CBC
HCT: 28.5 % — ABNORMAL LOW (ref 39.0–52.0)
Hemoglobin: 9.5 g/dL — ABNORMAL LOW (ref 13.0–17.0)
MCH: 33.1 pg (ref 26.0–34.0)
MCHC: 33.3 g/dL (ref 30.0–36.0)
MCV: 99.3 fL (ref 80.0–100.0)
Platelets: 201 10*3/uL (ref 150–400)
RBC: 2.87 MIL/uL — ABNORMAL LOW (ref 4.22–5.81)
RDW: 15.9 % — ABNORMAL HIGH (ref 11.5–15.5)
WBC: 5.9 10*3/uL (ref 4.0–10.5)
nRBC: 0 % (ref 0.0–0.2)

## 2020-02-22 LAB — HEPATIC FUNCTION PANEL
ALT: 153 U/L — ABNORMAL HIGH (ref 0–44)
AST: 117 U/L — ABNORMAL HIGH (ref 15–41)
Albumin: 3.1 g/dL — ABNORMAL LOW (ref 3.5–5.0)
Alkaline Phosphatase: 159 U/L — ABNORMAL HIGH (ref 38–126)
Bilirubin, Direct: 0.1 mg/dL (ref 0.0–0.2)
Indirect Bilirubin: 0.7 mg/dL (ref 0.3–0.9)
Total Bilirubin: 0.8 mg/dL (ref 0.3–1.2)
Total Protein: 5.9 g/dL — ABNORMAL LOW (ref 6.5–8.1)

## 2020-02-22 MED ORDER — CEFDINIR 300 MG PO CAPS
600.0000 mg | ORAL_CAPSULE | ORAL | Status: AC
  Administered 2020-02-22: 600 mg via ORAL
  Filled 2020-02-22: qty 2

## 2020-02-22 MED ORDER — CEFDINIR 300 MG PO CAPS: 300.0000 mg | ORAL_CAPSULE | Freq: Two times a day (BID) | ORAL | 0 refills | Status: AC
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
## 2020-02-22 NOTE — Plan of Care (Signed)
Continuing with plan of care. 
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
## 2020-02-22 NOTE — Care Management CC44 (Addendum)
Condition Code 44 Documentation Completed  Patient Details  Name: Ronald Huerta MRN: [id number] Date of Birth: December 24, 1934   Condition Code 44 given:   (unable to complete due to patient and his son being deaf and unable to give in person due to weather) Patient signature on Condition Code 44 notice:    Documentation of 2 MD's agreement:    Code 44 added to claim:     CSW attempted to call patient's son to inform of code 15. Nurse informed that patient's son is also deaf. CSW not being able to complete in person due to weather and work from home. CSW attempted to call patient's other son, Ronald Huerta, but unable to get ahold of him.   Delphia Grates, LCSW 02/22/2020, 3:24 PM

## 2020-02-22 NOTE — Plan of Care (Signed)
Pt Axox4. Pt death and mute. Pt knowledgeable of sign language. This Nurse used gestures and writings to effectively communicate with pt. Denied pain all night.  Problem: Education: Goal: Knowledge of General Education information will improve Description: Including pain rating scale, medication(s)/side effects and non-pharmacologic comfort measures Outcome: Progressing   Problem: Health Behavior/Discharge Planning: Goal: Ability to manage health-related needs will improve Outcome: Progressing   Problem: Clinical Measurements: Goal: Ability to maintain clinical measurements within normal limits will improve Outcome: Progressing Goal: Will remain free from infection Outcome: Progressing Goal: Diagnostic test results will improve Outcome: Progressing Goal: Respiratory complications will improve Outcome: Progressing Goal: Cardiovascular complication will be avoided Outcome: Progressing   Problem: Activity: Goal: Risk for activity intolerance will decrease Outcome: Progressing   Problem: Nutrition: Goal: Adequate nutrition will be maintained Outcome: Progressing   Problem: Coping: Goal: Level of anxiety will decrease Outcome: Progressing   Problem: Elimination: Goal: Will not experience complications related to bowel motility Outcome: Progressing Goal: Will not experience complications related to urinary retention Outcome: Progressing   Problem: Pain Managment: Goal: General experience of comfort will improve Outcome: Progressing   Problem: Safety: Goal: Ability to remain free from injury will improve Outcome: Progressing   Problem: Skin Integrity: Goal: Risk for impaired skin integrity will decrease Outcome: Progressing

## 2020-02-22 NOTE — Plan of Care (Signed)
Discharge teaching with completed with patient and son, Ronald Huerta, utilizing hospital interpreter device.  Dressing teaching completed with son and patient, discharge teaching reviewed as well.  Patient is in stable condition.

## 2020-02-23 NOTE — Discharge Summary (Signed)
Physician Discharge Summary   Ronald Huerta [employer] ZSW:[id number] DOB: 02-09-1934 DOA: 02/21/2020  PCP: Barbette Reichmann, MD  Admit date: 02/21/2020 Discharge date: 02/23/2020  Admitted From: home Disposition:  home Discharging physician: Lewie Chamber, MD  Recommendations for Outpatient Follow-up:  1. Follow  Up with wound care center 2. ABI of LLE normal 3. May need venous reflux duplex outpatient per vascular surgery 4. Repeat LFTs. U/k etiology (possibly drug induced from recent abx) for elevation but downtrending by discharge    Patient discharged to home in Discharge Condition: stable CODE STATUS: Full Diet recommendation:  Diet Orders (From admission, onward)    Start     Ordered   02/22/20 0000  Diet - low sodium heart healthy        02/22/20 1217          Hospital Course:  Ronald Huerta is a 86 y.o. male with PMH significant for hypothyroidism, deafness, anxiety, BPH, chronic left lower extremity wound and GERD who presented to the emergency room for evaluation of a 1 day history of nausea and vomiting. Patient states that over the last 24 hours he has had multiple episodes of emesis and has remained nauseous but he denies having any abdominal pain or any changes in his bowel habits. Denies having any fever or chills. Patient was recently started on oral antibiotics for left leg cellulitis and states that there had not been any improvement in his cellulitis. He has not been able to take his oral medicines due to nausea and vomiting. He denies having any chest pain, no cough, no fever, no chills, no abdominal pain, no urinary frequency, no nocturia, no dysuria, no diaphoresis, no palpitations, no dizziness or lightheadedness. LFTs were elevated for u/k etiology but downtrended prior to discharge.  Abdominal ultrasound shows mild intrahepatic biliary dilatation post cholecystectomy, though CBD is normal in caliber.Remainder of exam unremarkable. CT scan of  abdomen and pelvis shows mild prostatic enlargement. No acute abnormality seen in the abdomen or pelvis. Aortic Atherosclerosis.  Left ankle x-ray shows soft tissue swelling. No soft tissue emphysema, bony erosion, or opaque foreign body. Left lower extremity ultrasound no evidence of DVT in the left lower extremity.  Patient was also evaluated by podiatry and vascular surgery.  His wound was felt to be more consistent with a chronic venous stasis ulcer.  He underwent arterial ABI which was normal.  Per vascular surgery, he was recommended for outpatient follow-up as he may need venous reflux duplex. He was discharged with [facility].  Prior to this, he underwent a wound culture obtained by podiatry.  After discharge wound culture began growing staph aureus.  If culture further matures to MRSA, patient will be contacted to change antibiotic for coverage.   The patient's chronic medical conditions were treated accordingly per the patient's home medication regimen except as noted.  On day of discharge, patient was felt deemed stable for discharge. Patient/family member advised to call PCP or come back to ER if needed.   Principal Diagnosis: Venous stasis ulcer (HCC)  Discharge Diagnoses: Active Hospital Problems   Diagnosis Date Noted  . Venous stasis ulcer (HCC) 04/03/2015    Priority: High  . Transaminitis 02/21/2020  . Acute gastritis 02/21/2020  . BPH with obstruction/lower urinary tract symptoms 02/10/2015  . Acquired hypothyroidism 04/01/2014  . Deaf, nonspeaking 04/01/2014    Resolved Hospital Problems  No resolved problems to display.    Discharge Instructions    Diet - low sodium heart healthy   Complete  by: As directed    Discharge wound care:   Complete by: As directed    Apply xeroform to wound daily and cover with dressing.   Increase activity slowly   Complete by: As directed      Allergies as of 02/22/2020   No Known Allergies     Medication List    STOP taking  these medications   pantoprazole 40 MG tablet Commonly known as: PROTONIX     TAKE these medications   Biotin 1 MG Caps Take by mouth.   cefdinir 300 MG capsule Commonly known as: OMNICEF Take 1 capsule (300 mg total) by mouth 2 (two) times daily for 5 days.   Co Q-10 Vitamin E Fish Oil 60-90-25-200 Caps Take by mouth.   ferrous sulfate 325 (65 FE) MG tablet Take 1 tablet (325 mg total) by mouth daily with breakfast.   finasteride 5 MG tablet Commonly known as: PROSCAR Take 1 tablet (5 mg total) by mouth daily.   FISH OIL PO Take by mouth.   folic acid 400 MCG tablet Commonly known as: FOLVITE Take 1 tablet (400 mcg total) by mouth daily.   gabapentin 100 MG capsule Commonly known as: NEURONTIN take 1 capsule by mouth three times a day   levothyroxine 25 MCG tablet Commonly known as: SYNTHROID Take 25 mcg by mouth daily before breakfast.   Multi-Vitamins Tabs Take by mouth.   omeprazole 20 MG capsule Commonly known as: PriLOSEC Take 1 capsule (20 mg total) by mouth daily.   tamsulosin 0.4 MG Caps capsule Commonly known as: FLOMAX Take 1 capsule (0.4 mg total) by mouth daily.   terazosin 5 MG capsule Commonly known as: HYTRIN Take 1 capsule (5 mg total) by mouth daily.   traMADol 50 MG tablet Commonly known as: ULTRAM Take 1 tablet (50 mg total) by mouth every 4 (four) hours as needed.   vitamin B-12 1000 MCG tablet Commonly known as: CYANOCOBALAMIN Take 1 tablet (1,000 mcg total) by mouth daily.            Discharge Care Instructions  (From admission, onward)         Start     Ordered   02/22/20 0000  Discharge wound care:       Comments: Apply xeroform to wound daily and cover with dressing.   02/22/20 1217          Follow-up Information    [facility]. Schedule an appointment as soon as possible for a visit in 1 week(s).   Specialty: Wound Care Why: Make appointment for left medial ankle  wound Contact information: [address] [phone] ar [phone]             No Known Allergies  Consultations: Podiatry Vascular surgery  Discharge Exam: BP (!) 104/58 (BP Location: Left Arm)   Pulse 85   Temp 98.1 F (36.7 C) (Oral)   Resp 18   Ht 5\' 7"  (1.702 m)   Wt 72.1 kg   SpO2 97%   BMI 24.90 kg/m  General appearance: alert, cooperative, no distress and Patient deaf and sign language interpreter used Head: Normocephalic, without obvious abnormality, atraumatic Eyes: EOMI Lungs: clear to auscultation bilaterally Heart: regular rate and rhythm and S1, S2 normal Abdomen: normal findings: bowel sounds normal and soft, non-tender Extremities: left ankle noted with ulcerated skin appears superficial, no purulent drainage or weeping, no erythma surrounding Skin: mobility and turgor normal Neurologic: deafness noted, otherwise no focal  deficits  The results of significant diagnostics from this hospitalization (including imaging, microbiology, ancillary and laboratory) are listed below for reference.   Microbiology: Recent Results (from the past 240 hour(s))  Blood culture (routine x 2)     Status: None (Preliminary result)   Collection Time: 02/21/20  5:26 AM   Specimen: BLOOD  Result Value Ref Range Status   Specimen Description BLOOD RIGHT AC  Final   Special Requests   Final    BOTTLES DRAWN AEROBIC AND ANAEROBIC Blood Culture results may not be optimal due to an excessive volume of blood received in culture bottles   Culture   Final    NO GROWTH 2 DAYS Performed at [facility], [address] [place], [place] [address]    Report Status PENDING  Incomplete  Blood culture (routine x 2)     Status: None (Preliminary result)   Collection Time: 02/21/20 11:38 AM   Specimen: BLOOD  Result Value Ref Range Status   Specimen Description [facility]LOOD RIGHT [facility]  Final   Special Requests   Final    BOTTLES DRAWN AEROBIC AND ANAEROBIC Blood Culture  adequate volume   Culture   Final    NO GROWTH 2 DAYS Performed at [facility], [address] [place], [place] [address]    Report Status PENDING  Incomplete  Aerobic/Anaerobic Culture (surgical/deep wound)     Status: None (Preliminary result)   Collection Time: 02/22/20  3:30 PM   Specimen: Wound  Result Value Ref Range Status   Specimen Description   Final    WOUND Performed at [facility], [address] [place], [place] [address]    Special Requests   Final    NONE Performed at [facility], [address] [place], [place] [address]    Gram Stain   Final    RARE WBC PRESENT, PREDOMINANTLY PMN RARE GRAM POSITIVE COCCI IN PAIRS    Culture   Final    FEW STAPHYLOCOCCUS AUREUS SUSCEPTIBILITIES TO FOLLOW Performed at [facility], [address] [place], [place] [address]    Report Status PENDING  Incomplete     Labs: BNP (last 3 results) No results for input(s): BNP in the last 8760 hours. Basic Metabolic Panel: Recent Labs  Lab 02/21/20 0526 02/22/20 0527  NA 139 140  K 4.0 3.8  CL 100 102  CO2 26 30  GLUCOSE 131* 86  BUN 16 12  CREATININE 0.71 0.62  CALCIUM 9.3 8.6*   Liver Function Tests: Recent Labs  Lab 02/21/20 0526 02/22/20 0527  AST 277* 117*  ALT 261* 153*  ALKPHOS 241* 159*  BILITOT 1.3* 0.8  PROT 7.6 5.9*  ALBUMIN 4.0 3.1*   Recent Labs  Lab 02/21/20 0526  LIPASE 52*   No results for input(s): AMMONIA in the last 168 hours. CBC: Recent Labs  Lab 02/21/20 0526 02/22/20 0527  WBC 8.0 5.9  NEUTROABS 6.6  --   HGB 11.5* 9.5*  HCT 35.2* 28.5*  MCV 100.6* 99.3  PLT 233 201   Cardiac Enzymes: No results for input(s): CKTOTAL, CKMB, CKMBINDEX, TROPONINI in the last 168 hours. BNP: Invalid input(s): POCBNP CBG: No results for input(s): GLUCAP in the last 168 hours. D-Dimer No results for input(s): DDIMER in the last 72 hours. Hgb A1c No results for input(s): HGBA1C in the last 72  hours. Lipid Profile No results for input(s): CHOL, HDL, LDLCALC, TRIG, CHOLHDL, LDLDIRECT in the last 72 hours. Thyroid function  studies No results for input(s): TSH, T4TOTAL, T3FREE, THYROIDAB in the last 72 hours.  Invalid input(s): FREET3 Anemia work up No results for input(s): VITAMINB12, FOLATE, FERRITIN, TIBC, IRON, RETICCTPCT in the last 72 hours. Urinalysis    Component Value Date/Time   COLORURINE YELLOW (A) 02/06/2018 0917   APPEARANCEUR CLOUDY (A) 02/06/2018 0917   APPEARANCEUR Clear 12/14/2013 2131   LABSPEC 1.016 02/06/2018 0917   LABSPEC 1.025 12/14/2013 2131   PHURINE 6.0 02/06/2018 0917   GLUCOSEU NEGATIVE 02/06/2018 0917   GLUCOSEU Negative 12/14/2013 2131   HGBUR NEGATIVE 02/06/2018 0917   BILIRUBINUR NEGATIVE 02/06/2018 0917   BILIRUBINUR Negative 12/14/2013 2131   KETONESUR NEGATIVE 02/06/2018 0917   PROTEINUR NEGATIVE 02/06/2018 0917   NITRITE NEGATIVE 02/06/2018 0917   LEUKOCYTESUR NEGATIVE 02/06/2018 0917   LEUKOCYTESUR Negative 12/14/2013 2131   Sepsis Labs Invalid input(s): PROCALCITONIN,  WBC,  LACTICIDVEN Microbiology Recent Results (from the past 240 hour(s))  Blood culture (routine x 2)     Status: None (Preliminary result)   Collection Time: 02/21/20  5:26 AM   Specimen: BLOOD  Result Value Ref Range Status   Specimen Description BLOOD RIGHT AC  Final   Special Requests   Final    BOTTLES DRAWN AEROBIC AND ANAEROBIC Blood Culture results may not be optimal due to an excessive volume of blood received in culture bottles   Culture   Final    NO GROWTH 2 DAYS Performed at [facility], [address] [place], [place] [address]    Report Status PENDING  Incomplete  Blood culture (routine x 2)     Status: None (Preliminary result)   Collection Time: 02/21/20 11:38 AM   Specimen: BLOOD  Result Value Ref Range Status   Specimen Description BLOOD RIGHT [facility]  Final   Special Requests   Final    BOTTLES DRAWN AEROBIC AND ANAEROBIC Blood  Culture adequate volume   Culture   Final    NO GROWTH 2 DAYS Performed at [facility], [address] [place], [place] [address]    Report Status PENDING  Incomplete  Aerobic/Anaerobic Culture (surgical/deep wound)     Status: None (Preliminary result)   Collection Time: 02/22/20  3:30 PM   Specimen: Wound  Result Value Ref Range Status   Specimen Description   Final    WOUND Performed at [facility], [address] [place], [place] [address]    Special Requests   Final    NONE Performed at [facility], [address] [place], [place] [address]    Gram Stain   Final    RARE WBC PRESENT, PREDOMINANTLY PMN RARE GRAM POSITIVE COCCI IN PAIRS    Culture   Final    FEW STAPHYLOCOCCUS AUREUS SUSCEPTIBILITIES TO FOLLOW Performed at [facility], [address], [place], [place] [address]    Report Status PENDING  Incomplete    Procedures/Studies: DG Ankle 2 Views Left  Result Date: 02/21/2020 CLINICAL DATA:  Left ankle cellulitis EXAM: LEFT ANKLE - 2 VIEW COMPARISON:  None. FINDINGS: Subcutaneous reticulation and skin thickening correlating with the history. There are dystrophic calcifications in the lower medial leg. No soft tissue emphysema or opaque foreign body. No ankle joint effusion. Generalized osteopenia. IMPRESSION: Soft tissue swelling correlating with history of cellulitis. No soft tissue emphysema, bony erosion, or opaque foreign body. Electronically Signed   By: Marnee Spring M.D.   On: 02/21/2020 10:33   DG Ankle Complete Left  Result Date:  02/12/2020 CLINICAL DATA:  86 year old male with left ankle swelling. EXAM: LEFT ANKLE COMPLETE - 3+ VIEW COMPARISON:  None. FINDINGS: There is no acute fracture or dislocation. The bones are osteopenic. The ankle mortise is intact. There is mild soft tissue edema. No radiopaque foreign object or soft tissue gas. IMPRESSION: No acute fracture or dislocation. Electronically Signed   By: Elgie CollardArash   Radparvar M.D.   On: 02/12/2020 22:30   CT Abdomen Pelvis W Contrast  Result Date: 02/21/2020 CLINICAL DATA:  Nausea, vomiting. EXAM: CT ABDOMEN AND PELVIS WITH CONTRAST TECHNIQUE: Multidetector CT imaging of the abdomen and pelvis was performed using the standard protocol following bolus administration of intravenous contrast. CONTRAST:  100mL OMNIPAQUE IOHEXOL 300 MG/ML  SOLN COMPARISON:  January 06, 2016. FINDINGS: Lower chest: No acute abnormality. Hepatobiliary: Status post cholecystectomy. Mild intrahepatic and extrahepatic biliary dilatation is noted most likely due to post cholecystectomy status. No focal hepatic abnormality is noted. Pancreas: Unremarkable. No pancreatic ductal dilatation or surrounding inflammatory changes. Spleen: Normal in size without focal abnormality. Adrenals/Urinary Tract: Adrenal glands are unremarkable. Kidneys are normal, without renal calculi, focal lesion, or hydronephrosis. Bladder is unremarkable. Stomach/Bowel: Stomach is within normal limits. Appendix appears normal. No evidence of bowel wall thickening, distention, or inflammatory changes. Vascular/Lymphatic: Aortic atherosclerosis. No enlarged abdominal or pelvic lymph nodes. Reproductive: Mild prostatic enlargement is noted. Other: No abdominal wall hernia or abnormality. No abdominopelvic ascites. Musculoskeletal: No acute or significant osseous findings. IMPRESSION: Mild prostatic enlargement. No acute abnormality seen in the abdomen or pelvis. Aortic Atherosclerosis (ICD10-I70.0). Electronically Signed   By: Lupita RaiderJames  Green Jr M.D.   On: 02/21/2020 08:53   US Venous Img Lower Unilateral Left  Result Date: 02/21/2020 CLINICAL DATA:  Left lower extremity swelling for 1 day. EXAM: LEFT LOWER EXTREMITY VENOUS DOPPLER ULTRASOUND TECHNIQUE: Gray-scale sonography with compression, as well as color and duplex ultrasound, were performed to evaluate the deep venous system(s) from the level of the common femoral vein  through the popliteal and proximal calf veins. COMPARISON:  03/24/2015 left lower extremity venous Doppler scan. FINDINGS: VENOUS Normal compressibility of the common femoral, superficial femoral, and popliteal veins, as well as the visualized calf veins. Visualized portions of profunda femoral vein and great saphenous vein unremarkable. No filling defects to suggest DVT on grayscale or color Doppler imaging. Doppler waveforms show normal direction of venous flow, normal respiratory plasticity and response to augmentation. Limited views of the contralateral common femoral vein are unremarkable. OTHER None. Limitations: none IMPRESSION: No evidence of deep venous thrombosis in the left lower extremity. Electronically Signed   By: Delbert PhenixJason A Poff M.D.   On: 02/21/2020 09:14   US ARTERIAL ABI (SCREENING LOWER EXTREMITY)  Result Date: 02/23/2020 CLINICAL DATA:  Left ankle wound. EXAM: NONINVASIVE PHYSIOLOGIC VASCULAR STUDY OF BILATERAL LOWER EXTREMITIES TECHNIQUE: Evaluation of both lower extremities were performed at rest, including calculation of ankle-brachial indices with single level Doppler, pressure and pulse volume recording. COMPARISON:  None. FINDINGS: Right ABI:  1.12 Left ABI:  1.08 Right Lower Extremity: Normal triphasic arterial waveforms at the ankle. Left Lower Extremity: Triphasic dorsalis pedis waveform. The posterior tibial waveform could not be obtained due to active wound over the medial aspect of the left ankle. 1.0-1.4 Normal IMPRESSION: Normal resting ankle-brachial indices bilaterally and normal distal waveforms. Electronically Signed   By: Irish LackGlenn  Yamagata M.D.   On: 02/23/2020 08:11   US Abdomen Limited RUQ (LIVER/GB)  Result Date: 02/21/2020 CLINICAL DATA:  Elevated LFTs, history GERD, irritable bowel syndrome EXAM:  ULTRASOUND ABDOMEN LIMITED RIGHT UPPER QUADRANT COMPARISON:  CT abdomen and pelvis 02/21/2020 FINDINGS: Gallbladder: Surgically absent Common bile duct: Diameter: 6 mm  diameter, normal post cholecystectomy Liver: Normal echogenicity without mass or nodularity. Mild intrahepatic biliary dilatation identified. Portal vein is patent on color Doppler imaging with normal direction of blood flow towards the liver. Other: No RIGHT upper quadrant free fluid. IMPRESSION: Mild intrahepatic biliary dilatation post cholecystectomy, though CBD is normal in caliber. Remainder of exam unremarkable. Electronically Signed   By: Ulyses Southward M.D.   On: 02/21/2020 10:06     Time coordinating discharge: Over 30 minutes    Lewie Chamber, MD  [facility] 02/23/2020, 3:21 PM

## 2020-02-26 LAB — CULTURE, BLOOD (ROUTINE X 2)
Culture: NO GROWTH
Culture: NO GROWTH
Special Requests: ADEQUATE

## 2020-02-27 LAB — AEROBIC/ANAEROBIC CULTURE W GRAM STAIN (SURGICAL/DEEP WOUND)

## 2020-07-05 ENCOUNTER — Inpatient Hospital Stay: Payer: Medicare Other

## 2020-07-05 ENCOUNTER — Emergency Department: Payer: Medicare Other

## 2020-07-05 ENCOUNTER — Inpatient Hospital Stay
Admission: EM | Admit: 2020-07-05 | Discharge: 2020-07-15 | DRG: 871 | Disposition: A | Discharge status: Home Health | Payer: Medicare Other | Attending: Internal Medicine | Admitting: Internal Medicine

## 2020-07-05 ENCOUNTER — Other Ambulatory Visit: Payer: Self-pay

## 2020-07-05 ENCOUNTER — Encounter: Payer: Self-pay | Admitting: Radiology

## 2020-07-05 DIAGNOSIS — R109 Unspecified abdominal pain: Secondary | ICD-10-CM | POA: Diagnosis not present

## 2020-07-05 DIAGNOSIS — Z8614 Personal history of Methicillin resistant Staphylococcus aureus infection: Secondary | ICD-10-CM

## 2020-07-05 DIAGNOSIS — A419 Sepsis, unspecified organism: Principal | ICD-10-CM | POA: Diagnosis present

## 2020-07-05 DIAGNOSIS — D638 Anemia in other chronic diseases classified elsewhere: Secondary | ICD-10-CM | POA: Diagnosis present

## 2020-07-05 DIAGNOSIS — E785 Hyperlipidemia, unspecified: Secondary | ICD-10-CM | POA: Diagnosis present

## 2020-07-05 DIAGNOSIS — L97329 Non-pressure chronic ulcer of left ankle with unspecified severity: Secondary | ICD-10-CM | POA: Diagnosis present

## 2020-07-05 DIAGNOSIS — I878 Other specified disorders of veins: Secondary | ICD-10-CM | POA: Diagnosis present

## 2020-07-05 DIAGNOSIS — E871 Hypo-osmolality and hyponatremia: Secondary | ICD-10-CM | POA: Diagnosis present

## 2020-07-05 DIAGNOSIS — G2581 Restless legs syndrome: Secondary | ICD-10-CM | POA: Diagnosis present

## 2020-07-05 DIAGNOSIS — Z96641 Presence of right artificial hip joint: Secondary | ICD-10-CM | POA: Diagnosis present

## 2020-07-05 DIAGNOSIS — E872 Acidosis: Secondary | ICD-10-CM | POA: Diagnosis present

## 2020-07-05 DIAGNOSIS — K580 Irritable bowel syndrome with diarrhea: Secondary | ICD-10-CM | POA: Diagnosis present

## 2020-07-05 DIAGNOSIS — E876 Hypokalemia: Secondary | ICD-10-CM | POA: Diagnosis present

## 2020-07-05 DIAGNOSIS — T148XXA Other injury of unspecified body region, initial encounter: Secondary | ICD-10-CM | POA: Diagnosis not present

## 2020-07-05 DIAGNOSIS — G9341 Metabolic encephalopathy: Secondary | ICD-10-CM | POA: Diagnosis present

## 2020-07-05 DIAGNOSIS — R338 Other retention of urine: Secondary | ICD-10-CM | POA: Diagnosis present

## 2020-07-05 DIAGNOSIS — L089 Local infection of the skin and subcutaneous tissue, unspecified: Secondary | ICD-10-CM | POA: Diagnosis present

## 2020-07-05 DIAGNOSIS — L03116 Cellulitis of left lower limb: Secondary | ICD-10-CM | POA: Diagnosis present

## 2020-07-05 DIAGNOSIS — R531 Weakness: Secondary | ICD-10-CM | POA: Diagnosis present

## 2020-07-05 DIAGNOSIS — N138 Other obstructive and reflux uropathy: Secondary | ICD-10-CM | POA: Diagnosis present

## 2020-07-05 DIAGNOSIS — E039 Hypothyroidism, unspecified: Secondary | ICD-10-CM | POA: Diagnosis present

## 2020-07-05 DIAGNOSIS — D72829 Elevated white blood cell count, unspecified: Secondary | ICD-10-CM | POA: Diagnosis not present

## 2020-07-05 DIAGNOSIS — G629 Polyneuropathy, unspecified: Secondary | ICD-10-CM | POA: Diagnosis present

## 2020-07-05 DIAGNOSIS — Z20822 Contact with and (suspected) exposure to covid-19: Secondary | ICD-10-CM | POA: Diagnosis present

## 2020-07-05 DIAGNOSIS — N401 Enlarged prostate with lower urinary tract symptoms: Secondary | ICD-10-CM | POA: Diagnosis present

## 2020-07-05 DIAGNOSIS — Z23 Encounter for immunization: Secondary | ICD-10-CM | POA: Diagnosis present

## 2020-07-05 DIAGNOSIS — B965 Pseudomonas (aeruginosa) (mallei) (pseudomallei) as the cause of diseases classified elsewhere: Secondary | ICD-10-CM | POA: Diagnosis present

## 2020-07-05 DIAGNOSIS — I83009 Varicose veins of unspecified lower extremity with ulcer of unspecified site: Secondary | ICD-10-CM | POA: Diagnosis not present

## 2020-07-05 DIAGNOSIS — L97909 Non-pressure chronic ulcer of unspecified part of unspecified lower leg with unspecified severity: Secondary | ICD-10-CM | POA: Diagnosis not present

## 2020-07-05 DIAGNOSIS — K219 Gastro-esophageal reflux disease without esophagitis: Secondary | ICD-10-CM | POA: Diagnosis present

## 2020-07-05 DIAGNOSIS — K5900 Constipation, unspecified: Secondary | ICD-10-CM | POA: Diagnosis not present

## 2020-07-05 DIAGNOSIS — F419 Anxiety disorder, unspecified: Secondary | ICD-10-CM | POA: Diagnosis present

## 2020-07-05 DIAGNOSIS — I83023 Varicose veins of left lower extremity with ulcer of ankle: Secondary | ICD-10-CM | POA: Diagnosis present

## 2020-07-05 DIAGNOSIS — R748 Abnormal levels of other serum enzymes: Secondary | ICD-10-CM | POA: Diagnosis present

## 2020-07-05 DIAGNOSIS — Z87442 Personal history of urinary calculi: Secondary | ICD-10-CM

## 2020-07-05 DIAGNOSIS — H913 Deaf nonspeaking, not elsewhere classified: Secondary | ICD-10-CM | POA: Diagnosis present

## 2020-07-05 DIAGNOSIS — I4891 Unspecified atrial fibrillation: Secondary | ICD-10-CM | POA: Diagnosis present

## 2020-07-05 DIAGNOSIS — Z79899 Other long term (current) drug therapy: Secondary | ICD-10-CM

## 2020-07-05 DIAGNOSIS — Z9049 Acquired absence of other specified parts of digestive tract: Secondary | ICD-10-CM

## 2020-07-05 DIAGNOSIS — Z79890 Hormone replacement therapy (postmenopausal): Secondary | ICD-10-CM

## 2020-07-05 DIAGNOSIS — R0789 Other chest pain: Secondary | ICD-10-CM | POA: Diagnosis present

## 2020-07-05 DIAGNOSIS — I480 Paroxysmal atrial fibrillation: Secondary | ICD-10-CM | POA: Diagnosis not present

## 2020-07-05 LAB — URINALYSIS, COMPLETE (UACMP) WITH MICROSCOPIC
Bacteria, UA: NONE SEEN
Bilirubin Urine: NEGATIVE
Glucose, UA: NEGATIVE mg/dL
Hgb urine dipstick: NEGATIVE
Ketones, ur: NEGATIVE mg/dL
Leukocytes,Ua: NEGATIVE
Nitrite: NEGATIVE
Protein, ur: NEGATIVE mg/dL
Specific Gravity, Urine: 1.014 (ref 1.005–1.030)
Squamous Epithelial / HPF: NONE SEEN (ref 0–5)
pH: 5 (ref 5.0–8.0)

## 2020-07-05 LAB — CBC WITH DIFFERENTIAL/PLATELET
Abs Immature Granulocytes: 0.04 10*3/uL (ref 0.00–0.07)
Basophils Absolute: 0 10*3/uL (ref 0.0–0.1)
Basophils Relative: 0 %
Eosinophils Absolute: 0.1 10*3/uL (ref 0.0–0.5)
Eosinophils Relative: 1 %
HCT: 31.4 % — ABNORMAL LOW (ref 39.0–52.0)
Hemoglobin: 10.6 g/dL — ABNORMAL LOW (ref 13.0–17.0)
Immature Granulocytes: 0 %
Lymphocytes Relative: 5 %
Lymphs Abs: 0.6 10*3/uL — ABNORMAL LOW (ref 0.7–4.0)
MCH: 33.3 pg (ref 26.0–34.0)
MCHC: 33.8 g/dL (ref 30.0–36.0)
MCV: 98.7 fL (ref 80.0–100.0)
Monocytes Absolute: 0.8 10*3/uL (ref 0.1–1.0)
Monocytes Relative: 7 %
Neutro Abs: 9.5 10*3/uL — ABNORMAL HIGH (ref 1.7–7.7)
Neutrophils Relative %: 87 %
Platelets: 179 10*3/uL (ref 150–400)
RBC: 3.18 MIL/uL — ABNORMAL LOW (ref 4.22–5.81)
RDW: 15.6 % — ABNORMAL HIGH (ref 11.5–15.5)
WBC: 11 10*3/uL — ABNORMAL HIGH (ref 4.0–10.5)
nRBC: 0.2 % (ref 0.0–0.2)

## 2020-07-05 LAB — COMPREHENSIVE METABOLIC PANEL
ALT: 29 U/L (ref 0–44)
AST: 43 U/L — ABNORMAL HIGH (ref 15–41)
Albumin: 3.9 g/dL (ref 3.5–5.0)
Alkaline Phosphatase: 100 U/L (ref 38–126)
Anion gap: 8 (ref 5–15)
BUN: 21 mg/dL (ref 8–23)
CO2: 23 mmol/L (ref 22–32)
Calcium: 8.7 mg/dL — ABNORMAL LOW (ref 8.9–10.3)
Chloride: 102 mmol/L (ref 98–111)
Creatinine, Ser: 1.07 mg/dL (ref 0.61–1.24)
GFR, Estimated: >60 mL/min (ref >60)
Glucose, Bld: 105 mg/dL — ABNORMAL HIGH (ref 70–99)
Potassium: 4 mmol/L (ref 3.5–5.1)
Sodium: 133 mmol/L — ABNORMAL LOW (ref 135–145)
Total Bilirubin: 1.6 mg/dL — ABNORMAL HIGH (ref 0.3–1.2)
Total Protein: 7.1 g/dL (ref 6.5–8.1)

## 2020-07-05 LAB — RESP PANEL BY RT-PCR (FLU A&B, COVID) ARPGX2
Influenza A by PCR: NEGATIVE
Influenza B by PCR: NEGATIVE
SARS Coronavirus 2 by RT PCR: NEGATIVE

## 2020-07-05 LAB — TROPONIN I (HIGH SENSITIVITY): Troponin I (High Sensitivity): 11 ng/L (ref <18)

## 2020-07-05 LAB — PROCALCITONIN: Procalcitonin: 0.48 ng/mL

## 2020-07-05 LAB — LIPASE, BLOOD: Lipase: 46 U/L (ref 11–51)

## 2020-07-05 LAB — LACTIC ACID, PLASMA: Lactic Acid, Venous: 1.2 mmol/L (ref 0.5–1.9)

## 2020-07-05 MED ORDER — OMEGA-3-ACID ETHYL ESTERS 1 G PO CAPS
1.0000 g | ORAL_CAPSULE | Freq: Every day | ORAL | Status: DC
  Administered 2020-07-06 – 2020-07-15 (×10): 1 g via ORAL
  Filled 2020-07-05 (×10): qty 1

## 2020-07-05 MED ORDER — POLYETHYLENE GLYCOL 3350 17 G PO PACK: 17.0000 g | PACK | Freq: Every day | ORAL | Status: DC | PRN

## 2020-07-05 MED ORDER — FINASTERIDE 5 MG PO TABS
5.0000 mg | ORAL_TABLET | Freq: Every day | ORAL | Status: DC
  Administered 2020-07-06 – 2020-07-15 (×10): 5 mg via ORAL
  Filled 2020-07-05 (×10): qty 1

## 2020-07-05 MED ORDER — ACETAMINOPHEN 325 MG PO TABS
650.0000 mg | ORAL_TABLET | Freq: Four times a day (QID) | ORAL | Status: DC | PRN
  Administered 2020-07-09 – 2020-07-11 (×3): 650 mg via ORAL
  Filled 2020-07-05 (×3): qty 2

## 2020-07-05 MED ORDER — PANTOPRAZOLE SODIUM 40 MG PO TBEC
40.0000 mg | DELAYED_RELEASE_TABLET | Freq: Every day | ORAL | Status: DC
  Administered 2020-07-06 – 2020-07-15 (×10): 40 mg via ORAL
  Filled 2020-07-05 (×10): qty 1

## 2020-07-05 MED ORDER — IOHEXOL 9 MG/ML PO SOLN
500.0000 mL | ORAL | Status: AC
  Administered 2020-07-05 (×2): 500 mL via ORAL

## 2020-07-05 MED ORDER — FOLIC ACID 1 MG PO TABS
500.0000 ug | ORAL_TABLET | Freq: Every day | ORAL | Status: DC
  Administered 2020-07-06 – 2020-07-15 (×10): 0.5 mg via ORAL
  Filled 2020-07-05 (×10): qty 1

## 2020-07-05 MED ORDER — ONDANSETRON HCL 4 MG/2ML IJ SOLN
4.0000 mg | Freq: Four times a day (QID) | INTRAMUSCULAR | Status: DC | PRN
  Filled 2020-07-05: qty 2

## 2020-07-05 MED ORDER — VANCOMYCIN HCL 1250 MG/250ML IV SOLN
1250.0000 mg | INTRAVENOUS | Status: DC
  Administered 2020-07-06 – 2020-07-08 (×3): 1250 mg via INTRAVENOUS
  Filled 2020-07-05 (×5): qty 250

## 2020-07-05 MED ORDER — ONDANSETRON HCL 4 MG/2ML IJ SOLN
4.0000 mg | Freq: Once | INTRAMUSCULAR | Status: AC
  Administered 2020-07-05: 4 mg via INTRAVENOUS

## 2020-07-05 MED ORDER — SODIUM CHLORIDE 0.9 % IV SOLN
12.5000 mg | Freq: Four times a day (QID) | INTRAVENOUS | Status: DC | PRN
  Administered 2020-07-05 – 2020-07-08 (×2): 12.5 mg via INTRAVENOUS
  Filled 2020-07-05: qty 12.5
  Filled 2020-07-05: qty 0.5

## 2020-07-05 MED ORDER — FERROUS SULFATE 325 (65 FE) MG PO TABS
325.0000 mg | ORAL_TABLET | Freq: Every day | ORAL | Status: DC
  Administered 2020-07-06 – 2020-07-14 (×9): 325 mg via ORAL
  Filled 2020-07-05 (×9): qty 1

## 2020-07-05 MED ORDER — SODIUM CHLORIDE 0.9 % IV SOLN
2.0000 g | Freq: Once | INTRAVENOUS | Status: AC
  Administered 2020-07-05: 2 g via INTRAVENOUS
  Filled 2020-07-05: qty 20

## 2020-07-05 MED ORDER — ADULT MULTIVITAMIN W/MINERALS CH
1.0000 | ORAL_TABLET | Freq: Every day | ORAL | Status: DC
  Administered 2020-07-06 – 2020-07-15 (×10): 1 via ORAL
  Filled 2020-07-05 (×10): qty 1

## 2020-07-05 MED ORDER — VANCOMYCIN HCL 500 MG/100ML IV SOLN
500.0000 mg | Freq: Once | INTRAVENOUS | Status: AC
  Administered 2020-07-05: 500 mg via INTRAVENOUS
  Filled 2020-07-05: qty 100

## 2020-07-05 MED ORDER — SODIUM CHLORIDE 0.9 % IV SOLN
2.0000 g | INTRAVENOUS | Status: DC
  Administered 2020-07-06: 2 g via INTRAVENOUS
  Filled 2020-07-05 (×2): qty 20

## 2020-07-05 MED ORDER — GABAPENTIN 100 MG PO CAPS
100.0000 mg | ORAL_CAPSULE | Freq: Three times a day (TID) | ORAL | Status: DC
  Administered 2020-07-05 – 2020-07-15 (×29): 100 mg via ORAL
  Filled 2020-07-05 (×30): qty 1

## 2020-07-05 MED ORDER — SENNA 8.6 MG PO TABS
1.0000 | ORAL_TABLET | Freq: Every day | ORAL | Status: DC
  Administered 2020-07-06: 8.6 mg via ORAL
  Filled 2020-07-05: qty 1

## 2020-07-05 MED ORDER — OXYCODONE-ACETAMINOPHEN 5-325 MG PO TABS
1.0000 | ORAL_TABLET | ORAL | Status: DC | PRN
  Administered 2020-07-05 – 2020-07-07 (×3): 1 via ORAL
  Filled 2020-07-05 (×5): qty 1

## 2020-07-05 MED ORDER — HEPARIN SODIUM (PORCINE) 5000 UNIT/ML IJ SOLN
5000.0000 [IU] | Freq: Three times a day (TID) | INTRAMUSCULAR | Status: DC
  Administered 2020-07-05 – 2020-07-06 (×3): 5000 [IU] via SUBCUTANEOUS
  Filled 2020-07-05 (×2): qty 1

## 2020-07-05 MED ORDER — MORPHINE SULFATE (PF) 2 MG/ML IV SOLN
0.5000 mg | INTRAVENOUS | Status: DC | PRN
  Administered 2020-07-10: 10:00:00 0.5 mg via INTRAVENOUS
  Filled 2020-07-05: qty 1

## 2020-07-05 MED ORDER — TAMSULOSIN HCL 0.4 MG PO CAPS
0.4000 mg | ORAL_CAPSULE | Freq: Every day | ORAL | Status: DC
  Administered 2020-07-06 – 2020-07-15 (×10): 0.4 mg via ORAL
  Filled 2020-07-05 (×10): qty 1

## 2020-07-05 MED ORDER — ONDANSETRON HCL 4 MG/2ML IJ SOLN
4.0000 mg | Freq: Three times a day (TID) | INTRAMUSCULAR | Status: DC | PRN
  Administered 2020-07-05 (×2): 4 mg via INTRAVENOUS
  Filled 2020-07-05 (×3): qty 2

## 2020-07-05 MED ORDER — VANCOMYCIN HCL IN DEXTROSE 1-5 GM/200ML-% IV SOLN
1000.0000 mg | Freq: Once | INTRAVENOUS | Status: AC
  Administered 2020-07-05: 1000 mg via INTRAVENOUS
  Filled 2020-07-05: qty 200

## 2020-07-05 MED ORDER — VITAMIN B-12 1000 MCG PO TABS
1000.0000 ug | ORAL_TABLET | Freq: Every day | ORAL | Status: DC
  Administered 2020-07-06 – 2020-07-15 (×10): 1000 ug via ORAL
  Filled 2020-07-05 (×10): qty 1

## 2020-07-05 MED ORDER — MORPHINE SULFATE (PF) 2 MG/ML IV SOLN
1.0000 mg | Freq: Once | INTRAVENOUS | Status: AC
Start: 2020-07-05 — End: 2020-07-05
  Administered 2020-07-05: 1 mg via INTRAVENOUS
  Filled 2020-07-05: qty 1

## 2020-07-05 MED ORDER — LEVOTHYROXINE SODIUM 50 MCG PO TABS
25.0000 ug | ORAL_TABLET | Freq: Every day | ORAL | Status: DC
  Administered 2020-07-06 – 2020-07-15 (×10): 25 ug via ORAL
  Filled 2020-07-05 (×10): qty 1

## 2020-07-05 NOTE — ED Triage Notes (Signed)
Per EMS, pt reports generalized weakness and nausea. Pt currently being tx for infection to the left lower leg.
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
## 2020-07-05 NOTE — ED Notes (Signed)
Pt transported to MRI 

## 2020-07-05 NOTE — ED Notes (Signed)
Pt given food tray. ASL interpreter used to explain medication administration

## 2020-07-05 NOTE — ED Notes (Signed)
XR at bedside
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
## 2020-07-05 NOTE — ED Notes (Addendum)
Pt c/o burning/tinlging sensation in LLE. Pt stating sensation and pain is constant and has no relief. Pt described it as a shock. MD made aware.

## 2020-07-05 NOTE — ED Notes (Signed)
This RN at bedside to introduce self. Virtual ASL interpreter used.

## 2020-07-05 NOTE — ED Notes (Signed)
Pt actively vomiting. PRN medication administered.

## 2020-07-05 NOTE — Consult Note (Signed)
Pharmacy Antibiotic Note  Ronald Huerta is a 86 y.o. male admitted on 07/05/2020 with left leg wound infection.    Pharmacy has been consulted for vancomycin dosing.  Plan: Patient received 1000mg  IV vancomycin in ED - will IMMEDIATELY follow with 500mg  dose to finish 1500mg  loading dose  Will then start  Vancomycin 1250 mg IV Q 24 hrs.  Goal AUC 400-550. Expected AUC: 476 SCr used: 1.07   Height: 5\' 9"  (175.3 cm) Weight: 76.2 kg (168 lb) IBW/kg (Calculated) : 70.7  Temp (24hrs), Avg:100.3 F (37.9 C), Min:100.3 F (37.9 C), Max:100.3 F (37.9 C)  Recent Labs  Lab 07/05/20 0545  WBC 11.0*  CREATININE 1.07  LATICACIDVEN 1.2    Estimated Creatinine Clearance: 52.3 mL/min (by C-G formula based on SCr of 1.07 mg/dL).    No Known Allergies  Antimicrobials this admission: Vancomycin 6/5 >> Ceftriaxone 6/5 >>  Dose adjustments this admission: None  Microbiology results: 6/5 BCx: pending 6/5 WCx: pending  6/5 UCx: pending   Thank you for allowing pharmacy to be a part of this patient's care.  , PharmD, BCPS Clinical Pharmacist 07/05/2020 7:59 AM

## 2020-07-06 ENCOUNTER — Encounter: Payer: Self-pay | Admitting: Internal Medicine

## 2020-07-06 LAB — URINE CULTURE: Culture: NO GROWTH

## 2020-07-06 LAB — PROTIME-INR
INR: 1.1 (ref 0.8–1.2)
Prothrombin Time: 14.4 seconds (ref 11.4–15.2)

## 2020-07-06 LAB — APTT: aPTT: 38 seconds — ABNORMAL HIGH (ref 24–36)

## 2020-07-06 LAB — CBC
HCT: 29.2 % — ABNORMAL LOW (ref 39.0–52.0)
Hemoglobin: 10 g/dL — ABNORMAL LOW (ref 13.0–17.0)
MCH: 32.9 pg (ref 26.0–34.0)
MCHC: 34.2 g/dL (ref 30.0–36.0)
MCV: 96.1 fL (ref 80.0–100.0)
Platelets: 156 10*3/uL (ref 150–400)
RBC: 3.04 MIL/uL — ABNORMAL LOW (ref 4.22–5.81)
RDW: 15.2 % (ref 11.5–15.5)
WBC: 7.6 10*3/uL (ref 4.0–10.5)
nRBC: 0 % (ref 0.0–0.2)

## 2020-07-06 LAB — C-REACTIVE PROTEIN: CRP: 12.2 mg/dL — ABNORMAL HIGH (ref <1.0)

## 2020-07-06 LAB — CREATININE, SERUM
Creatinine, Ser: 1 mg/dL (ref 0.61–1.24)
GFR, Estimated: >60 mL/min (ref >60)

## 2020-07-06 LAB — SEDIMENTATION RATE: Sed Rate: 58 mm/hr — ABNORMAL HIGH (ref 0–20)

## 2020-07-06 MED ORDER — ENOXAPARIN SODIUM 40 MG/0.4ML IJ SOSY
40.0000 mg | PREFILLED_SYRINGE | INTRAMUSCULAR | Status: DC
  Administered 2020-07-06 – 2020-07-14 (×9): 40 mg via SUBCUTANEOUS
  Filled 2020-07-06 (×9): qty 0.4

## 2020-07-06 MED ORDER — SODIUM CHLORIDE 0.9 % IV SOLN
2.0000 g | Freq: Two times a day (BID) | INTRAVENOUS | Status: DC
  Administered 2020-07-06 – 2020-07-07 (×3): 2 g via INTRAVENOUS
  Filled 2020-07-06 (×7): qty 2

## 2020-07-06 MED ORDER — CHLORHEXIDINE GLUCONATE CLOTH 2 % EX PADS
6.0000 | MEDICATED_PAD | Freq: Every day | CUTANEOUS | Status: DC
  Administered 2020-07-06 – 2020-07-15 (×9): 6 via TOPICAL

## 2020-07-06 MED ORDER — SENNA 8.6 MG PO TABS
1.0000 | ORAL_TABLET | Freq: Two times a day (BID) | ORAL | Status: DC
  Administered 2020-07-06 – 2020-07-15 (×17): 8.6 mg via ORAL
  Filled 2020-07-06 (×17): qty 1

## 2020-07-06 MED ORDER — BISACODYL 10 MG RE SUPP: 10.0000 mg | Freq: Every day | RECTAL | Status: DC | PRN

## 2020-07-06 MED ORDER — SODIUM CHLORIDE 0.9 % IV SOLN: INTRAVENOUS | Status: DC

## 2020-07-06 MED ORDER — PNEUMOCOCCAL VAC POLYVALENT 25 MCG/0.5ML IJ INJ
0.5000 mL | INJECTION | INTRAMUSCULAR | Status: AC
  Administered 2020-07-07: 10:00:00 0.5 mL via INTRAMUSCULAR
  Filled 2020-07-06: qty 0.5

## 2020-07-06 MED ORDER — POLYETHYLENE GLYCOL 3350 17 G PO PACK
17.0000 g | PACK | Freq: Two times a day (BID) | ORAL | Status: DC
  Administered 2020-07-07 – 2020-07-14 (×15): 17 g via ORAL
  Filled 2020-07-06 (×15): qty 1

## 2020-07-06 NOTE — Consult Note (Signed)
WOC Nurse wound consult note Consultation was completed by review of records, images and assistance from the bedside nurse/clinical staff.  Reason for Consult: Left lower leg ulcer Reviewed MRI results Patient consultation per podiatry, agree with recommendations.  Updated orders for Silver Hydrofiber bc we do not carry alginate with silver in house. Also updated for coban and kerlix (dry boot) for compression therapy.  Wound type: venous stasis Pressure Injury POA: NA Measurement: 3cm x 2cm x 0.2cm  Wound bed:50% yellow fibrinous material  Drainage (amount, consistency, odor) see nursing flow sheets  Periwound:erthyma and edema  Dressing procedure/placement/frequency: Updated wound care orders;  Sliver hydrofiber, ABD pad, kerlix and Coban from toes to knees, changing 2x week (M/Th)  If drainage strike through increase frequency to M/W/F.   Patient will need scheduled follow up in a wound care center of the patient's choice at [place]  [facility] [address], [facility], [facility] [phone]

## 2020-07-06 NOTE — Evaluation (Signed)
Physical Therapy Evaluation Patient Details Name: Ronald Huerta MRN: [id number] DOB: 1934-10-22 Today's Date: 07/06/2020   History of Present Illness  Per MD notes: Pt is an 86 y.o. male with medical history significant of HLD, GERD, hypohyroidism, anxiety, deaf and mute, RLS, IBS, kidney stone, BPH, anemia, peripheral neuropathy, who presents with wound infection left leg.  MD assessment includes: Left Ankle wound infection, cellulitis, BPH with obstruction/lower urinary tract symptoms, and abdominal pain.    Clinical Impression  Pt was pleasant and motivated to participate during the session.  ASL interpreter Brandi [id number] utilized throughout the session.  Pt required min A with both transfers and bed mobility tasks but once pt was stable in standing he was able to amb without gross instability or LOB.  Pt initially reported that he was interested in walking around the nursing station twice but once he ambulated around once he reported feeling too fatigued to attempt a second lap and requested to return to bed.  Pt reported no pain or other adverse symptoms during the session with SpO2 and HR WNL throughout. Pt will benefit from HHPT upon discharge to safely address deficits listed in patient problem list for decreased caregiver assistance and eventual return to PLOF.      Follow Up Recommendations Home health PT;Supervision for mobility/OOB    Equipment Recommendations  None recommended by PT    Recommendations for Other Services       Precautions / Restrictions Precautions Precautions: Fall Restrictions Weight Bearing Restrictions: No      Mobility  Bed Mobility Overal bed mobility: Needs Assistance Bed Mobility: Supine to Sit;Sit to Supine     Supine to sit: Min assist Sit to supine: Min assist   General bed mobility comments: Min A for BLE management    Transfers Overall transfer level: Needs assistance Equipment used: Rolling walker (2 wheeled) Transfers:  Sit to/from Stand Sit to Stand: Min assist;From elevated surface         General transfer comment: Min A to prevent posterior LOB upon initial stand  Ambulation/Gait Ambulation/Gait assistance: Supervision Gait Distance (Feet): 160 Feet Assistive device: Rolling walker (2 wheeled) Gait Pattern/deviations: Step-through pattern;Decreased step length - right;Decreased step length - left;Trunk flexed Gait velocity: decreased   General Gait Details: Pt generally steady with gait but with flexed trunk posture and slow cadence  Stairs            Wheelchair Mobility    Modified Rankin (Stroke Patients Only)       Balance Overall balance assessment: Needs assistance   Sitting balance-Leahy Scale: Fair     Standing balance support: Bilateral upper extremity supported;During functional activity Standing balance-Leahy Scale: Poor Standing balance comment: Min A for stability upon initial stand                             Pertinent Vitals/Pain Pain Assessment: No/denies pain    Home Living Family/patient expects to be discharged to:: Private residence Living Arrangements: Children Available Help at Discharge: Family;Available 24 hours/day Type of Home: House Home Access: Stairs to enter Entrance Stairs-Rails: None Entrance Stairs-Number of Steps: 1 Home Layout: Two level;Able to live on main level with bedroom/bathroom Home Equipment: Gilmer Mor - single point;Walker - 2 wheels      Prior Function Level of Independence: Independent with assistive device(s)         Comments: Mod Ind amb with a SPC limited community distances, no falls in the last  year but reported feeling unsteady with ambulation, Ind with ADLs     Hand Dominance        Extremity/Trunk Assessment   Upper Extremity Assessment Upper Extremity Assessment: Generalized weakness    Lower Extremity Assessment Lower Extremity Assessment: Generalized weakness       Communication    Communication: Deaf;Interpreter utilized Merry Proud, [id number])  Cognition Arousal/Alertness: Awake/alert Behavior During Therapy: WFL for tasks assessed/performed Overall Cognitive Status: Within Functional Limits for tasks assessed                                        General Comments      Exercises Total Joint Exercises Ankle Circles/Pumps: AROM;Both;10 reps;Strengthening Quad Sets: Strengthening;Both;10 reps Gluteal Sets: 10 reps;Both;Strengthening Other Exercises Other Exercises: HEP education for BLE APs, QS, and GS x 10 each every 1-2 hours daily   Assessment/Plan    PT Assessment Patient needs continued PT services  PT Problem List Decreased strength;Decreased activity tolerance;Decreased balance;Decreased mobility;Decreased knowledge of use of DME       PT Treatment Interventions DME instruction;Gait training;Stair training;Functional mobility training;Therapeutic activities;Therapeutic exercise;Balance training;Patient/family education    PT Goals (Current goals can be found in the Care Plan section)  Acute Rehab PT Goals Patient Stated Goal: Pt reported no goals at this time PT Goal Formulation: With patient Time For Goal Achievement: 07/19/20 Potential to Achieve Goals: Good    Frequency Min 2X/week   Barriers to discharge        Co-evaluation               AM-PAC PT "6 Clicks" Mobility  Outcome Measure Help needed turning from your back to your side while in a flat bed without using bedrails?: A Little Help needed moving from lying on your back to sitting on the side of a flat bed without using bedrails?: A Little Help needed moving to and from a bed to a chair (including a wheelchair)?: A Little Help needed standing up from a chair using your arms (e.g., wheelchair or bedside chair)?: A Little Help needed to walk in hospital room?: A Little Help needed climbing 3-5 steps with a railing? : A Little 6 Click Score: 18    End of Session  Equipment Utilized During Treatment: Gait belt Activity Tolerance: Patient tolerated treatment well Patient left: in bed;with call bell/phone within reach;with bed alarm set;with family/visitor present;with nursing/sitter in room (pt declined up to chair) Nurse Communication: Mobility status PT Visit Diagnosis: Unsteadiness on feet (R26.81);Muscle weakness (generalized) (M62.81);Difficulty in walking, not elsewhere classified (R26.2)    Time: 3875-6433 PT Time Calculation (min) (ACUTE ONLY): 50 min   Charges:   PT Evaluation $PT Eval Moderate Complexity: 1 Mod PT Treatments $Therapeutic Exercise: 8-22 mins        D. Elly Modena PT, DPT 07/06/20, 3:53 PM

## 2020-07-06 NOTE — Evaluation (Signed)
Occupational Therapy Evaluation Patient Details Name: Ronald Huerta MRN: [id number] DOB: 1934/10/14 Today's Date: 07/06/2020    History of Present Illness Per MD notes: Pt is an 86 y.o. male with medical history significant of HLD, GERD, hypohyroidism, anxiety, deaf and mute, RLS, IBS, kidney stone, BPH, anemia, peripheral neuropathy, who presents with wound infection left leg.  MD assessment includes: Left Ankle wound infection, cellulitis, BPH with obstruction/lower urinary tract symptoms, and abdominal pain.   Clinical Impression   Patient presenting with decreased I in self care, balance, functional mobility/transfer, endurance, and safety awareness. Patient reports being Mod I with use of SPC for mobility and self care tasks and lives with son PTA. So is able to provide 24/7 supervision and assistance.Pt and son are hearing impaired and use of stratus interpreter for ASL this session.  Pt currently demonstrating self care tasks and mobility with use of RW and min A with posterior bias initially. Patient will benefit from acute OT to increase overall independence in the areas of ADLs, functional mobility, and safety awareness in order to safely discharge home with family.    Follow Up Recommendations  Home health OT;Supervision/Assistance - 24 hour    Equipment Recommendations  3 in 1 bedside commode;Other (comment) (for over toilet or to be used as shower seat)       Precautions / Restrictions Precautions Precautions: Fall Restrictions Weight Bearing Restrictions: No      Mobility Bed Mobility Overal bed mobility: Needs Assistance Bed Mobility: Supine to Sit;Sit to Supine     Supine to sit: Min assist Sit to supine: Min assist   General bed mobility comments: Min A for BLE management    Transfers Overall transfer level: Needs assistance Equipment used: Rolling walker (2 wheeled) Transfers: Sit to/from Stand Sit to Stand: Min assist         General transfer  comment: posterior bias    Balance Overall balance assessment: Needs assistance   Sitting balance-Leahy Scale: Fair     Standing balance support: Bilateral upper extremity supported;During functional activity Standing balance-Leahy Scale: Poor Standing balance comment: Min A for stability upon initial stand                           ADL either performed or assessed with clinical judgement   ADL Overall ADL's : Needs assistance/impaired                     Lower Body Dressing: Min guard   Toilet Transfer: Min guard;Regular Toilet;RW   Toileting- Clothing Manipulation and Hygiene: Minimal assistance;Sit to/from stand               Vision Baseline Vision/History: No visual deficits Patient Visual Report: No change from baseline              Pertinent Vitals/Pain Pain Assessment: No/denies pain        Extremity/Trunk Assessment Upper Extremity Assessment Upper Extremity Assessment: Generalized weakness   Lower Extremity Assessment Lower Extremity Assessment: Generalized weakness       Communication Communication Communication: Deaf;Interpreter utilized   Cognition Arousal/Alertness: Awake/alert Behavior During Therapy: WFL for tasks assessed/performed Overall Cognitive Status: Within Functional Limits for tasks assessed                                        Exercises Total Joint Exercises Ankle  Circles/Pumps: AROM;Both;10 reps;Strengthening Quad Sets: Strengthening;Both;10 reps Gluteal Sets: 10 reps;Both;Strengthening Other Exercises Other Exercises: HEP education for BLE APs, QS, and GS x 10 each every 1-2 hours daily        Home Living Family/patient expects to be discharged to:: Private residence Living Arrangements: Children Available Help at Discharge: Family;Available 24 hours/day Type of Home: House Home Access: Stairs to enter [employer] of Steps: 1 Entrance Stairs-Rails: None Home Layout:  Two level;Able to live on main level with bedroom/bathroom     Bathroom Shower/Tub: Tub/shower unit         Home Equipment: Cane - single point;Walker - 2 wheels          Prior Functioning/Environment Level of Independence: Independent with assistive device(s)        Comments: Mod Ind amb with a SPC limited community distances, no falls in the last year but reported feeling unsteady with ambulation, Ind with ADLs        OT Problem List: Decreased strength;Impaired balance (sitting and/or standing);Decreased cognition;Decreased knowledge of precautions;Decreased safety awareness;Decreased activity tolerance;Decreased knowledge of use of DME or AE      OT Treatment/Interventions: Self-care/ADL training;Manual therapy;Therapeutic exercise;Patient/family education;Modalities;Balance training;Neuromuscular education;Energy conservation;Therapeutic activities;DME and/or AE instruction;Cognitive remediation/compensation    OT Goals(Current goals can be found in the care plan section) Acute Rehab OT Goals Patient Stated Goal: to go home OT Goal Formulation: With patient/family Time For Goal Achievement: 07/20/20 Potential to Achieve Goals: Good ADL Goals Pt Will Perform Grooming: with modified independence;standing Pt Will Perform Lower Body Dressing: with modified independence;sit to/from stand Pt Will Transfer to Toilet: with modified independence;ambulating Pt Will Perform Toileting - Clothing Manipulation and hygiene: with modified independence;sit to/from stand  OT Frequency: Min 2X/week   Barriers to D/C:    none known at this time          AM-PAC OT "6 Clicks" Daily Activity     Outcome Measure Help from another person eating meals?: None Help from another person taking care of personal grooming?: A Little Help from another person toileting, which includes using toliet, bedpan, or urinal?: A Little Help from another person bathing (including washing, rinsing,  drying)?: A Little Help from another person to put on and taking off regular upper body clothing?: None Help from another person to put on and taking off regular lower body clothing?: A Little 6 Click Score: 20   End of Session Equipment Utilized During Treatment: Rolling walker Nurse Communication: Mobility status  Activity Tolerance: Patient tolerated treatment well Patient left: in bed;with call bell/phone within reach;with bed alarm set  OT Visit Diagnosis: Unsteadiness on feet (R26.81);Muscle weakness (generalized) (M62.81);Pain                Time: 1530-1610 OT Time Calculation (min): 40 min Charges:  OT General Charges $OT Visit: 1 Visit OT Evaluation $OT Eval Moderate Complexity: 1 Mod OT Treatments $Self Care/Home Management : 8-22 mins $Therapeutic Activity: 8-22 mins  Jackquline Denmark, MS, OTR/L , CBIS ascom [phone]  07/06/20, 4:34 PM

## 2020-07-06 NOTE — Consult Note (Addendum)
Pharmacy Antibiotic Note  Ronald Huerta is a 86 y.o. male admitted on 07/05/2020 with Wound infection.  Pharmacy has been consulted for cefepime dosing. Hx of enterococcus faecalis, MSSA, growing FEW PSEUDOMONAS AERUGINOSA in ankle culture.   Plan: Cefepime 2g q12H. Ceftriaxone d/c'ed.   Continue vancomycin 1250 mg q24H. Predicted AUC 448. Goal AUC 400-550. Plan to obtain levels prior to the 4th or 5th dose.   Height: 5\' 9"  (175.3 cm) Weight: 76.2 kg (168 lb) IBW/kg (Calculated) : 70.7  Temp (24hrs), Avg:98 F (36.7 C), Min:97.8 F (36.6 C), Max:98.5 F (36.9 C)  Recent Labs  Lab 07/05/20 0545 07/06/20 0623 07/06/20 1137  WBC 11.0* 7.6  --   CREATININE 1.07  --  1.00  LATICACIDVEN 1.2  --   --     Estimated Creatinine Clearance: 56 mL/min (by C-G formula based on SCr of 1 mg/dL).    No Known Allergies  Antimicrobials this admission: 6/6 cefepime >>  6/5 vancomycin >>  6/5 ceftriaxone >> 6/6  Dose adjustments this admission: None  Microbiology results: 6/5 BCx: pending 6/5 UCx: pending 6/5  Ankle cx: FEW PSEUDOMONAS AERUGINOSA   Thank you for allowing pharmacy to be a part of this patient's care.  8/5 07/06/2020 3:21 PM

## 2020-07-06 NOTE — Progress Notes (Signed)
Patient experiencing urgency this am. Has been sitting on commode for an hour trying to urinate. He has only produced minimal to no amounts of urine each time he goes to the bathroom. Complaining of lower abdominal pain. This nurse bladder scanned him and observed 784 mL of urine in the bladder. MD made aware while rounding and received a verbal order to insert foley catheter. Will carry out order and continue to monitor.

## 2020-07-06 NOTE — Progress Notes (Signed)
PROGRESS NOTE    Ronald CabalFranklin Delano Huerta  ZOX:[id number] DOB: October 01, 1934 DOA: 07/05/2020 PCP: Barbette ReichmannHande, Vishwanath, MD   Brief Narrative: 86 year old with past medical history significant for HLD, CAD, hypothyroidism, anxiety, deaf apnea, RLS, IBS, kidney stone, BPH, anemia, peripheral neuropathy who presents with worsening wound infection left leg. Patient reports chronic left 1" for 2 months.  Patient currently taking Bactrim.  He reported worsening pain, purulent drainage and malodorous smell.  Reports intermittent fever and chills.  Patient  admitted with worsening left ankle wound infection.  Assessment & Plan:   Principal Problem:   Wound infection-left leg Active Problems:   BPH with obstruction/lower urinary tract symptoms   Cellulitis of leg, left   Acquired hypothyroidism   Deaf, nonspeaking   GERD (gastroesophageal reflux disease)   Abdominal pain  1-Left Ankle wound infection, cellulitis;  -Present with worsening redness, drainage, patient was taking Bactrim prior to admission. -WJX:[id number]:Posteromedial cutaneous ulceration along the ankle is relatively superficial, without abscess, gas in the soft tissues, or bony involvement. Tibialis posterior tenosynovitis and common peroneus tenosynovitis. Mild flexor hallucis longus tenosynovitis.  Ill definition of the medioplantar oblique portion of the spring ligament, likely chronic injury given similar findings in 2017. Subcutaneous edema along the ankle and tracking dorsally in the foot, primarily in the lateral and anteromedial ankle. -Evaluated by Dr. Ether GriffinsFowler  no need for debridement.  Continue with antibiotics and local wound care -Culture: Growing few Pseudomonas, moderate gram-positive cocci -Change ceftriaxone to cefepime to cover for Pseudomonas, continue with IV vancomycin -*CRP elevated. -Follow blood cultures  2-BPH with obstruction/lower urinary tract symptoms: Continue with Flomax and Proscar Patient required Foley catheter  placement 6/6 for urinary retention  3-Hypothyroidism: Continue with Synthroid  4-Deafness; Need sign language translator  5-GERD: Continue with PPI  6-Abdominal pain: CT scan is negative very suggestive of constipation. Continue with bowel regimen Schedule miralax, change senna to BID>   Mild hyponatremia: Monitor      Estimated body mass index is 24.81 kg/m as calculated from the following:   Height as of this encounter: 5\' 9"  (1.753 m).   Weight as of this encounter: 76.2 kg.   DVT prophylaxis: Lovenox Code Status: Full code Family Communication: Care discussed with patient Disposition Plan:  Status is: Inpatient  Remains inpatient appropriate because:Unsafe d/c plan and IV treatments appropriate due to intensity of illness or inability to take PO   Dispo: The patient is from: Home              Anticipated d/c is to: Home              Patient currently is not medically stable to d/c.   Difficult to place patient No        Consultants:   Podiatry  Procedures:   9  Antimicrobials:    Subjective: I used iPad sign Presenter, broadcastinglanguage translator for evaluation.  Patient report left ankle pain persist, redness absent.  He is feeling a little bit better.  Objective: Vitals:   07/05/20 1600 07/05/20 2042 07/06/20 0050 07/06/20 0544  BP: (!) 109/54 133/75 109/74 111/60  Pulse: 79 86 88 92  Resp: 16 18 16 18   Temp:  97.8 F (36.6 C) 97.8 F (36.6 C) 97.8 F (36.6 C)  TempSrc:   Oral   SpO2: 97% 96% 96% 95%  Weight:      Height:        Intake/Output Summary (Last 24 hours) at 07/06/2020 0739 Last data filed at 07/06/2020 0327 Gross per  24 hour  Intake --  Output 1200 ml  Net -1200 ml   Filed Weights   07/05/20 0540  Weight: 76.2 kg    Examination:  General exam: Appears calm and comfortable  Respiratory system: Clear to auscultation. Respiratory effort normal. Cardiovascular system: S1 & S2 heard, RRR. No JVD, murmurs, rubs, gallops or clicks. No  pedal edema. Gastrointestinal system: Abdomen is nondistended, soft and nontender. No organomegaly or masses felt. Normal bowel sounds heard.  Central nervous system: Alert and oriented.  Extremities: Left ankle with redness, open wound 3 cm, see pictures  Data Reviewed: I have personally reviewed following labs and imaging studies  CBC: Recent Labs  Lab 07/05/20 0545 07/06/20 0623  WBC 11.0* 7.6  NEUTROABS 9.5*  --   HGB 10.6* 10.0*  HCT 31.4* 29.2*  MCV 98.7 96.1  PLT 179 156   Basic Metabolic Panel: Recent Labs  Lab 07/05/20 0545  NA 133*  K 4.0  CL 102  CO2 23  GLUCOSE 105*  BUN 21  CREATININE 1.07  CALCIUM 8.7*   GFR: Estimated Creatinine Clearance: 52.3 mL/min (by C-G formula based on SCr of 1.07 mg/dL). Liver Function Tests: Recent Labs  Lab 07/05/20 0545  AST 43*  ALT 29  ALKPHOS 100  BILITOT 1.6*  PROT 7.1  ALBUMIN 3.9   Recent Labs  Lab 07/05/20 0545  LIPASE 46   No results for input(s): AMMONIA in the last 168 hours. Coagulation Profile: Recent Labs  Lab 07/06/20 0623  INR 1.1   Cardiac Enzymes: No results for input(s): CKTOTAL, CKMB, CKMBINDEX, TROPONINI in the last 168 hours. BNP (last 3 results) No results for input(s): PROBNP in the last 8760 hours. HbA1C: No results for input(s): HGBA1C in the last 72 hours. CBG: No results for input(s): GLUCAP in the last 168 hours. Lipid Profile: No results for input(s): CHOL, HDL, LDLCALC, TRIG, CHOLHDL, LDLDIRECT in the last 72 hours. Thyroid Function Tests: No results for input(s): TSH, T4TOTAL, FREET4, T3FREE, THYROIDAB in the last 72 hours. Anemia Panel: No results for input(s): VITAMINB12, FOLATE, FERRITIN, TIBC, IRON, RETICCTPCT in the last 72 hours. Sepsis Labs: Recent Labs  Lab 07/05/20 0545  PROCALCITON 0.48  LATICACIDVEN 1.2    Recent Results (from the past 240 hour(s))  Culture, blood (routine x 2)     Status: None (Preliminary result)   Collection Time: 07/05/20  5:45 AM    Specimen: BLOOD  Result Value Ref Range Status   Specimen Description BLOOD RHAND  Final   Special Requests   Final    BOTTLES DRAWN AEROBIC AND ANAEROBIC Blood Culture adequate volume   Culture   Final    NO GROWTH <12 HOURS Performed at [facility], [address] [place], [place] [address]    Report Status PENDING  Incomplete  Culture, blood (routine x 2)     Status: None (Preliminary result)   Collection Time: 07/05/20  5:45 AM   Specimen: BLOOD  Result Value Ref Range Status   Specimen Description BLOOD  LEFT HAND  Final   Special Requests   Final    BOTTLES DRAWN AEROBIC AND ANAEROBIC Blood Culture adequate volume   Culture   Final    NO GROWTH <12 HOURS Performed at [facility], [address] [place], [place] [address]    Report Status PENDING  Incomplete  Resp Panel by RT-PCR (Flu A&B, Covid) Nasopharyngeal Swab     Status: None   Collection Time: 07/05/20  5:45 AM  Specimen: Nasopharyngeal Swab; Nasopharyngeal(NP) swabs in vial transport medium  Result Value Ref Range Status   SARS Coronavirus 2 by RT PCR NEGATIVE NEGATIVE Final    Comment: (NOTE) SARS-CoV-2 target nucleic acids are NOT DETECTED.  The SARS-CoV-2 RNA is generally detectable in upper respiratory specimens during the acute phase of infection. The lowest concentration of SARS-CoV-2 viral copies this assay can detect is 138 copies/mL. A negative result does not preclude SARS-Cov-2 infection and should not be used as the sole basis for treatment or other patient management decisions. A negative result may occur with  improper specimen collection/handling, submission of specimen other than nasopharyngeal swab, presence of viral mutation(s) within the areas targeted by this assay, and inadequate number of viral copies(<138 copies/mL). A negative result must be combined with clinical observations, patient history, and epidemiological information. The expected result is  Negative.  Fact Sheet for Patients:  BloggerCourse.com  Fact Sheet for Healthcare Providers:  SeriousBroker.it  This test is no t yet approved or cleared by the [place] FDA and  has been authorized for detection and/or diagnosis of SARS-CoV-2 by FDA under an Emergency Use Authorization (EUA). This EUA will remain  in effect (meaning this test can be used) for the duration of the COVID-19 declaration under Section 564(b)(1) of the Act, 21 U.S.C.section 360bbb-3(b)(1), unless the authorization is terminated  or revoked sooner.       Influenza A by PCR NEGATIVE NEGATIVE Final   Influenza B by PCR NEGATIVE NEGATIVE Final    Comment: (NOTE) The Xpert Xpress SARS-CoV-2/FLU/RSV plus assay is intended as an aid in the diagnosis of influenza from Nasopharyngeal swab specimens and should not be used as a sole basis for treatment. Nasal washings and aspirates are unacceptable for Xpert Xpress SARS-CoV-2/FLU/RSV testing.  Fact Sheet for Patients: BloggerCourse.com  Fact Sheet for Healthcare Providers: SeriousBroker.it  This test is not yet approved or cleared by the [place] FDA and has been authorized for detection and/or diagnosis of SARS-CoV-2 by FDA under an Emergency Use Authorization (EUA). This EUA will remain in effect (meaning this test can be used) for the duration of the COVID-19 declaration under Section 564(b)(1) of the Act, 21 U.S.C. section 360bbb-3(b)(1), unless the authorization is terminated or revoked.  Performed at [facility], [address], [place], [place] [address]   Aerobic Culture w Gram Stain (superficial specimen)     Status: None (Preliminary result)   Collection Time: 07/05/20  5:45 AM   Specimen: Ankle  Result Value Ref Range Status   Specimen Description ANKLE  Final   Special Requests LEFT  Final   Gram Stain   Final    NO WBC  SEEN MODERATE GRAM POSITIVE COCCI MODERATE GRAM VARIABLE ROD Performed at [facility], [address], [place], [place] [address]    Culture PENDING  Incomplete   Report Status PENDING  Incomplete         Radiology Studies: CT ABDOMEN PELVIS WO CONTRAST  Result Date: 07/05/2020 CLINICAL DATA:  Abdominal pain, weakness and nausea EXAM: CT ABDOMEN AND PELVIS WITHOUT CONTRAST TECHNIQUE: Multidetector CT imaging of the abdomen and pelvis was performed following the standard protocol without IV contrast. COMPARISON:  CT abdomen pelvis February 21, 2020 FINDINGS: Lower chest: Bibasilar atelectasis. Stable calcified pleural plaque in the left lung base. Normal size heart. Coronary artery calcifications. No significant pericardial effusion/thickening. Hepatobiliary: Unremarkable noncontrast appearance of the hepatic parenchyma. Gallbladder is surgically absent. Mild prominence of the biliary tree likely reservoir  effect post cholecystectomy. Pancreas: Within normal limits. Spleen: Unremarkable. Adrenals/Urinary Tract: Adrenal glands are unremarkable. Kidneys are normal, without renal calculi, contour deforming lesion, or hydronephrosis. Bladder is unremarkable. Stomach/Bowel: Small hiatal hernia otherwise the stomach is grossly unremarkable. No pathologic dilation of small bowel. Radiopaque enteric contrast traverses the splenic flexure. Normal appendix. Terminal ileum is unremarkable. Moderate volume of formed stool throughout the colon. Vascular/Lymphatic: Aortic atherosclerosis without aneurysmal dilation. Reproductive: Prostatomegaly with dystrophic prostatic calcifications. Other: Small fat and fluid containing right inguinal hernia. Musculoskeletal: No acute osseous abnormality. IMPRESSION: 1. No acute abnormality in the abdomen or pelvis. 2. Moderate volume of formed stool throughout the colon, suggests of constipation. 3. Small fat and fluid containing right inguinal hernia. 4. Aortic  atherosclerosis. Aortic Atherosclerosis (ICD10-I70.0). Electronically Signed   By: Maudry Mayhew MD   On: 07/05/2020 15:37   DG Ankle Complete Left  Result Date: 07/05/2020 CLINICAL DATA:  86 year old male with weakness, nausea, possible lower extremity infection. EXAM: LEFT ANKLE COMPLETE - 3+ VIEW COMPARISON:  Left ankle series 02/21/2020. FINDINGS: Calcified peripheral vascular disease and/or dystrophic soft tissue calcifications. Similar generalized soft tissue swelling about the ankle since January. No soft tissue gas. Stable mortise joint alignment. No evidence of joint effusion. No acute fracture or dislocation. Osteopenia, most pronounced in the bilateral lower talus. No convincing osteolysis. IMPRESSION: 1. Similar generalized soft tissue swelling since January. No soft tissue gas. 2. Osteopenia with no convincing plain radiographic evidence of osteomyelitis. MRI or 3 Phase Bone Scan would be most sensitive. Electronically Signed   By: Odessa Fleming M.D.   On: 07/05/2020 06:29   MR ANKLE LEFT WO CONTRAST  Result Date: 07/05/2020 CLINICAL DATA:  Chronic left ankle wound. EXAM: MRI OF THE LEFT ANKLE WITHOUT CONTRAST TECHNIQUE: Multiplanar, multisequence MR imaging of the ankle was performed. No intravenous contrast was administered. COMPARISON:  Radiographs from 07/05/2020 and MRI from 04/15/2015 FINDINGS: TENDONS Peroneal: Common peroneus tenosynovitis. Posteromedial: Tibialis posterior tenosynovitis. The tibialis posterior attached as to a type 2 accessory navicular. Mild flexor hallucis longus tenosynovitis at the knot of Henry. Anterior: Unremarkable Achilles: Unremarkable Plantar Fascia: Unremarkable LIGAMENTS Lateral: Unremarkable Medial: The deltoid ligament appears intact. Ill definition of the medioplantar oblique portion of the spring ligament. CARTILAGE Ankle Joint: Unremarkable Subtalar Joints/Sinus Tarsi: Unremarkable Bones: Unremarkable Other: Subcutaneous edema medially and laterally along the  ankle and tracking in the dorsum of the foot. Cutaneous irregularity compatible with ulceration along the posteromedial ankle measuring about 2.2 by 2.6 cm, without drainable abscess, and with subcutaneous edema in the vicinity not significantly disproportionately greater than the subcutaneous edema elsewhere in the ankle. IMPRESSION: 1. Posteromedial cutaneous ulceration along the ankle is relatively superficial, without abscess, gas in the soft tissues, or bony involvement. 2. Tibialis posterior tenosynovitis and common peroneus tenosynovitis. Mild flexor hallucis longus tenosynovitis. 3. Ill definition of the medioplantar oblique portion of the spring ligament, likely chronic injury given similar findings in 2017. 4. Subcutaneous edema along the ankle and tracking dorsally in the foot, primarily in the lateral and anteromedial ankle. Electronically Signed   By: Gaylyn Rong M.D.   On: 07/05/2020 17:21   DG Chest Port 1 View  Result Date: 07/05/2020 CLINICAL DATA:  Weakness.  Nausea. EXAM: PORTABLE CHEST 1 VIEW COMPARISON:  Chest x-ray 08/16/2019, CT chest 04/09/2008. FINDINGS: The heart size and mediastinal contours are unchanged. Aortic calcification. No focal consolidation. Similar-appearing coarsened interstitial working and flattened hemidiaphragms consistent with emphysematous changes. No overt pulmonary edema. No pleural effusion. No pneumothorax. Eventration  of bilateral hemidiaphragms. No acute osseous abnormality. IMPRESSION: 1. No acute cardiopulmonary disease. 2. Aortic Atherosclerosis (ICD10-I70.0) and Emphysema (ICD10-J43.9). Electronically Signed   By: Tish Frederickson M.D.   On: 07/05/2020 06:18        Scheduled Meds: . ferrous sulfate  325 mg Oral Q breakfast  . finasteride  5 mg Oral Daily  . folic acid  500 mcg Oral Daily  . gabapentin  100 mg Oral TID  . heparin  5,000 Units Subcutaneous Q8H  . levothyroxine  25 mcg Oral Q0600  . multivitamin with minerals  1 tablet Oral  Daily  . omega-3 acid ethyl esters  1 g Oral Daily  . pantoprazole  40 mg Oral Daily  . senna  1 tablet Oral Daily  . tamsulosin  0.4 mg Oral Daily  . vitamin B-12  1,000 mcg Oral Daily   Continuous Infusions: . cefTRIAXone (ROCEPHIN)  IV 2 g (07/06/20 0616)  . promethazine (PHENERGAN) injection (IM or IVPB) 12.5 mg (07/05/20 2234)  . vancomycin       LOS: 1 day    Time spent: 35 minutes.     Alba Cory, MD [facility]   If 7PM-7AM, please contact night-coverage [email or web address]  07/06/2020, 7:39 AM

## 2020-07-06 NOTE — Progress Notes (Signed)
Daily Progress Note   Subjective  - * No surgery found *  F/u MRI left ankle.  Objective Vitals:   07/05/20 1600 07/05/20 2042 07/06/20 0050 07/06/20 0544  BP: (!) 109/54 133/75 109/74 111/60  Pulse: 79 86 88 92  Resp: 16 18 16 18   Temp:  97.8 F (36.6 C) 97.8 F (36.6 C) 97.8 F (36.6 C)  TempSrc:   Oral   SpO2: 97% 96% 96% 95%  Weight:      Height:        Physical Exam: IMPRESSION: 1. Posteromedial cutaneous ulceration along the ankle is relatively superficial, without abscess, gas in the soft tissues, or bony involvement. 2. Tibialis posterior tenosynovitis and common peroneus tenosynovitis. Mild flexor hallucis longus tenosynovitis. 3. Ill definition of the medioplantar oblique portion of the spring ligament, likely chronic injury given similar findings in 2017. 4. Subcutaneous edema along the ankle and tracking dorsally in the foot, primarily in the lateral and anteromedial ankle.  Laboratory CBC    Component Value Date/Time   WBC 7.6 07/06/2020 0623   HGB 10.0 (L) 07/06/2020 0623   HGB 10.1 (L) 12/21/2013 0701   HCT 29.2 (L) 07/06/2020 0623   HCT 30.8 (L) 12/21/2013 0701   PLT 156 07/06/2020 0623   PLT 243 12/21/2013 0701    BMET    Component Value Date/Time   NA 133 (L) 07/05/2020 0545   NA 142 12/20/2013 0433   K 4.0 07/05/2020 0545   K 3.9 12/20/2013 0433   CL 102 07/05/2020 0545   CL 109 (H) 12/20/2013 0433   CO2 23 07/05/2020 0545   CO2 29 12/20/2013 0433   GLUCOSE 105 (H) 07/05/2020 0545   GLUCOSE 76 12/20/2013 0433   BUN 21 07/05/2020 0545   BUN 5 (L) 12/20/2013 0433   CREATININE 1.07 07/05/2020 0545   CREATININE 0.84 12/20/2013 0433   CALCIUM 8.7 (L) 07/05/2020 0545   CALCIUM 7.7 (L) 12/20/2013 0433   GFRNONAA >60 07/05/2020 0545   GFRNONAA >60 12/20/2013 0433   GFRAA >60 08/16/2019 0214   GFRAA >60 12/20/2013 0433    Assessment/Planning: Venous ulcer left ankle Cellulitis left ankle.    No formal surgical debridement needed  at this time.  Dressing changes written for daily dressing with calcium alginate with silver and gauze dressing and cover with ace wrap.  Pt would benefit from wound care and recommend wound care referral.  Will likely need more formal compression therapy along with local wound care.  PO broad spectrum abx upon d/c.  Podiatry to sign off but can be re-consulted if needed.  12/22/2013 A  07/06/2020, 8:14 AM

## 2020-07-07 LAB — MAGNESIUM: Magnesium: 1.9 mg/dL (ref 1.7–2.4)

## 2020-07-07 LAB — AEROBIC CULTURE W GRAM STAIN (SUPERFICIAL SPECIMEN): Gram Stain: NONE SEEN

## 2020-07-07 LAB — CBC
HCT: 26.7 % — ABNORMAL LOW (ref 39.0–52.0)
Hemoglobin: 8.8 g/dL — ABNORMAL LOW (ref 13.0–17.0)
MCH: 32.8 pg (ref 26.0–34.0)
MCHC: 33 g/dL (ref 30.0–36.0)
MCV: 99.6 fL (ref 80.0–100.0)
Platelets: 161 10*3/uL (ref 150–400)
RBC: 2.68 MIL/uL — ABNORMAL LOW (ref 4.22–5.81)
RDW: 15.4 % (ref 11.5–15.5)
WBC: 5.5 10*3/uL (ref 4.0–10.5)
nRBC: 0 % (ref 0.0–0.2)

## 2020-07-07 LAB — BASIC METABOLIC PANEL
Anion gap: 5 (ref 5–15)
BUN: 19 mg/dL (ref 8–23)
CO2: 28 mmol/L (ref 22–32)
Calcium: 8.2 mg/dL — ABNORMAL LOW (ref 8.9–10.3)
Chloride: 106 mmol/L (ref 98–111)
Creatinine, Ser: 0.94 mg/dL (ref 0.61–1.24)
GFR, Estimated: >60 mL/min (ref >60)
Glucose, Bld: 88 mg/dL (ref 70–99)
Potassium: 4.3 mmol/L (ref 3.5–5.1)
Sodium: 139 mmol/L (ref 135–145)

## 2020-07-07 LAB — FOLATE: Folate: 19.8 ng/mL (ref >5.9)

## 2020-07-07 LAB — RETICULOCYTES
Immature Retic Fract: 18 % — ABNORMAL HIGH (ref 2.3–15.9)
RBC.: 2.77 MIL/uL — ABNORMAL LOW (ref 4.22–5.81)
Retic Count, Absolute: 39.3 10*3/uL (ref 19.0–186.0)
Retic Ct Pct: 1.4 % (ref 0.4–3.1)

## 2020-07-07 LAB — IRON AND TIBC
Iron: 44 ug/dL — ABNORMAL LOW (ref 45–182)
Saturation Ratios: 25 % (ref 17.9–39.5)
TIBC: 176 ug/dL — ABNORMAL LOW (ref 250–450)
UIBC: 132 ug/dL

## 2020-07-07 LAB — FERRITIN: Ferritin: 710 ng/mL — ABNORMAL HIGH (ref 24–336)

## 2020-07-07 LAB — VITAMIN B12: Vitamin B-12: 1533 pg/mL — ABNORMAL HIGH (ref 180–914)

## 2020-07-07 MED ORDER — METOPROLOL TARTRATE 25 MG PO TABS: 12.5000 mg | ORAL_TABLET | Freq: Two times a day (BID) | ORAL | Status: DC

## 2020-07-07 MED ORDER — BISACODYL 10 MG RE SUPP
10.0000 mg | Freq: Once | RECTAL | Status: AC
  Administered 2020-07-07: 15:00:00 10 mg via RECTAL
  Filled 2020-07-07: qty 1

## 2020-07-07 NOTE — Progress Notes (Signed)
Physical Therapy Treatment Patient Details Name: Ronald Huerta MRN: [id number] DOB: 01-04-1935 Today's Date: 07/07/2020    History of Present Illness Per MD notes: Pt is an 86 y.o. male with medical history significant of HLD, GERD, hypohyroidism, anxiety, deaf and mute, RLS, IBS, kidney stone, BPH, anemia, peripheral neuropathy, who presents with wound infection left leg.  MD assessment includes: Left Ankle wound infection, cellulitis, BPH with obstruction/lower urinary tract symptoms, and abdominal pain.    PT Comments    Pt was pleasant and motivated to participate during the session.  ASL interpreter Abby [id number] utilized throughout the session.  Pt continued to present with min posterior instability upon coming to initial stand but grossly improved from prior session.  Pt was steady with amb with cues for upright posture and amb closer to the RW.  Pt was able to navigate a large box step up/down with a RW with min cues for sequencing with good eccentric and concentric control and stability.  Pt will benefit from HHPT upon discharge to safely address deficits listed in patient problem list for decreased caregiver assistance and eventual return to PLOF.   Follow Up Recommendations  Home health PT;Supervision for mobility/OOB     Equipment Recommendations  None recommended by PT    Recommendations for Other Services       Precautions / Restrictions Precautions Precautions: Fall Restrictions Weight Bearing Restrictions: No    Mobility  Bed Mobility Overal bed mobility: Modified Independent             General bed mobility comments: Extra time and effort only    Transfers Overall transfer level: Needs assistance Equipment used: Rolling walker (2 wheeled) Transfers: Sit to/from Stand Sit to Stand: Min assist         General transfer comment: Min A to correct post instability  Ambulation/Gait Ambulation/Gait assistance: Supervision Gait Distance (Feet): 200  Feet x 1, 125 Feet x 1 Assistive device: Rolling walker (2 wheeled) Gait Pattern/deviations: Step-through pattern;Decreased step length - right;Decreased step length - left;Trunk flexed Gait velocity: decreased   General Gait Details: Pt generally steady with gait but with flexed trunk posture and slow cadence   Stairs Stairs: Yes Stairs assistance: Min guard Stair Management: No rails;With walker;Step to pattern;Forwards Number of Stairs: 1 General stair comments: Ascend/descend 1 step x 2 with RW and mod verbal cues for general sequencing; good eccentric and concentric control and stability   Wheelchair Mobility    Modified Rankin (Stroke Patients Only)       Balance Overall balance assessment: Needs assistance   Sitting balance-Leahy Scale: Good     Standing balance support: Bilateral upper extremity supported;During functional activity Standing balance-Leahy Scale: Poor Standing balance comment: Min A for stability upon initial stand                            Cognition Arousal/Alertness: Awake/alert Behavior During Therapy: WFL for tasks assessed/performed Overall Cognitive Status: Within Functional Limits for tasks assessed                                        Exercises      General Comments        Pertinent Vitals/Pain Pain Assessment: No/denies pain    Home Living  Prior Function            PT Goals (current goals can now be found in the care plan section) Progress towards PT goals: Progressing toward goals    Frequency    Min 2X/week      PT Plan Current plan remains appropriate    Co-evaluation              AM-PAC PT "6 Clicks" Mobility   Outcome Measure  Help needed turning from your back to your side while in a flat bed without using bedrails?: A Little Help needed moving from lying on your back to sitting on the side of a flat bed without using bedrails?: A  Little Help needed moving to and from a bed to a chair (including a wheelchair)?: A Little Help needed standing up from a chair using your arms (e.g., wheelchair or bedside chair)?: A Little Help needed to walk in hospital room?: A Little Help needed climbing 3-5 steps with a railing? : A Little 6 Click Score: 18    End of Session Equipment Utilized During Treatment: Gait belt Activity Tolerance: Patient tolerated treatment well Patient left: in bed;with call bell/phone within reach;with bed alarm set;with nursing/sitter in room;Other (comment) (Pt declined up in chair; CNA in room for vitals at end of session) Nurse Communication: Mobility status PT Visit Diagnosis: Unsteadiness on feet (R26.81);Muscle weakness (generalized) (M62.81);Difficulty in walking, not elsewhere classified (R26.2)     Time: 3818-2993 PT Time Calculation (min) (ACUTE ONLY): 31 min  Charges:  $Gait Training: 23-37 mins                     D. Scott Amedio Bowlby PT, DPT 07/07/20, 5:34 PM

## 2020-07-07 NOTE — Progress Notes (Signed)
PROGRESS NOTE    Ronald Huerta  VFI:[id number] DOB: 23-Oct-1934 DOA: 07/05/2020 PCP: Barbette Reichmann, MD   Brief Narrative: 86 year old with past medical history significant for HLD, CAD, hypothyroidism, anxiety, deaf apnea, RLS, IBS, kidney stone, BPH, anemia, peripheral neuropathy who presents with worsening wound infection left leg. Patient reports chronic left 1" for 2 months.  Patient currently taking Bactrim.  He reported worsening pain, purulent drainage and malodorous smell.  Reports intermittent fever and chills.  Patient  admitted with worsening left ankle wound infection.  Assessment & Plan:   Principal Problem:   Wound infection-left leg Active Problems:   BPH with obstruction/lower urinary tract symptoms   Cellulitis of leg, left   Acquired hypothyroidism   Deaf, nonspeaking   GERD (gastroesophageal reflux disease)   Abdominal pain  1-Left Ankle wound infection, cellulitis;  -Present with worsening redness, drainage, patient was taking Bactrim prior to admission. -CZY:[id number] cutaneous ulceration along the ankle is relatively superficial, without abscess, gas in the soft tissues, or bony involvement. Tibialis posterior tenosynovitis and common peroneus tenosynovitis. Mild flexor hallucis longus tenosynovitis.  Ill definition of the medioplantar oblique portion of the spring ligament, likely chronic injury given similar findings in 2017. Subcutaneous edema along the ankle and tracking dorsally in the foot, primarily in the lateral and anteromedial ankle. -Evaluated by Dr. Ether Griffins  no need for debridement.  Continue with antibiotics and local wound care. -Culture: Growing few Pseudomonas, moderate gram-positive cocci. Pending.  -Continue with Cefepime to cover for Pseudomonas Day 2, continue with IV vancomycin -CRP elevated. -Blood cultures: No growth to date.  -Ankle with significant redness, plan to continue with IV antibiotics.   2-BPH with  obstruction/lower urinary tract symptoms: Continue with Flomax and Proscar Patient required Foley catheter placement 6/6 for urinary retention Needs Voiding trial in 24--48 hours.   3-Hypothyroidism: Continue with Synthroid  4-Deafness; Need sign language translator  5-GERD: Continue with PPI  6-Abdominal pain: CT scan is negative very suggestive of constipation. Continue with bowel regimen Schedule miralax, change senna to BID>  No BM yet. Plan for dulcolax suppository today.   Mild hyponatremia: Monitor  ? A fib; EKG sinus PAC. Rhythm review by cardiology on Epic consistent with sinus  PAC, PVC.  Mg 1.9.   Anemia, Iron deficiency: -Started on Ferrous Sulfate.  -B 12 Pending.    Estimated body mass index is 24.81 kg/m as calculated from the following:   Height as of this encounter: 5\' 9"  (1.753 m).   Weight as of this encounter: 76.2 kg.   DVT prophylaxis: Lovenox Code Status: Full code Family Communication: Care discussed with patient, Son updated with sign language translator at bedside 07/06/2020. Disposition Plan:  Status is: Inpatient  Remains inpatient appropriate because:Unsafe d/c plan and IV treatments appropriate due to intensity of illness or inability to take PO   Dispo: The patient is from: Home              Anticipated d/c is to: Home              Patient currently is not medically stable to d/c.   Difficult to place patient No        Consultants:   Podiatry  Procedures:   9  Antimicrobials:    Subjective: I use sign language translator via  I Pad:  Patient feeling ok, his mood is better, although he is bored and there is nothing to do in the hospital.  He has not had BM.  Left Ankle  look red, no drainage from open wound.   Objective: Vitals:   07/07/20 0024 07/07/20 0413 07/07/20 0817 07/07/20 1154  BP: (!) 141/85 104/72 (!) 116/52 (!) 101/51  Pulse: 72 62 70 (!) 57  Resp: Temp: 98.8 F (37.1 C) 98.4 F (36.9 C)  98 F (36.7 C) 97.6 F (36.4 C)  TempSrc:      SpO2: 97% 96% 96% 96%  Weight:      Height:        Intake/Output Summary (Last 24 hours) at 07/07/2020 1306 Last data filed at 07/07/2020 1000 Gross per 24 hour  Intake 1641.25 ml  Output 2325 ml  Net -683.75 ml   Filed Weights   07/05/20 0540  Weight: 76.2 kg    Examination:  General exam: NAD Respiratory system: CTA Cardiovascular system: S 1, S 2 RRR Gastrointestinal system: BS present, soft, nt Central nervous system: Alert, oriented.  Extremities: Left Ankle with redness, edema open wound 3 cm, see pictures  Data Reviewed: I have personally reviewed following labs and imaging studies  CBC: Recent Labs  Lab 07/05/20 0545 07/06/20 0623 07/07/20 0514  WBC 11.0* 7.6 5.5  NEUTROABS 9.5*  --   --   HGB 10.6* 10.0* 8.8*  HCT 31.4* 29.2* 26.7*  MCV 98.7 96.1 99.6  PLT 179 156 161   Basic Metabolic Panel: Recent Labs  Lab 07/05/20 0545 07/06/20 1137 07/07/20 0514 07/07/20 0806  NA 133*  --  139  --   K 4.0  --  4.3  --   CL 102  --  106  --   CO2 23  --  28  --   GLUCOSE 105*  --  88  --   BUN 21  --  19  --   CREATININE 1.07 1.00 0.94  --   CALCIUM 8.7*  --  8.2*  --   MG  --   --   --  1.9   GFR: Estimated Creatinine Clearance: 59.5 mL/min (by C-G formula based on SCr of 0.94 mg/dL). Liver Function Tests: Recent Labs  Lab 07/05/20 0545  AST 43*  ALT 29  ALKPHOS 100  BILITOT 1.6*  PROT 7.1  ALBUMIN 3.9   Recent Labs  Lab 07/05/20 0545  LIPASE 46   No results for input(s): AMMONIA in the last 168 hours. Coagulation Profile: Recent Labs  Lab 07/06/20 0623  INR 1.1   Cardiac Enzymes: No results for input(s): CKTOTAL, CKMB, CKMBINDEX, TROPONINI in the last 168 hours. BNP (last 3 results) No results for input(s): PROBNP in the last 8760 hours. HbA1C: No results for input(s): HGBA1C in the last 72 hours. CBG: No results for input(s): GLUCAP in the last 168 hours. Lipid Profile: No results  for input(s): CHOL, HDL, LDLCALC, TRIG, CHOLHDL, LDLDIRECT in the last 72 hours. Thyroid Function Tests: No results for input(s): TSH, T4TOTAL, FREET4, T3FREE, THYROIDAB in the last 72 hours. Anemia Panel: Recent Labs    07/07/20 0806  FOLATE 19.8  FERRITIN 710*  TIBC 176*  IRON 44*  RETICCTPCT 1.4   Sepsis Labs: Recent Labs  Lab 07/05/20 0545  PROCALCITON 0.48  LATICACIDVEN 1.2    Recent Results (from the past 240 hour(s))  Culture, blood (routine x 2)     Status: None (Preliminary result)   Collection Time: 07/05/20  5:45 AM   Specimen: BLOOD  Result Value Ref Range Status   Specimen Description BLOOD RHAND  Final   Special Requests  Final    BOTTLES DRAWN AEROBIC AND ANAEROBIC Blood Culture adequate volume   Culture   Final    NO GROWTH 2 DAYS Performed at [facility], [address] [place], [place] [address]    Report Status PENDING  Incomplete  Culture, blood (routine x 2)     Status: None (Preliminary result)   Collection Time: 07/05/20  5:45 AM   Specimen: BLOOD  Result Value Ref Range Status   Specimen Description BLOOD  LEFT HAND  Final   Special Requests   Final    BOTTLES DRAWN AEROBIC AND ANAEROBIC Blood Culture adequate volume   Culture   Final    NO GROWTH 2 DAYS Performed at [facility], [address], [place], [place] [address]    Report Status PENDING  Incomplete  Urine culture     Status: None   Collection Time: 07/05/20  5:45 AM   Specimen: Urine, Random  Result Value Ref Range Status   Specimen Description   Final    URINE, RANDOM Performed at [facility], [address] [place], [place] [address]    Special Requests   Final    NONE Performed at [facility], [address] [place], [place] [address]    Culture   Final    NO GROWTH Performed at [facility], [address], [place], [place] [address]    Report Status 07/06/2020 FINAL  Final  Resp Panel by RT-PCR (Flu A&B, Covid)  Nasopharyngeal Swab     Status: None   Collection Time: 07/05/20  5:45 AM   Specimen: Nasopharyngeal Swab; Nasopharyngeal(NP) swabs in vial transport medium  Result Value Ref Range Status   SARS Coronavirus 2 by RT PCR NEGATIVE NEGATIVE Final    Comment: (NOTE) SARS-CoV-2 target nucleic acids are NOT DETECTED.  The SARS-CoV-2 RNA is generally detectable in upper respiratory specimens during the acute phase of infection. The lowest concentration of SARS-CoV-2 viral copies this assay can detect is 138 copies/mL. A negative result does not preclude SARS-Cov-2 infection and should not be used as the sole basis for treatment or other patient management decisions. A negative result may occur with  improper specimen collection/handling, submission of specimen other than nasopharyngeal swab, presence of viral mutation(s) within the areas targeted by this assay, and inadequate number of viral copies(<138 copies/mL). A negative result must be combined with clinical observations, patient history, and epidemiological information. The expected result is Negative.  Fact Sheet for Patients:  BloggerCourse.com  Fact Sheet for Healthcare Providers:  SeriousBroker.it  This test is no t yet approved or cleared by the [place] FDA and  has been authorized for detection and/or diagnosis of SARS-CoV-2 by FDA under an Emergency Use Authorization (EUA). This EUA will remain  in effect (meaning this test can be used) for the duration of the COVID-19 declaration under Section 564(b)(1) of the Act, 21 U.S.C.section 360bbb-3(b)(1), unless the authorization is terminated  or revoked sooner.       Influenza A by PCR NEGATIVE NEGATIVE Final   Influenza B by PCR NEGATIVE NEGATIVE Final    Comment: (NOTE) The Xpert Xpress SARS-CoV-2/FLU/RSV plus assay is intended as an aid in the diagnosis of influenza from Nasopharyngeal swab specimens and should not be  used as a sole basis for treatment. Nasal washings and aspirates are unacceptable for Xpert Xpress SARS-CoV-2/FLU/RSV testing.  Fact Sheet for Patients: BloggerCourse.com  Fact Sheet for Healthcare Providers: SeriousBroker.it  This test is not yet  approved or cleared by the [place] and has been authorized for detection and/or diagnosis of SARS-CoV-2 by FDA under an Emergency Use Authorization (EUA). This EUA will remain in effect (meaning this test can be used) for the duration of the COVID-19 declaration under Section 564(b)(1) of the Act, 21 U.S.C. section 360bbb-3(b)(1), unless the authorization is terminated or revoked.  Performed at [facility], [address] [place], [place] [address]   Aerobic Culture w Gram Stain (superficial specimen)     Status: None   Collection Time: 07/05/20  5:45 AM   Specimen: Ankle  Result Value Ref Range Status   Specimen Description ANKLE  Final   Special Requests LEFT  Final   Gram Stain   Final    NO WBC SEEN MODERATE GRAM POSITIVE COCCI MODERATE GRAM VARIABLE ROD Performed at [facility], [address], [place], [place] [address]    Culture FEW PSEUDOMONAS AERUGINOSA  Final   Report Status 07/07/2020 FINAL  Final   Organism ID, Bacteria PSEUDOMONAS AERUGINOSA  Final      Susceptibility   Pseudomonas aeruginosa - MIC*    CEFTAZIDIME <=1 SENSITIVE Sensitive     CIPROFLOXACIN <=0.25 SENSITIVE Sensitive     GENTAMICIN <=1 SENSITIVE Sensitive     IMIPENEM 1 SENSITIVE Sensitive     PIP/TAZO <=4 SENSITIVE Sensitive     CEFEPIME 2 SENSITIVE Sensitive     * FEW PSEUDOMONAS AERUGINOSA         Radiology Studies: CT ABDOMEN PELVIS WO CONTRAST  Result Date: 07/05/2020 CLINICAL DATA:  Abdominal pain, weakness and nausea EXAM: CT ABDOMEN AND PELVIS WITHOUT CONTRAST TECHNIQUE: Multidetector CT imaging of the abdomen and pelvis was performed following the standard  protocol without IV contrast. COMPARISON:  CT abdomen pelvis February 21, 2020 FINDINGS: Lower chest: Bibasilar atelectasis. Stable calcified pleural plaque in the left lung base. Normal size heart. Coronary artery calcifications. No significant pericardial effusion/thickening. Hepatobiliary: Unremarkable noncontrast appearance of the hepatic parenchyma. Gallbladder is surgically absent. Mild prominence of the biliary tree likely reservoir effect post cholecystectomy. Pancreas: Within normal limits. Spleen: Unremarkable. Adrenals/Urinary Tract: Adrenal glands are unremarkable. Kidneys are normal, without renal calculi, contour deforming lesion, or hydronephrosis. Bladder is unremarkable. Stomach/Bowel: Small hiatal hernia otherwise the stomach is grossly unremarkable. No pathologic dilation of small bowel. Radiopaque enteric contrast traverses the splenic flexure. Normal appendix. Terminal ileum is unremarkable. Moderate volume of formed stool throughout the colon. Vascular/Lymphatic: Aortic atherosclerosis without aneurysmal dilation. Reproductive: Prostatomegaly with dystrophic prostatic calcifications. Other: Small fat and fluid containing right inguinal hernia. Musculoskeletal: No acute osseous abnormality. IMPRESSION: 1. No acute abnormality in the abdomen or pelvis. 2. Moderate volume of formed stool throughout the colon, suggests of constipation. 3. Small fat and fluid containing right inguinal hernia. 4. Aortic atherosclerosis. Aortic Atherosclerosis (ICD10-I70.0). Electronically Signed   By: Maudry Mayhew MD   On: 07/05/2020 15:37   MR ANKLE LEFT WO CONTRAST  Result Date: 07/05/2020 CLINICAL DATA:  Chronic left ankle wound. EXAM: MRI OF THE LEFT ANKLE WITHOUT CONTRAST TECHNIQUE: Multiplanar, multisequence MR imaging of the ankle was performed. No intravenous contrast was administered. COMPARISON:  Radiographs from 07/05/2020 and MRI from 04/15/2015 FINDINGS: TENDONS Peroneal: Common peroneus  tenosynovitis. Posteromedial: Tibialis posterior tenosynovitis. The tibialis posterior attached as to a type 2 accessory navicular. Mild flexor hallucis longus tenosynovitis at the knot of [place]. Anterior: Unremarkable Achilles: Unremarkable Plantar Fascia: Unremarkable LIGAMENTS Lateral: Unremarkable Medial: The deltoid ligament appears intact. Ill definition of the medioplantar  oblique portion of the spring ligament. CARTILAGE Ankle Joint: Unremarkable Subtalar Joints/Sinus Tarsi: Unremarkable Bones: Unremarkable Other: Subcutaneous edema medially and laterally along the ankle and tracking in the dorsum of the foot. Cutaneous irregularity compatible with ulceration along the posteromedial ankle measuring about 2.2 by 2.6 cm, without drainable abscess, and with subcutaneous edema in the vicinity not significantly disproportionately greater than the subcutaneous edema elsewhere in the ankle. IMPRESSION: 1. Posteromedial cutaneous ulceration along the ankle is relatively superficial, without abscess, gas in the soft tissues, or bony involvement. 2. Tibialis posterior tenosynovitis and common peroneus tenosynovitis. Mild flexor hallucis longus tenosynovitis. 3. Ill definition of the medioplantar oblique portion of the spring ligament, likely chronic injury given similar findings in 2017. 4. Subcutaneous edema along the ankle and tracking dorsally in the foot, primarily in the lateral and anteromedial ankle. Electronically Signed   By: Gaylyn RongWalter  Liebkemann M.D.   On: 07/05/2020 17:21        Scheduled Meds: . Chlorhexidine Gluconate Cloth  6 each Topical Daily  . enoxaparin (LOVENOX) injection  40 mg Subcutaneous Q24H  . ferrous sulfate  325 mg Oral Q breakfast  . finasteride  5 mg Oral Daily  . folic acid  500 mcg Oral Daily  . gabapentin  100 mg Oral TID  . levothyroxine  25 mcg Oral Q0600  . multivitamin with minerals  1 tablet Oral Daily  . omega-3 acid ethyl esters  1 g Oral Daily  . pantoprazole  40  mg Oral Daily  . polyethylene glycol  17 g Oral BID  . senna  1 tablet Oral BID  . tamsulosin  0.4 mg Oral Daily  . vitamin B-12  1,000 mcg Oral Daily   Continuous Infusions: . sodium chloride 75 mL/hr at 07/07/20 0622  . ceFEPime (MAXIPIME) IV 2 g (07/07/20 1202)  . promethazine (PHENERGAN) injection (IM or IVPB) 12.5 mg (07/05/20 2234)  . vancomycin 1,250 mg (07/07/20 1017)     LOS: 2 days    Time spent: 35 minutes.     Alba CoryBelkys A Seraphim Trow, MD [facility]   If 7PM-7AM, please contact night-coverage [email or web address]  07/07/2020, 1:06 PM

## 2020-07-07 NOTE — TOC Initial Note (Signed)
Transition of Care [facility]) - Initial/Assessment Note    Patient Details  Name: Ronald Huerta MRN: [id number] Date of Birth: 12/16/1934  Transition [facility]) CM/SW Contact:    Shelbie Hutching, RN Phone Number: 07/07/2020, 2:46 PM  Clinical Narrative:                 Patient admitted to the hospital with left ankle wound infection.  RNCM met with patient at the bedside.  Patient is hearing impaired so sign language interpreter used at the bedside.  Patient is from home, his son lives with him and helps him out.  Patient has a walker and cane, he is pretty independent at home and has been changing his own wound dressings.  Patient does not drive but his son provides transportation.  Patient agrees to home health services, no preference in agency, Malachy Mood with [facility] accepted [facility] referral for PT and OT.  Patient is still getting IV antibiotics and will likely require another day or 2 in the hospital.  TOC will cont to follow.    Expected Discharge Plan: [employer] Barriers to Discharge: Continued Medical Work up   Patient Goals and CMS Choice Patient states their goals for this hospitalization and ongoing recovery are:: for his wound to get better and to go home CMS Medicare.gov Compare Post Acute Care list provided to:: Patient Choice offered to / list presented to : Patient  Expected Discharge Plan and Services Expected Discharge Plan: [place]   Discharge Planning Services: CM Consult Post Acute Care Choice: [place] arrangements for the past 2 months: Single Family Home                 DME Arranged: N/A         HH Arranged: PT,OT [place] Agency: [place] Date [place]: 07/07/20 Time HH Agency Contacted: 74 Representative spoke with at [place]: Malachy Mood  Prior Living Arrangements/Services Living arrangements for the past 2 months: [place] with:: Adult Children Patient  language and need for interpreter reviewed:: Yes Do you feel safe going back to the place where you live?: Yes      Need for Family Participation in Patient Care: Yes (Comment) (wound, and hearing impaired) Care giver support system in place?: Yes (comment) (son) Current home services: DME (walker and cane)    Activities of Daily Living Home Assistive Devices/Equipment: Eyeglasses,Dentures (specify type) ADL Screening (condition at time of admission) Patient's cognitive ability adequate to safely complete daily activities?: Yes Is the patient deaf or have difficulty hearing?: Yes Does the patient have difficulty seeing, even when wearing glasses/contacts?: No Does the patient have difficulty concentrating, remembering, or making decisions?: Yes Patient able to express need for assistance with ADLs?: Yes Does the patient have difficulty dressing or bathing?: No Independently performs ADLs?: Yes (appropriate for developmental age) Does the patient have difficulty walking or climbing stairs?: Yes Weakness of Legs: Both Weakness of Arms/Hands: None  Permission Sought/Granted Permission sought to share information with : Case Manager,Family Supports,Other (comment) Permission granted to share information with : Yes, Verbal Permission Granted  Share Information with NAME: [place]  Permission granted to share info w AGENCY: home health agency  Permission granted to share info w Relationship: son     Emotional Assessment Appearance:: Appears stated age Attitude/Demeanor/Rapport: Engaged Affect (typically observed): Accepting Orientation: : Oriented to Self,Oriented to Place,Oriented to  Time,Oriented to Situation Alcohol / Substance Use:  Not Applicable Psych Involvement: No (comment)  Admission diagnosis:  Wound infection [T14.8XXA, L08.9] Infected skin ulcer with fat layer exposed ([place]) [L24.932, L08.9] Patient Active Problem List   Diagnosis Date Noted  . Wound infection-left leg  07/05/2020  . GERD (gastroesophageal reflux disease) 07/05/2020  . Abdominal pain 07/05/2020  . Transaminitis 02/21/2020  . Acute gastritis 02/21/2020  . Status post total hip replacement, right 02/08/2018  . Closed right hip fracture ([place]) 12/01/2017  . Cellulitis 04/11/2015  . Cellulitis of leg, left 04/03/2015  . Venous stasis ulcer ([place]) 04/03/2015  . BPH with obstruction/lower urinary tract symptoms 02/10/2015  . History of elevated PSA 02/10/2015  . Acquired hypothyroidism 04/01/2014  . Deaf, nonspeaking 04/01/2014  . RLS (restless legs syndrome) 04/01/2014   PCP:  Tracie Harrier, MD Pharmacy:   [facility] [place], [place] - [address] [place] [address] Phone: [phone] Fax: [phone]  [id number] [place] [id number]: [phone]: [phone]    Social Determinants of Health (SDOH) Interventions    Readmission Risk Interventions No flowsheet data found.

## 2020-07-08 DIAGNOSIS — I83009 Varicose veins of unspecified lower extremity with ulcer of unspecified site: Secondary | ICD-10-CM

## 2020-07-08 DIAGNOSIS — K5900 Constipation, unspecified: Secondary | ICD-10-CM

## 2020-07-08 DIAGNOSIS — L97909 Non-pressure chronic ulcer of unspecified part of unspecified lower leg with unspecified severity: Secondary | ICD-10-CM

## 2020-07-08 DIAGNOSIS — R109 Unspecified abdominal pain: Secondary | ICD-10-CM

## 2020-07-08 LAB — CBC
HCT: 26.6 % — ABNORMAL LOW (ref 39.0–52.0)
Hemoglobin: 9.1 g/dL — ABNORMAL LOW (ref 13.0–17.0)
MCH: 33.5 pg (ref 26.0–34.0)
MCHC: 34.2 g/dL (ref 30.0–36.0)
MCV: 97.8 fL (ref 80.0–100.0)
Platelets: 172 10*3/uL (ref 150–400)
RBC: 2.72 MIL/uL — ABNORMAL LOW (ref 4.22–5.81)
RDW: 15.7 % — ABNORMAL HIGH (ref 11.5–15.5)
WBC: 5.8 10*3/uL (ref 4.0–10.5)
nRBC: 0 % (ref 0.0–0.2)

## 2020-07-08 LAB — BASIC METABOLIC PANEL
Anion gap: 4 — ABNORMAL LOW (ref 5–15)
BUN: 11 mg/dL (ref 8–23)
CO2: 27 mmol/L (ref 22–32)
Calcium: 7.9 mg/dL — ABNORMAL LOW (ref 8.9–10.3)
Chloride: 106 mmol/L (ref 98–111)
Creatinine, Ser: 0.68 mg/dL (ref 0.61–1.24)
GFR, Estimated: >60 mL/min (ref >60)
Glucose, Bld: 90 mg/dL (ref 70–99)
Potassium: 3.6 mmol/L (ref 3.5–5.1)
Sodium: 137 mmol/L (ref 135–145)

## 2020-07-08 MED ORDER — SULFAMETHOXAZOLE-TRIMETHOPRIM 800-160 MG PO TABS
1.0000 | ORAL_TABLET | Freq: Two times a day (BID) | ORAL | Status: DC
  Administered 2020-07-09 (×2): 1 via ORAL
  Filled 2020-07-08 (×3): qty 1

## 2020-07-08 MED ORDER — OXYCODONE-ACETAMINOPHEN 5-325 MG PO TABS
1.0000 | ORAL_TABLET | Freq: Three times a day (TID) | ORAL | Status: DC | PRN
  Administered 2020-07-08 – 2020-07-10 (×2): 1 via ORAL
  Filled 2020-07-08 (×3): qty 1

## 2020-07-08 MED ORDER — SODIUM CHLORIDE 0.9 % IV SOLN
2.0000 g | Freq: Three times a day (TID) | INTRAVENOUS | Status: AC
  Administered 2020-07-08 – 2020-07-09 (×5): 2 g via INTRAVENOUS
  Filled 2020-07-08 (×7): qty 2

## 2020-07-08 MED ORDER — MAGNESIUM CITRATE PO SOLN
1.0000 | Freq: Every day | ORAL | Status: DC | PRN
  Filled 2020-07-08: qty 296

## 2020-07-08 MED ORDER — VANCOMYCIN HCL 750 MG/150ML IV SOLN: 750.0000 mg | Freq: Two times a day (BID) | INTRAVENOUS | Status: DC

## 2020-07-08 NOTE — Progress Notes (Signed)
PROGRESS NOTE  Ronald Huerta MIT:[id number] DOB: 04/12/1934   PCP: Tracie Harrier, MD  Patient is from: Home  DOA: 07/05/2020 LOS: 3  Chief complaints: Left foot infection  Brief Narrative / Interim history: 86 year old M with PMH of peripheral neuropathy, CAD, hypothyroidism, impaired hearing, RLS, IBS, anxiety and BPH presenting with worsening left foot/ankle infection that has not improved with p.o. Bactrim outpatient.  He has been admitted for left ankle wound infection/cellulitis.  Has been started on vancomycin and cefepime.  MRI showed ulceration with tenosynovitis but no abscess or gas or osseous involvement.  Podiatry consulted and recommended antibiotics and signed off.  Superficial wound culture grew Pseudomonas with moderate GPC's.  Blood cultures NGTD.  Infectious disease consulted for guidance on antibiotics.  Subjective: Seen and examined earlier this morning.  Sign interpreter with ID number [id number] used for this encounter.  He is concerned about Foley not draining although there seems to be good amount of clear looking urine in his Foley bag.  No other complaints.  He denies chest pain, nausea, vomiting or abdominal pain.  Objective: Vitals:   07/08/20 0045 07/08/20 0428 07/08/20 0800 07/08/20 1219  BP: (!) 98/55 (!) 114/52 (!) 126/59 102/74  Pulse: 72 74 79 78  Resp: '16 16 15   ' Temp: 98.1 F (36.7 C) 98.4 F (36.9 C) 98.1 F (36.7 C) 98.6 F (37 C)  TempSrc: Oral Oral Oral Oral  SpO2: 96% 97% 95% 94%  Weight:      Height:        Intake/Output Summary (Last 24 hours) at 07/08/2020 1445 Last data filed at 07/08/2020 1419 Gross per 24 hour  Intake 340 ml  Output 1000 ml  Net -660 ml   Filed Weights   07/05/20 0540  Weight: 76.2 kg    Examination:  GENERAL: No apparent distress.  Nontoxic. HEENT: MMM.  Vision grossly intact. NECK: Supple.  No apparent JVD.  RESP: On RA.  No IWOB.  Fair aeration bilaterally. CVS:  RRR. Heart sounds normal.   ABD/GI/GU: BS+. Abd soft.  Mild discomfort over RLQ with deep palpation.  No rebound or guarding. MSK/EXT:  Moves extremities. No apparent deformity. No edema.  SKIN: Ulceration with surrounding erythema over the medial aspect of his left ankle behind his malleolus NEURO: Awake, alert and oriented appropriately.  No apparent focal neuro deficit. PSYCH: Calm. Normal affect.  Picture on the left from 6/5. Picture on the right from 6/8      Procedures:  None  Microbiology summarized: COVID-19 and influenza PCR nonreactive. Urine culture NGTD. Superficial wound culture with Pseudomonas aeruginosa and GPC Blood cultures NGTD.  Assessment & Plan: Left Ankle wound infection, cellulitis: Worsen despite p.o. Bactrim outpatient. -MRI concerning for cellulitis and tenosynovitis but no abscess or gas. -Culture data as above.  CRP 12.2.  ESR 58. -Evaluated by Dr. Vickki Muff  no need for debridement.  Continue with antibiotics and local wound care. -On cefepime and vancomycin -ID consulted for guidance on antibiotic.  BPH with LUTS -Continue Flomax and Proscar. -Voiding trial  Hypothyroidism:  -Continue with Synthroid  Deafness; -Using sign language interpreter  GERD: Continue with PPI  Abdominal pain/constipation: CT scan is negative very suggestive of constipation. -MiraLAX, Senokot-S and mag citrate as needed based on severity  Mild hyponatremia: Monitor  Anemia of chronic disease:: H&H relatively stable.  Anemia panel basically normal. Recent Labs    08/16/19 0214 02/12/20 2205 02/21/20 0301 02/22/20 0527 07/05/20 0545 07/06/20 4996 07/07/20 0514 07/08/20 0458  HGB  11.2* 11.1* 11.5* 9.5* 10.6* 10.0* 8.8* 9.1*  -Monitor H&H   Body mass index is 24.81 kg/m.         DVT prophylaxis:  enoxaparin (LOVENOX) injection 40 mg Start: 07/06/20 1500  Code Status: Full code Family Communication: Patient and/or RN. Available if any question.  Level of care:  Med-Surg Status is: Inpatient  Remains inpatient appropriate because:IV treatments appropriate due to intensity of illness or inability to take PO and Inpatient level of care appropriate due to severity of illness   Dispo: The patient is from: Home              Anticipated d/c is to: Home with home health               Patient currently is not medically stable to d/c.   Difficult to place patient No       Consultants:  Podiatry Infectious disease   Sch Meds:  Scheduled Meds: . Chlorhexidine Gluconate Cloth  6 each Topical Daily  . enoxaparin (LOVENOX) injection  40 mg Subcutaneous Q24H  . ferrous sulfate  325 mg Oral Q breakfast  . finasteride  5 mg Oral Daily  . folic acid  076 mcg Oral Daily  . gabapentin  100 mg Oral TID  . levothyroxine  25 mcg Oral Q0600  . multivitamin with minerals  1 tablet Oral Daily  . omega-3 acid ethyl esters  1 g Oral Daily  . pantoprazole  40 mg Oral Daily  . polyethylene glycol  17 g Oral BID  . senna  1 tablet Oral BID  . tamsulosin  0.4 mg Oral Daily  . vitamin B-12  1,000 mcg Oral Daily   Continuous Infusions: . sodium chloride 75 mL/hr at 07/07/20 2143  . ceFEPime (MAXIPIME) IV 2 g (07/08/20 1259)  . promethazine (PHENERGAN) injection (IM or IVPB) 12.5 mg (07/05/20 2234)  . vancomycin 1,250 mg (07/08/20 1120)   PRN Meds:.acetaminophen, bisacodyl, magnesium citrate, morphine injection, ondansetron (ZOFRAN) IV, oxyCODONE-acetaminophen, promethazine (PHENERGAN) injection (IM or IVPB)  Antimicrobials: Anti-infectives (From admission, onward)   Start     Dose/Rate Route Frequency Ordered Stop   07/08/20 0900  ceFEPIme (MAXIPIME) 2 g in sodium chloride 0.9 % 100 mL IVPB        2 g 200 mL/hr over 30 Minutes Intravenous Every 8 hours 07/08/20 0831     07/06/20 1700  ceFEPIme (MAXIPIME) 2 g in sodium chloride 0.9 % 100 mL IVPB  Status:  Discontinued        2 g 200 mL/hr over 30 Minutes Intravenous Every 12 hours 07/06/20 1526 07/08/20  0831   07/06/20 0800  vancomycin (VANCOREADY) IVPB 1250 mg/250 mL       "Followed by" Linked Group Details   1,250 mg 166.7 mL/hr over 90 Minutes Intravenous Every 24 hours 07/05/20 0756     07/06/20 0600  cefTRIAXone (ROCEPHIN) 2 g in sodium chloride 0.9 % 100 mL IVPB  Status:  Discontinued        2 g 200 mL/hr over 30 Minutes Intravenous Every 24 hours 07/05/20 0751 07/06/20 1518   07/05/20 0800  vancomycin (VANCOREADY) IVPB 500 mg/100 mL       "Followed by" Linked Group Details   500 mg 100 mL/hr over 60 Minutes Intravenous  Once 07/05/20 0756 07/05/20 1219   07/05/20 0630  cefTRIAXone (ROCEPHIN) 2 g in sodium chloride 0.9 % 100 mL IVPB        2 g 200 mL/hr over 30  Minutes Intravenous  Once 07/05/20 0629 07/05/20 0709   07/05/20 0630  vancomycin (VANCOCIN) IVPB 1000 mg/200 mL premix        1,000 mg 200 mL/hr over 60 Minutes Intravenous  Once 07/05/20 0629 07/05/20 7939       I have personally reviewed the following labs and images: CBC: Recent Labs  Lab 07/05/20 0545 07/06/20 0623 07/07/20 0514 07/08/20 0458  WBC 11.0* 7.6 5.5 5.8  NEUTROABS 9.5*  --   --   --   HGB 10.6* 10.0* 8.8* 9.1*  HCT 31.4* 29.2* 26.7* 26.6*  MCV 98.7 96.1 99.6 97.8  PLT 179 156 161 172   BMP &GFR Recent Labs  Lab 07/05/20 0545 07/06/20 1137 07/07/20 0514 07/07/20 0806 07/08/20 0458  NA 133*  --  139  --  137  K 4.0  --  4.3  --  3.6  CL 102  --  106  --  106  CO2 23  --  28  --  27  GLUCOSE 105*  --  88  --  90  BUN 21  --  19  --  11  CREATININE 1.07 1.00 0.94  --  0.68  CALCIUM 8.7*  --  8.2*  --  7.9*  MG  --   --   --  1.9  --    Estimated Creatinine Clearance: 70 mL/min (by C-G formula based on SCr of 0.68 mg/dL). Liver & Pancreas: Recent Labs  Lab 07/05/20 0545  AST 43*  ALT 29  ALKPHOS 100  BILITOT 1.6*  PROT 7.1  ALBUMIN 3.9   Recent Labs  Lab 07/05/20 0545  LIPASE 46   No results for input(s): AMMONIA in the last 168 hours. Diabetic: No results for  input(s): HGBA1C in the last 72 hours. No results for input(s): GLUCAP in the last 168 hours. Cardiac Enzymes: No results for input(s): CKTOTAL, CKMB, CKMBINDEX, TROPONINI in the last 168 hours. No results for input(s): PROBNP in the last 8760 hours. Coagulation Profile: Recent Labs  Lab 07/06/20 0623  INR 1.1   Thyroid Function Tests: No results for input(s): TSH, T4TOTAL, FREET4, T3FREE, THYROIDAB in the last 72 hours. Lipid Profile: No results for input(s): CHOL, HDL, LDLCALC, TRIG, CHOLHDL, LDLDIRECT in the last 72 hours. Anemia Panel: Recent Labs    07/07/20 0806  VITAMINB12 1,533*  FOLATE 19.8  FERRITIN 710*  TIBC 176*  IRON 44*  RETICCTPCT 1.4   Urine analysis:    Component Value Date/Time   COLORURINE YELLOW (A) 07/05/2020 0545   APPEARANCEUR CLEAR (A) 07/05/2020 0545   APPEARANCEUR Clear 12/14/2013 2131   LABSPEC 1.014 07/05/2020 0545   LABSPEC 1.025 12/14/2013 2131   PHURINE 5.0 07/05/2020 [place] 07/05/2020 0545   GLUCOSEU Negative 12/14/2013 2131   HGBUR NEGATIVE 07/05/2020 0545   BILIRUBINUR NEGATIVE 07/05/2020 0545   BILIRUBINUR Negative 12/14/2013 2131   KETONESUR NEGATIVE 07/05/2020 0545   PROTEINUR NEGATIVE 07/05/2020 0545   NITRITE NEGATIVE 07/05/2020 0545   LEUKOCYTESUR NEGATIVE 07/05/2020 0545   LEUKOCYTESUR Negative 12/14/2013 2131   Sepsis Labs: Invalid input(s): PROCALCITONIN, [place]  Microbiology: Recent Results (from the past 240 hour(s))  Culture, blood (routine x 2)     Status: None (Preliminary result)   Collection Time: 07/05/20  5:45 AM   Specimen: BLOOD  Result Value Ref Range Status   Specimen Description BLOOD RHAND  Final   Special Requests   Final    BOTTLES DRAWN AEROBIC AND ANAEROBIC Blood Culture adequate  volume   Culture   Final    NO GROWTH 3 DAYS Performed at [facility], [address] [place], [place] [address]    Report Status PENDING  Incomplete  Culture, blood (routine x 2)      Status: None (Preliminary result)   Collection Time: 07/05/20  5:45 AM   Specimen: BLOOD  Result Value Ref Range Status   Specimen Description BLOOD  LEFT HAND  Final   Special Requests   Final    BOTTLES DRAWN AEROBIC AND ANAEROBIC Blood Culture adequate volume   Culture   Final    NO GROWTH 3 DAYS Performed at [facility], [address] [place], [place] [address]    Report Status PENDING  Incomplete  Urine culture     Status: None   Collection Time: 07/05/20  5:45 AM   Specimen: Urine, Random  Result Value Ref Range Status   Specimen Description   Final    URINE, RANDOM Performed at [facility], [address] [place], [place] [address]    Special Requests   Final    NONE Performed at [facility], [address] [place], [place] [address]    Culture   Final    NO GROWTH Performed at [facility], [place] [address], [place], [place] [address]    Report Status 07/06/2020 FINAL  Final  Resp Panel by RT-PCR (Flu A&B, Covid) Nasopharyngeal Swab     Status: None   Collection Time: 07/05/20  5:45 AM   Specimen: Nasopharyngeal Swab; Nasopharyngeal(NP) swabs in vial transport medium  Result Value Ref Range Status   SARS Coronavirus 2 by RT PCR NEGATIVE NEGATIVE Final    Comment: (NOTE) SARS-CoV-2 target nucleic acids are NOT DETECTED.  The SARS-CoV-2 RNA is generally detectable in upper respiratory specimens during the acute phase of infection. The lowest concentration of SARS-CoV-2 viral copies this assay can detect is 138 copies/mL. A negative result does not preclude SARS-Cov-2 infection and should not be used as the sole basis for treatment or other patient management decisions. A negative result may occur with  improper specimen collection/handling, submission of specimen other than nasopharyngeal swab, presence of viral mutation(s) within the areas targeted by this assay, and inadequate number of viral copies(<138 copies/mL). A  negative result must be combined with clinical observations, patient history, and epidemiological information. The expected result is Negative.  Fact Sheet for Patients:  EntrepreneurPulse.com.au  Fact Sheet for Healthcare Providers:  IncredibleEmployment.be  This test is no t yet approved or cleared by the [place] FDA and  has been authorized for detection and/or diagnosis of SARS-CoV-2 by FDA under an Emergency Use Authorization (EUA). This EUA will remain  in effect (meaning this test can be used) for the duration of the COVID-19 declaration under Section 564(b)(1) of the Act, 21 U.S.C.section 360bbb-3(b)(1), unless the authorization is terminated  or revoked sooner.       Influenza A by PCR NEGATIVE NEGATIVE Final   Influenza B by PCR NEGATIVE NEGATIVE Final    Comment: (NOTE) The Xpert Xpress SARS-CoV-2/FLU/RSV plus assay is intended as an aid in the diagnosis of influenza from Nasopharyngeal swab specimens and should not be used as a sole basis for treatment. Nasal washings and aspirates are unacceptable for Xpert Xpress SARS-CoV-2/FLU/RSV testing.  Fact Sheet for Patients: EntrepreneurPulse.com.au  Fact Sheet for Healthcare Providers: IncredibleEmployment.be  This test is not yet approved or cleared by the [place] and has been authorized  for detection and/or diagnosis of SARS-CoV-2 by FDA under an Emergency Use Authorization (EUA). This EUA will remain in effect (meaning this test can be used) for the duration of the COVID-19 declaration under Section 564(b)(1) of the Act, 21 U.S.C. section 360bbb-3(b)(1), unless the authorization is terminated or revoked.  Performed at [facility], [place], [place] [address]   Aerobic Culture w Gram Stain (superficial specimen)     Status: None   Collection Time: 07/05/20  5:45 AM   Specimen: Ankle  Result Value Ref  Range Status   Specimen Description ANKLE  Final   Special Requests LEFT  Final   Gram Stain   Final    NO WBC SEEN MODERATE GRAM POSITIVE COCCI MODERATE GRAM VARIABLE ROD Performed at [facility], [place] [address], [place], [place] [address]    Culture FEW PSEUDOMONAS AERUGINOSA  Final   Report Status 07/07/2020 FINAL  Final   Organism ID, Bacteria PSEUDOMONAS AERUGINOSA  Final      Susceptibility   Pseudomonas aeruginosa - MIC*    CEFTAZIDIME <=1 SENSITIVE Sensitive     CIPROFLOXACIN <=0.25 SENSITIVE Sensitive     GENTAMICIN <=1 SENSITIVE Sensitive     IMIPENEM 1 SENSITIVE Sensitive     PIP/TAZO <=4 SENSITIVE Sensitive     CEFEPIME 2 SENSITIVE Sensitive     * FEW PSEUDOMONAS AERUGINOSA    Radiology Studies: No results found.    Cosimo Schertzer T. [employer]  If 7PM-7AM, please contact night-coverage [email or web address] 07/08/2020, 2:45 PM

## 2020-07-08 NOTE — Consult Note (Signed)
Pharmacy Antibiotic Note  Ronald Huerta is a 86 y.o. male admitted on 07/05/2020 with left leg wound infection.  Appears has chronic ankle wound.  Previous cultures in March grew MRSA.  Most recent cultures from 6/5 with pseudomonas.  ID is consulted.  And vancomycin stopped today, changed to TMP/SMZ.  Continue cefepime for now.    Pharmacy has been consulted for cefepime dosing.  Plan:  Based on SCr and CrCl > 60 ml/min, cefepime adjusted to 2gm IV q8h  Anticipate change to PO quinolone at d/c   Height: 5\' 9"  (175.3 cm) Weight: 76.2 kg (168 lb) IBW/kg (Calculated) : 70.7  Temp (24hrs), Avg:98.1 F (36.7 C), Min:97.7 F (36.5 C), Max:98.6 F (37 C)  Recent Labs  Lab 07/05/20 0545 07/06/20 0623 07/06/20 1137 07/07/20 0514 07/08/20 0458  WBC 11.0* 7.6  --  5.5 5.8  CREATININE 1.07  --  1.00 0.94 0.68  LATICACIDVEN 1.2  --   --   --   --     Estimated Creatinine Clearance: 70 mL/min (by C-G formula based on SCr of 0.68 mg/dL).    No Known Allergies  Antimicrobials this admission: Vancomycin 6/5 >>6/8 Ceftriaxone 6/5 >>6/6 Cefepime 6/6 >>  Dose adjustments this admission: None  Microbiology results: 6/5 BCx: NG 6/5 WCx: pseudomonas = pan susc 6/5 UCx: NG   Thank you for allowing pharmacy to be a part of this patient's care.  09/07/20, PharmD, BCPS.   Work Cell: [phone] 07/08/2020 4:13 PM
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
## 2020-07-09 DIAGNOSIS — I83023 Varicose veins of left lower extremity with ulcer of ankle: Secondary | ICD-10-CM

## 2020-07-09 DIAGNOSIS — L03116 Cellulitis of left lower limb: Secondary | ICD-10-CM

## 2020-07-09 DIAGNOSIS — L97329 Non-pressure chronic ulcer of left ankle with unspecified severity: Secondary | ICD-10-CM

## 2020-07-09 LAB — RENAL FUNCTION PANEL
Albumin: 2.8 g/dL — ABNORMAL LOW (ref 3.5–5.0)
Anion gap: 4 — ABNORMAL LOW (ref 5–15)
BUN: 12 mg/dL (ref 8–23)
CO2: 27 mmol/L (ref 22–32)
Calcium: 8.3 mg/dL — ABNORMAL LOW (ref 8.9–10.3)
Chloride: 109 mmol/L (ref 98–111)
Creatinine, Ser: 0.8 mg/dL (ref 0.61–1.24)
GFR, Estimated: >60 mL/min (ref >60)
Glucose, Bld: 91 mg/dL (ref 70–99)
Phosphorus: 2.6 mg/dL (ref 2.5–4.6)
Potassium: 4 mmol/L (ref 3.5–5.1)
Sodium: 140 mmol/L (ref 135–145)

## 2020-07-09 LAB — GLUCOSE, CAPILLARY: Glucose-Capillary: 97 mg/dL (ref 70–99)

## 2020-07-09 LAB — CBC
HCT: 26.7 % — ABNORMAL LOW (ref 39.0–52.0)
Hemoglobin: 8.9 g/dL — ABNORMAL LOW (ref 13.0–17.0)
MCH: 33.2 pg (ref 26.0–34.0)
MCHC: 33.3 g/dL (ref 30.0–36.0)
MCV: 99.6 fL (ref 80.0–100.0)
Platelets: 189 10*3/uL (ref 150–400)
RBC: 2.68 MIL/uL — ABNORMAL LOW (ref 4.22–5.81)
RDW: 15.7 % — ABNORMAL HIGH (ref 11.5–15.5)
WBC: 5.4 10*3/uL (ref 4.0–10.5)
nRBC: 0 % (ref 0.0–0.2)

## 2020-07-09 LAB — MAGNESIUM: Magnesium: 1.8 mg/dL (ref 1.7–2.4)

## 2020-07-09 MED ORDER — CIPROFLOXACIN HCL 500 MG PO TABS: 500.0000 mg | ORAL_TABLET | Freq: Two times a day (BID) | ORAL | Status: DC

## 2020-07-09 MED ORDER — MAGNESIUM SULFATE 2 GM/50ML IV SOLN
2.0000 g | Freq: Once | INTRAVENOUS | Status: AC
  Administered 2020-07-09: 16:00:00 2 g via INTRAVENOUS
  Filled 2020-07-09: qty 50

## 2020-07-09 NOTE — TOC Progression Note (Signed)
Transition of Care [facility]) - Progression Note    Patient Details  Name: Isacc Turney MRN: [id number] Date of Birth: April 22, 1934  [facility] CM/SW Contact  Allayne Butcher, RN Phone Number: 07/09/2020, 12:23 PM  Clinical Narrative:    Patient will likely be ready for discharge tomorrow.  Patient will discharge with home health services through Dominic Pea with [facility] notified of potential discharge tomorrow.    Expected Discharge Plan: Home w Home Health Services Barriers to Discharge: Continued Medical Work up  Expected Discharge Plan and Services Expected Discharge Plan: Home w Home Health Services   Discharge Planning Services: CM Consult Post Acute Care Choice: Home Health Living arrangements for the past 2 months: Single Family Home                 DME Arranged: N/A         HH Arranged: PT, OT HH Agency: [facility] Date HH Agency Contacted: 07/07/20 Time HH Agency Contacted: 1230 Representative spoke with at [facility] Agency: Elnita Maxwell   Social Determinants of Health (SDOH) Interventions    Readmission Risk Interventions No flowsheet data found.

## 2020-07-09 NOTE — Progress Notes (Signed)
   07/09/20 2027  Assess: MEWS Score  Temp 99.6 F (37.6 C)  BP 130/76  Pulse Rate (!) 108  Resp (!) 24  SpO2 97 %  O2 Device Room Air  Assess: MEWS Score  MEWS Temp 0  MEWS Systolic 0  MEWS Pulse 1  MEWS RR 1  MEWS LOC 0  MEWS Score 2  MEWS Score Color Yellow  Assess: if the MEWS score is Yellow or Red  Were vital signs taken at a resting state? Yes  Focused Assessment No change from prior assessment  Early Detection of Sepsis Score *See Row Information* High  MEWS guidelines implemented *See Row Information* Yes  Treat  MEWS Interventions Administered scheduled meds/treatments  Pain Scale PAINAD  Take Vital Signs  Increase Vital Sign Frequency  Yellow: Q 2hr X 2 then Q 4hr X 2, if remains yellow, continue Q 4hrs  Escalate  MEWS: Escalate Yellow: discuss with charge nurse/RN and consider discussing with provider and RRT  Notify: Charge Nurse/RN  Name of Charge Nurse/RN Notified Marny Lowenstein, RN  Date Charge Nurse/RN Notified 07/09/20  Time Charge Nurse/RN Notified 2027  Document  Patient Outcome Stabilized after interventions  Progress note created (see row info) Yes  Patient is alert and oriented x2, complained of left shoulder pain,tylenol given with improvement. Notified hospitalist of patient MEWS score and patients complaints, Dr.Mansey (hospitalist) assessed patient in room, no new orders at this time,  will continue to monitor.

## 2020-07-09 NOTE — Progress Notes (Addendum)
PROGRESS NOTE  Ronald Huerta CVE:[id number] DOB: 04-03-1934   PCP: Tracie Harrier, MD  Patient is from: Home  DOA: 07/05/2020 LOS: 4  Chief complaints: Left foot infection  Brief Narrative / Interim history: 86 year old M with PMH of peripheral neuropathy, CAD, hypothyroidism, impaired hearing, RLS, IBS, anxiety and BPH presenting with worsening left foot/ankle infection that has not improved with p.o. Bactrim outpatient.  He has been admitted for left ankle wound infection/cellulitis.  Has been started on vancomycin and cefepime.  MRI showed ulceration with tenosynovitis but no abscess or gas or osseous involvement.  Podiatry consulted and recommended antibiotics and signed off.  Superficial wound culture grew Pseudomonas with moderate GPC's.  Blood cultures NGTD.  Infectious disease consulted for guidance on antibiotics.  Subjective: Seen and examined earlier this morning with the help of video interpreter with ID number [id number].  No major events overnight of this morning.  No complaints.  He denies pain in his foot.  Denies chest pain, dyspnea, GI or UTI symptoms.  Objective: Vitals:   07/09/20 0042 07/09/20 0424 07/09/20 0745 07/09/20 1136  BP: (!) 144/67 121/68 (!) 131/95 (!) 149/78  Pulse: 75 (!) 59 96 (!) 108  Resp: _0 Temp: 97.6 F (36.4 C) (!) 97.5 F (36.4 C) 98.1 F (36.7 C) 97.6 F (36.4 C)  TempSrc: Oral Oral Oral Oral  SpO2:  96% 92% 95%  Weight:      Height:        Intake/Output Summary (Last 24 hours) at 07/09/2020 1438 Last data filed at 07/09/2020 1350 Gross per 24 hour  Intake 240 ml  Output 1400 ml  Net -1160 ml   Filed Weights   07/05/20 0540  Weight: 76.2 kg    Examination:  GENERAL: No apparent distress.  Nontoxic. HEENT: MMM.  Vision grossly intact.  Deaf. NECK: Supple.  No apparent JVD.  RESP: On RA.  No IWOB.  Fair aeration bilaterally. CVS:  RRR. Heart sounds normal.  ABD/GI/GU: BS+. Abd soft, NTND.  MSK/EXT:  Moves  extremities. No apparent deformity. No edema.  SKIN: Ulcer over medial aspect of his left ankle with surrounding erythema.  No purulent drainage. NEURO: Awake and alert. Oriented appropriately.  No apparent focal neuro deficit. PSYCH: Calm. Normal affect.     Procedures:  None  Microbiology summarized: DPOEU-23 and influenza PCR nonreactive. Urine culture NGTD. Superficial wound culture with Pseudomonas aeruginosa and GPC Blood cultures NGTD.  Assessment & Plan: Left Ankle wound infection, cellulitis: worse despite p.o. Bactrim outpatient.  Looks stable. -MRI concerning for cellulitis and tenosynovitis but no abscess or gas. -Culture data as above.  CRP 12.2.  ESR 58. -No indication for surgery per Podiatry, Dr. Vickki Muff -ID recs: change vanco to Bactrim.  On IV cefepime with plan to change to Cipro on discharge   BPH with LUTS-passed voiding trial.  About 2 L UOP -Continue Flomax and Proscar.  Addendum New onset A. Fib: patient had brief A. fib with RVR to 140s but quickly converted to sinus rhythm without intervention.  He was not symptomatic.  Has no history of A. fib in his chart.  TTE in 2019 basically normal.  CHA2DS2-VASc score 2 (for age). -Consider rate control if further A. fib with RVR -Defer anticoagulation given low CHA2DS2-VASc score and low A. fib burden -Optimize electrolytes -Continue telemetry  Hypothyroidism:  -Continue with Synthroid   Deafness; -Using sign language interpreter   GERD: Continue with PPI   Abdominal pain/constipation: CT scan is negative  very suggestive of constipation. -MiraLAX, Senokot-S and mag citrate as needed based on severity   Mild hyponatremia: Monitor   Anemia of chronic disease:: H&H relatively stable.  Anemia panel basically normal. Recent Labs    08/16/19 0214 02/12/20 2205 02/21/20 0526 02/22/20 0527 07/05/20 0545 07/06/20 9892 07/07/20 0514 07/08/20 0458 07/09/20 0339  HGB 11.2* 11.1* 11.5* 9.5* 10.6* 10.0*  8.8* 9.1* 8.9*  -Monitor H&H   Language barrier -Using sign interpreter.  Body mass index is 24.81 kg/m.         DVT prophylaxis:  enoxaparin (LOVENOX) injection 40 mg Start: 07/06/20 1500  Code Status: Full code Family Communication: Patient and/or RN. Available if any question.  Level of care: Med-Surg Status is: Inpatient  Remains inpatient appropriate because:IV treatments appropriate due to intensity of illness or inability to take PO and Inpatient level of care appropriate due to severity of illness   Dispo: The patient is from: Home              Anticipated d/c is to: Home with home health             Patient currently is not medically stable to d/c.   Difficult to place patient No       Consultants:  Podiatry Infectious disease   Sch Meds:  Scheduled Meds:  Chlorhexidine Gluconate Cloth  6 each Topical Daily   enoxaparin (LOVENOX) injection  40 mg Subcutaneous Q24H   ferrous sulfate  325 mg Oral Q breakfast   finasteride  5 mg Oral Daily   folic acid  119 mcg Oral Daily   gabapentin  100 mg Oral TID   levothyroxine  25 mcg Oral Q0600   multivitamin with minerals  1 tablet Oral Daily   omega-3 acid ethyl esters  1 g Oral Daily   pantoprazole  40 mg Oral Daily   polyethylene glycol  17 g Oral BID   senna  1 tablet Oral BID   sulfamethoxazole-trimethoprim  1 tablet Oral Q12H   tamsulosin  0.4 mg Oral Daily   vitamin B-12  1,000 mcg Oral Daily   Continuous Infusions:  sodium chloride 75 mL/hr at 07/07/20 2143   ceFEPime (MAXIPIME) IV 2 g (07/09/20 1206)   promethazine (PHENERGAN) injection (IM or IVPB) 12.5 mg (07/08/20 2324)   PRN Meds:.acetaminophen, bisacodyl, magnesium citrate, morphine injection, ondansetron (ZOFRAN) IV, oxyCODONE-acetaminophen, promethazine (PHENERGAN) injection (IM or IVPB)  Antimicrobials: Anti-infectives (From admission, onward)    Start     Dose/Rate Route Frequency Ordered Stop   07/09/20 0800   sulfamethoxazole-trimethoprim (BACTRIM DS) 800-160 MG per tablet 1 tablet        1 tablet Oral Every 12 hours 07/08/20 1604     07/08/20 2320  vancomycin (VANCOREADY) IVPB 750 mg/150 mL  Status:  Discontinued       See Hyperspace for full Linked Orders Report.   750 mg 150 mL/hr over 60 Minutes Intravenous Every 12 hours 07/08/20 1600 07/08/20 1604   07/08/20 0900  ceFEPIme (MAXIPIME) 2 g in sodium chloride 0.9 % 100 mL IVPB        2 g 200 mL/hr over 30 Minutes Intravenous Every 8 hours 07/08/20 0831     07/06/20 1700  ceFEPIme (MAXIPIME) 2 g in sodium chloride 0.9 % 100 mL IVPB  Status:  Discontinued        2 g 200 mL/hr over 30 Minutes Intravenous Every 12 hours 07/06/20 1526 07/08/20 0831   07/06/20 0800  vancomycin (VANCOREADY) IVPB 1250  mg/250 mL  Status:  Discontinued       See Hyperspace for full Linked Orders Report.   1,250 mg 166.7 mL/hr over 90 Minutes Intravenous Every 24 hours 07/05/20 0756 07/08/20 1600   07/06/20 0600  cefTRIAXone (ROCEPHIN) 2 g in sodium chloride 0.9 % 100 mL IVPB  Status:  Discontinued        2 g 200 mL/hr over 30 Minutes Intravenous Every 24 hours 07/05/20 0751 07/06/20 1518   07/05/20 0800  vancomycin (VANCOREADY) IVPB 500 mg/100 mL       See Hyperspace for full Linked Orders Report.   500 mg 100 mL/hr over 60 Minutes Intravenous  Once 07/05/20 0756 07/05/20 1219   07/05/20 0630  cefTRIAXone (ROCEPHIN) 2 g in sodium chloride 0.9 % 100 mL IVPB        2 g 200 mL/hr over 30 Minutes Intravenous  Once 07/05/20 0629 07/05/20 0709   07/05/20 0630  vancomycin (VANCOCIN) IVPB 1000 mg/200 mL premix        1,000 mg 200 mL/hr over 60 Minutes Intravenous  Once 07/05/20 0629 07/05/20 3267        I have personally reviewed the following labs and images: CBC: Recent Labs  Lab 07/05/20 0545 07/06/20 0623 07/07/20 0514 07/08/20 0458 07/09/20 0339  WBC 11.0* 7.6 5.5 5.8 5.4  NEUTROABS 9.5*  --   --   --   --   HGB 10.6* 10.0* 8.8* 9.1* 8.9*  HCT 31.4*  29.2* 26.7* 26.6* 26.7*  MCV 98.7 96.1 99.6 97.8 99.6  PLT 179 156 161 172 189   BMP &GFR Recent Labs  Lab 07/05/20 0545 07/06/20 1137 07/07/20 0514 07/07/20 0806 07/08/20 0458 07/09/20 0339  NA 133*  --  139  --  137 140  K 4.0  --  4.3  --  3.6 4.0  CL 102  --  106  --  106 109  CO2 23  --  28  --  27 27  GLUCOSE 105*  --  88  --  90 91  BUN 21  --  19  --  11 12  CREATININE 1.07 1.00 0.94  --  0.68 0.80  CALCIUM 8.7*  --  8.2*  --  7.9* 8.3*  MG  --   --   --  1.9  --  1.8  PHOS  --   --   --   --   --  2.6   Estimated Creatinine Clearance: 70 mL/min (by C-G formula based on SCr of 0.8 mg/dL). Liver & Pancreas: Recent Labs  Lab 07/05/20 0545 07/09/20 0339  AST 43*  --   ALT 29  --   ALKPHOS 100  --   BILITOT 1.6*  --   PROT 7.1  --   ALBUMIN 3.9 2.8*   Recent Labs  Lab 07/05/20 0545  LIPASE 46   No results for input(s): AMMONIA in the last 168 hours. Diabetic: No results for input(s): HGBA1C in the last 72 hours. No results for input(s): GLUCAP in the last 168 hours. Cardiac Enzymes: No results for input(s): CKTOTAL, CKMB, CKMBINDEX, TROPONINI in the last 168 hours. No results for input(s): PROBNP in the last 8760 hours. Coagulation Profile: Recent Labs  Lab 07/06/20 0623  INR 1.1   Thyroid Function Tests: No results for input(s): TSH, T4TOTAL, FREET4, T3FREE, THYROIDAB in the last 72 hours. Lipid Profile: No results for input(s): CHOL, HDL, LDLCALC, TRIG, CHOLHDL, LDLDIRECT in the last 72 hours. Anemia Panel: Recent  Labs    07/07/20 0806  VITAMINB12 1,533*  FOLATE 19.8  FERRITIN 710*  TIBC 176*  IRON 44*  RETICCTPCT 1.4   Urine analysis:    Component Value Date/Time   COLORURINE YELLOW (A) 07/05/2020 0545   APPEARANCEUR CLEAR (A) 07/05/2020 0545   APPEARANCEUR Clear 12/14/2013 2131   LABSPEC 1.014 07/05/2020 0545   LABSPEC 1.025 12/14/2013 2131   PHURINE 5.0 07/05/2020 [place] 07/05/2020 0545   GLUCOSEU Negative  12/14/2013 2131   HGBUR NEGATIVE 07/05/2020 0545   BILIRUBINUR NEGATIVE 07/05/2020 0545   BILIRUBINUR Negative 12/14/2013 2131   KETONESUR NEGATIVE 07/05/2020 0545   PROTEINUR NEGATIVE 07/05/2020 0545   NITRITE NEGATIVE 07/05/2020 0545   LEUKOCYTESUR NEGATIVE 07/05/2020 0545   LEUKOCYTESUR Negative 12/14/2013 2131   Sepsis Labs: Invalid input(s): PROCALCITONIN, [place]  Microbiology: Recent Results (from the past 240 hour(s))  Culture, blood (routine x 2)     Status: None (Preliminary result)   Collection Time: 07/05/20  5:45 AM   Specimen: BLOOD  Result Value Ref Range Status   Specimen Description BLOOD RHAND  Final   Special Requests   Final    BOTTLES DRAWN AEROBIC AND ANAEROBIC Blood Culture adequate volume   Culture   Final    NO GROWTH 4 DAYS Performed at [facility], [address] [place], [place] [address]    Report Status PENDING  Incomplete  Culture, blood (routine x 2)     Status: None (Preliminary result)   Collection Time: 07/05/20  5:45 AM   Specimen: BLOOD  Result Value Ref Range Status   Specimen Description BLOOD  LEFT HAND  Final   Special Requests   Final    BOTTLES DRAWN AEROBIC AND ANAEROBIC Blood Culture adequate volume   Culture   Final    NO GROWTH 4 DAYS Performed at [facility], [address], [place], [place] [address]    Report Status PENDING  Incomplete  Urine culture     Status: None   Collection Time: 07/05/20  5:45 AM   Specimen: Urine, Random  Result Value Ref Range Status   Specimen Description   Final    URINE, RANDOM Performed at [facility], [address] [place], [place] [address]    Special Requests   Final    NONE Performed at [facility], [address] [place], [place] [address]    Culture   Final    NO GROWTH Performed at [facility], [place] [address], [place],  [address]    Report Status 07/06/2020 FINAL  Final  Resp Panel by RT-PCR (Flu A&B, Covid)  Nasopharyngeal Swab     Status: None   Collection Time: 07/05/20  5:45 AM   Specimen: Nasopharyngeal Swab; Nasopharyngeal(NP) swabs in vial transport medium  Result Value Ref Range Status   SARS Coronavirus 2 by RT PCR NEGATIVE NEGATIVE Final    Comment: (NOTE) SARS-CoV-2 target nucleic acids are NOT DETECTED.  The SARS-CoV-2 RNA is generally detectable in upper respiratory specimens during the acute phase of infection. The lowest concentration of SARS-CoV-2 viral copies this assay can detect is 138 copies/mL. A negative result does not preclude SARS-Cov-2 infection and should not be used as the sole basis for treatment or other patient management decisions. A negative result may occur with  improper specimen collection/handling, submission of specimen other than nasopharyngeal swab, presence of viral mutation(s) within the areas targeted by this assay, and inadequate number of viral copies(<138  copies/mL). A negative result must be combined with clinical observations, patient history, and epidemiological information. The expected result is Negative.  Fact Sheet for Patients:  EntrepreneurPulse.com.au  Fact Sheet for Healthcare Providers:  IncredibleEmployment.be  This test is no t yet approved or cleared by the [place] FDA and  has been authorized for detection and/or diagnosis of SARS-CoV-2 by FDA under an Emergency Use Authorization (EUA). This EUA will remain  in effect (meaning this test can be used) for the duration of the COVID-19 declaration under Section 564(b)(1) of the Act, 21 U.S.C.section 360bbb-3(b)(1), unless the authorization is terminated  or revoked sooner.       Influenza A by PCR NEGATIVE NEGATIVE Final   Influenza B by PCR NEGATIVE NEGATIVE Final    Comment: (NOTE) The Xpert Xpress SARS-CoV-2/FLU/RSV plus assay is intended as an aid in the diagnosis of influenza from Nasopharyngeal swab specimens and should not be  used as a sole basis for treatment. Nasal washings and aspirates are unacceptable for Xpert Xpress SARS-CoV-2/FLU/RSV testing.  Fact Sheet for Patients: EntrepreneurPulse.com.au  Fact Sheet for Healthcare Providers: IncredibleEmployment.be  This test is not yet approved or cleared by the [place] FDA and has been authorized for detection and/or diagnosis of SARS-CoV-2 by FDA under an Emergency Use Authorization (EUA). This EUA will remain in effect (meaning this test can be used) for the duration of the COVID-19 declaration under Section 564(b)(1) of the Act, 21 U.S.C. section 360bbb-3(b)(1), unless the authorization is terminated or revoked.  Performed at [facility], [place], [place] [address]   Aerobic Culture w Gram Stain (superficial specimen)     Status: None   Collection Time: 07/05/20  5:45 AM   Specimen: Ankle  Result Value Ref Range Status   Specimen Description ANKLE  Final   Special Requests LEFT  Final   Gram Stain   Final    NO WBC SEEN MODERATE GRAM POSITIVE COCCI MODERATE GRAM VARIABLE ROD Performed at [facility], [place] [address], [place], [place] [address]    Culture FEW PSEUDOMONAS AERUGINOSA  Final   Report Status 07/07/2020 FINAL  Final   Organism ID, Bacteria PSEUDOMONAS AERUGINOSA  Final      Susceptibility   Pseudomonas aeruginosa - MIC*    CEFTAZIDIME <=1 SENSITIVE Sensitive     CIPROFLOXACIN <=0.25 SENSITIVE Sensitive     GENTAMICIN <=1 SENSITIVE Sensitive     IMIPENEM 1 SENSITIVE Sensitive     PIP/TAZO <=4 SENSITIVE Sensitive     CEFEPIME 2 SENSITIVE Sensitive     * FEW PSEUDOMONAS AERUGINOSA    Radiology Studies: No results found.    Sidni Fusco T. [place]  If 7PM-7AM, please contact night-coverage [email or web address] 07/09/2020, 2:38 PM

## 2020-07-09 NOTE — Care Management Important Message (Signed)
Important Message  Patient Details  Name: Ronald Huerta MRN: [id number] Date of Birth: [birth year]   Medicare Important Message Given:  Yes  Patient was sleeping so I left a copy of the Important Message from Medicare on his bedside table for him to review at his convenience.   Olegario Messier A Sakeenah Valcarcel 07/09/2020, 2:00 PM

## 2020-07-09 NOTE — Consult Note (Addendum)
WOC Nurse Consult Note: Patient receiving care in [phone] Reason for Consult: Left ankle wound Wound type: Full thickness and partial thickness of lateral and medial sides of the left ankle.  Pressure Injury POA: NA Measurement: Deferred Wound bed: medial wound is cratered, pink, yellow with yellow brown drainage on dressing. Lateral wound is dry erythematous, brown yellow drainage on dressing.  Drainage (amount, consistency, odor)  Dressing procedure/placement/frequency: Please see dressing orders from M. Alston on 07/06/20 Left ankle ulcer:   Cleanse wound with saline.  Pat dry.  Apply silver hydrofiber (Aquacedl Ag+) [place] [phone]) to wound and cover with padded gauze dressing, kerlix and Coban from toes to knees. Change M/Thursdays. If drainage strike though increase to M/W/F frequency change.  Monitor the wound area(s) for worsening of condition such as: Signs/symptoms of infection, increase in size, development of or worsening of odor, development of pain, or increased pain at the affected locations.   Notify the medical team if any of these develop.  Thank you for the consult. WOC nurse will not follow at this time.   Please re-consult the WOC team if needed.  Renaldo Reel Katrinka Blazing, MSN, RN, CMSRN, Angus Seller, [facility] Wound Treatment Associate Pager [phone]

## 2020-07-09 NOTE — Progress Notes (Signed)
ID Doing okay BP (!) 149/78 (BP Location: Right Arm)   Pulse (!) 108   Temp 97.6 F (36.4 C) (Oral)   Resp 18   Ht 5\' 9"  (1.753 m)   Wt 76.2 kg   SpO2 95%   BMI 24.81 kg/m    Awake and alert Chest CTA Hss1s2 Abd soft Left leg medial malleouls ulcer with surrounding erythema and thinning of skin. Leg now wrapped in coban    Labs CBC Latest Ref Rng & Units 07/09/2020 07/08/2020 07/07/2020  WBC 4.0 - 10.5 K/uL 5.4 5.8 5.5  Hemoglobin 13.0 - 17.0 g/dL 09/06/2020) 7.8(E) 4.2(P)  Hematocrit 39.0 - 52.0 % 26.7(L) 26.6(L) 26.7(L)  Platelets 150 - 400 K/uL 189 172 161    CMP Latest Ref Rng & Units 07/09/2020 07/08/2020 07/07/2020  Glucose 70 - 99 mg/dL 91 90 88  BUN 8 - 23 mg/dL 12 11 19   Creatinine 0.61 - 1.24 mg/dL 09/06/2020 1.44  Sodium 135 - 145 mmol/L 140 137 139  Potassium 3.5 - 5.1 mmol/L 4.0 3.6 4.3  Chloride 98 - 111 mmol/L 109 106 106  CO2 22 - 32 mmol/L 27 27 28   Calcium 8.9 - 10.3 mg/dL 8.3(L) 7.9(L) 8.2(L)  Total Protein 6.5 - 8.1 g/dL - - -  Total Bilirubin 0.3 - 1.2 mg/dL - - -  Alkaline Phos 38 - 126 U/L - - -  AST 15 - 41 U/L - - -  ALT 0 - 44 U/L - - -    Imaging MRI leg Posteromedial cutaneous ulceration along the ankle is relatively superficial, without abscess, gas in the soft tissues, or bony involvement.2. Tibialis posterior tenosynovitis and common peroneus tenosynovitis. Mild flexor hallucis longus tenosynovitis.   Impression/recommendation Venous stasis ulcer  on the medial malleolus of left leg- superficial Colonized with pseudomonas, had MRSA before Will need cipro until 07/17/20 Bactrim until 07/17/20 Continue compression wraps Discussed with care team

## 2020-07-10 ENCOUNTER — Inpatient Hospital Stay: Payer: Medicare Other

## 2020-07-10 DIAGNOSIS — I480 Paroxysmal atrial fibrillation: Secondary | ICD-10-CM

## 2020-07-10 DIAGNOSIS — A419 Sepsis, unspecified organism: Principal | ICD-10-CM

## 2020-07-10 DIAGNOSIS — D72829 Elevated white blood cell count, unspecified: Secondary | ICD-10-CM

## 2020-07-10 LAB — RENAL FUNCTION PANEL
Albumin: 2.9 g/dL — ABNORMAL LOW (ref 3.5–5.0)
Anion gap: 10 (ref 5–15)
BUN: 13 mg/dL (ref 8–23)
CO2: 25 mmol/L (ref 22–32)
Calcium: 8.1 mg/dL — ABNORMAL LOW (ref 8.9–10.3)
Chloride: 103 mmol/L (ref 98–111)
Creatinine, Ser: 0.91 mg/dL (ref 0.61–1.24)
GFR, Estimated: >60 mL/min (ref >60)
Glucose, Bld: 112 mg/dL — ABNORMAL HIGH (ref 70–99)
Phosphorus: 2.9 mg/dL (ref 2.5–4.6)
Potassium: 3.9 mmol/L (ref 3.5–5.1)
Sodium: 138 mmol/L (ref 135–145)

## 2020-07-10 LAB — CBC WITH DIFFERENTIAL/PLATELET
Abs Immature Granulocytes: 0.24 10*3/uL — ABNORMAL HIGH (ref 0.00–0.07)
Basophils Absolute: 0.1 10*3/uL (ref 0.0–0.1)
Basophils Relative: 0 %
Eosinophils Absolute: 0 10*3/uL (ref 0.0–0.5)
Eosinophils Relative: 0 %
HCT: 28.5 % — ABNORMAL LOW (ref 39.0–52.0)
Hemoglobin: 9.8 g/dL — ABNORMAL LOW (ref 13.0–17.0)
Immature Granulocytes: 1 %
Lymphocytes Relative: 2 %
Lymphs Abs: 0.6 10*3/uL — ABNORMAL LOW (ref 0.7–4.0)
MCH: 33.7 pg (ref 26.0–34.0)
MCHC: 34.4 g/dL (ref 30.0–36.0)
MCV: 97.9 fL (ref 80.0–100.0)
Monocytes Absolute: 0.8 10*3/uL (ref 0.1–1.0)
Monocytes Relative: 2 %
Neutro Abs: 38.7 10*3/uL — ABNORMAL HIGH (ref 1.7–7.7)
Neutrophils Relative %: 95 %
Platelets: 225 10*3/uL (ref 150–400)
RBC: 2.91 MIL/uL — ABNORMAL LOW (ref 4.22–5.81)
RDW: 15.9 % — ABNORMAL HIGH (ref 11.5–15.5)
Smear Review: NORMAL
WBC Morphology: INCREASED
WBC: 40.5 10*3/uL — ABNORMAL HIGH (ref 4.0–10.5)
nRBC: 0 % (ref 0.0–0.2)

## 2020-07-10 LAB — URINALYSIS, ROUTINE W REFLEX MICROSCOPIC
Bilirubin Urine: NEGATIVE
Glucose, UA: NEGATIVE mg/dL
Ketones, ur: 5 mg/dL — AB
Nitrite: NEGATIVE
Protein, ur: 30 mg/dL — AB
Specific Gravity, Urine: 1.027 (ref 1.005–1.030)
Squamous Epithelial / HPF: NONE SEEN (ref 0–5)
WBC, UA: >50 WBC/hpf — ABNORMAL HIGH (ref 0–5)
pH: 5 (ref 5.0–8.0)

## 2020-07-10 LAB — CBC
HCT: 28.6 % — ABNORMAL LOW (ref 39.0–52.0)
Hemoglobin: 9.9 g/dL — ABNORMAL LOW (ref 13.0–17.0)
MCH: 33.6 pg (ref 26.0–34.0)
MCHC: 34.6 g/dL (ref 30.0–36.0)
MCV: 96.9 fL (ref 80.0–100.0)
Platelets: 220 10*3/uL (ref 150–400)
RBC: 2.95 MIL/uL — ABNORMAL LOW (ref 4.22–5.81)
RDW: 15.9 % — ABNORMAL HIGH (ref 11.5–15.5)
WBC: 38.8 10*3/uL — ABNORMAL HIGH (ref 4.0–10.5)
nRBC: 0.1 % (ref 0.0–0.2)

## 2020-07-10 LAB — C-REACTIVE PROTEIN: CRP: 12.6 mg/dL — ABNORMAL HIGH (ref <1.0)

## 2020-07-10 LAB — LACTIC ACID, PLASMA
Lactic Acid, Venous: 1.7 mmol/L (ref 0.5–1.9)
Lactic Acid, Venous: 2.6 mmol/L (ref 0.5–1.9)
Lactic Acid, Venous: 2.1 mmol/L (ref 0.5–1.9)

## 2020-07-10 LAB — CK: Total CK: 202 U/L (ref 49–397)

## 2020-07-10 LAB — PROCALCITONIN: Procalcitonin: 4.44 ng/mL

## 2020-07-10 LAB — CREATININE, SERUM
Creatinine, Ser: 0.91 mg/dL (ref 0.61–1.24)
GFR, Estimated: >60 mL/min (ref >60)

## 2020-07-10 LAB — SEDIMENTATION RATE: Sed Rate: 60 mm/hr — ABNORMAL HIGH (ref 0–16)

## 2020-07-10 LAB — TSH: TSH: 0.947 u[IU]/mL (ref 0.350–4.500)

## 2020-07-10 LAB — MAGNESIUM: Magnesium: 2 mg/dL (ref 1.7–2.4)

## 2020-07-10 MED ORDER — KETOROLAC TROMETHAMINE 15 MG/ML IJ SOLN
15.0000 mg | Freq: Once | INTRAMUSCULAR | Status: AC
  Administered 2020-07-10: 15 mg via INTRAVENOUS
  Filled 2020-07-10: qty 1

## 2020-07-10 MED ORDER — BETHANECHOL CHLORIDE 25 MG PO TABS
25.0000 mg | ORAL_TABLET | Freq: Three times a day (TID) | ORAL | Status: DC
  Administered 2020-07-10 – 2020-07-13 (×10): 25 mg via ORAL
  Filled 2020-07-10 (×10): qty 1

## 2020-07-10 MED ORDER — SODIUM CHLORIDE 0.9 % IV SOLN
6.0000 mg/kg | Freq: Every day | INTRAVENOUS | Status: AC
  Administered 2020-07-10 – 2020-07-13 (×4): 450 mg via INTRAVENOUS
  Filled 2020-07-10 (×5): qty 9

## 2020-07-10 MED ORDER — POLYVINYL ALCOHOL 1.4 % OP SOLN
1.0000 [drp] | OPHTHALMIC | Status: DC | PRN
  Filled 2020-07-10: qty 15

## 2020-07-10 MED ORDER — IOHEXOL 300 MG/ML  SOLN
100.0000 mL | Freq: Once | INTRAMUSCULAR | Status: AC | PRN
  Administered 2020-07-10: 16:00:00 100 mL via INTRAVENOUS

## 2020-07-10 MED ORDER — SODIUM CHLORIDE 0.9 % IV SOLN: INTRAVENOUS | Status: DC

## 2020-07-10 MED ORDER — PIPERACILLIN-TAZOBACTAM 3.375 G IVPB
3.3750 g | Freq: Three times a day (TID) | INTRAVENOUS | Status: AC
  Administered 2020-07-10 – 2020-07-13 (×11): 3.375 g via INTRAVENOUS
  Filled 2020-07-10 (×10): qty 50

## 2020-07-10 MED ORDER — IOHEXOL 9 MG/ML PO SOLN
500.0000 mL | ORAL | Status: AC
  Administered 2020-07-10: 500 mL via ORAL

## 2020-07-10 NOTE — Progress Notes (Signed)
ID Patient with left lower extremity cellulitis and left medial venous ulcer was doing well until last night when he had a high mews score.  This morning he is feeling a little delirious and also having generalized body ache His WBC count is increased to 40.5. Is got low-grade temperature of 99.6. Does not have any diarrhea On reviewing chart patient had foley placed by ED on admission and it was removed on 07/08/20. As he was having some retention he had straight cath once before a new foley was placed last night.   Patient Vitals for the past 24 hrs:  BP Temp Temp src Pulse Resp SpO2  07/10/20 1121 (!) 114/51 99.4 F (37.4 C) Oral (!) 104 20 95 %  07/10/20 0752 (!) 109/52 98.8 F (37.1 C) -- 97 20 94 %  07/10/20 0350 130/63 99.4 F (37.4 C) Oral (!) 108 20 100 %  07/10/20 0017 114/82 98 F (36.7 C) Oral (!) 104 20 94 %  07/09/20 2229 (!) 111/42 99.2 F (37.3 C) Oral 92 20 91 %  07/09/20 2027 130/76 99.6 F (37.6 C) Oral (!) 108 (!) 24 97 %  07/09/20 1522 (!) 130/39 98.7 F (37.1 C) Oral 86 20 96 %        O/e awake , alert but some confusion Distress on moving his limbs Says he is hurting everywhere Chestb/l air entry Hs s1s2 Abd soft foley Left leg  venous ulcer and skin around Same   Left hand dorsum mild erythema at the site of previous IV   Labs CBC Latest Ref Rng & Units 07/10/2020 07/10/2020 07/09/2020  WBC 4.0 - 10.5 K/uL 40.5(H) 38.8(H) 5.4  Hemoglobin 13.0 - 17.0 g/dL 4.6(E) 7.0(J) 8.9(L)  Hematocrit 39.0 - 52.0 % 28.5(L) 28.6(L) 26.7(L)  Platelets 150 - 400 K/uL 225 220 189     CMP Latest Ref Rng & Units 07/10/2020 07/10/2020 07/09/2020  Glucose 70 - 99 mg/dL - 500(X) 91  BUN 8 - 23 mg/dL - 13 12  Creatinine 3.81 - 1.24 mg/dL 8.29 9.37 1.69  Sodium 135 - 145 mmol/L - 138 140  Potassium 3.5 - 5.1 mmol/L - 3.9 4.0  Chloride 98 - 111 mmol/L - 103 109  CO2 22 - 32 mmol/L - 25 27  Calcium 8.9 - 10.3 mg/dL - 8.1(L) 8.3(L)  Total Protein 6.5 - 8.1 g/dL - - -   Total Bilirubin 0.3 - 1.2 mg/dL - - -  Alkaline Phos 38 - 126 U/L - - -  AST 15 - 41 U/L - - -  ALT 0 - 44 U/L - - -    Microbiology 07/05/2020 blood culture negative  MRI Posteromedial cutaneous ulceration along the ankle is relatively superficial, without abscess, gas in the soft tissues, or bony involvement. 2. Tibialis posterior tenosynovitis and common peroneus tenosynovitis. Mild flexor hallucis longus tenosynovitis. 3. Ill definition of the medioplantar oblique portion of the spring ligament, likely chronic injury given similar findings in 2017. 4. Subcutaneous edema along the ankle and tracking dorsally in the foot, primarily in the lateral and anteromedial ankle.   Impression/recommendation  New Leucocytosis with body aches-  Unclear etiology- he was being treated for cellulitis and venous ulcer and had normal WBC yesterday R/o UTI as he had a foley and then self cath No diarrhea  Blood culture, urine culture sent Recommend imaging leg and kidney Will start him on zosyn and dapto . DC cipro and bactrim  Venous ulcer with cellulitis left leg- pseudomonas in culture.  Previously had MSSa, enterococcus.  Delirium- avoid opioids   Discussed the management with care team- ID will follow him peripherally tomorrow- call if needed

## 2020-07-10 NOTE — Progress Notes (Signed)
OT Cancellation Note  Patient Details Name: Ronald Huerta MRN: [id number] DOB: 08-Dec-1934   Cancelled Treatment:    Reason Eval/Treat Not Completed: Medical issues which prohibited therapy. Pt with stable vitals but WBC has increased from 5.4 to 40 in 24 hours. Pt not feeling well and vomiting during PT session and RN reports pt is just unwell. OT to follow up at next available time for pt to be able to actively engage in OT intervention.   Jackquline Denmark, MS, OTR/L , CBIS ascom [phone]  07/10/20, 1:39 PM      07/10/2020, 1:37 PM

## 2020-07-10 NOTE — Progress Notes (Signed)
Pharmacy Antibiotic Note  Ronald Huerta is a 86 y.o. male admitted on 07/05/2020 with  ankle wound .  Pharmacy has been consulted for daptomycin and piperacillin/tazobactam dosing. Patient with ankle wound for several months.  Cultures: Jan 2022 with MSSA, E. Faecalis, Bacteroides.   Feb 2022 MRSA June 2022 Pseudomonas  Today, 07/10/2020 Day #6 Vanco to PO TMP/SMZ Day #4 Cefepime to Ciprofloxacin WBC increased to 40.5 Afebrile Not stools documented SCr WNL, stable  Plan: Daptomycin 450mg  IV q24h CK pending Check CK weekly while on daptomycin Piperacillin/tazobactam 3.375gm IV q8h over 4h infusion Await pending labs/cultures.   Height: 5\' 9"  (175.3 cm) Weight: 76.2 kg (168 lb) IBW/kg (Calculated) : 70.7  Temp (24hrs), Avg:98.8 F (37.1 C), Min:97.6 F (36.4 C), Max:99.6 F (37.6 C)  Recent Labs  Lab 07/05/20 0545 07/06/20 0623 07/06/20 1137 07/07/20 0514 07/08/20 0458 07/09/20 0339 07/10/20 0509 07/10/20 0743  WBC 11.0*   < >  --  5.5 5.8 5.4 38.8* 40.5*  CREATININE 1.07  --  1.00 0.94 0.68 0.80 0.91  0.91  --   LATICACIDVEN 1.2  --   --   --   --   --   --   --    < > = values in this interval not displayed.    Estimated Creatinine Clearance: 61.5 mL/min (by C-G formula based on SCr of 0.91 mg/dL).    No Known Allergies  Antimicrobials this admission: vancomycin 6/5 >> 6/8 ceftriaxone 6/5 >> 6/6 Cefepime 6/8 >> 6/9 TMP/SMZ 6/9 >> 6/10 Daptomycin 6/10 >> Pip/tazo 6/10 >>  Dose adjustments this admission:   Microbiology results: 6/5 BCx: NGTD 6/5 UCx: NG  6/5 L ankle: P aeruginosa (pan susc) 6/10 Ucx: 6/10 Bcx:   Thank you for allowing pharmacy to be a part of this patient's care.  8/10, PharmD, BCPS.   Work Cell: [phone] 07/10/2020 10:33 AM

## 2020-07-10 NOTE — Progress Notes (Signed)
Physical Therapy Treatment Patient Details Name: Ronald Huerta MRN: [id number] DOB: Jan 10, 1935 Today's Date: 07/10/2020    History of Present Illness Per MD notes: Pt is an 86 y.o. male with medical history significant of HLD, GERD, hypohyroidism, anxiety, deaf and mute, RLS, IBS, kidney stone, BPH, anemia, peripheral neuropathy, who presents with wound infection left leg.  MD assessment includes: Left Ankle wound infection, cellulitis, BPH with obstruction/lower urinary tract symptoms, and abdominal pain.    PT Comments    The pt is presenting with significant generalized pain that greatly limited today's session. He was agreeable to attempting mobility and repositioning for comfort, but was able to do so without n/v. This session the pt requires increased assistance for all mobility (forward lean, lower extremity elevation) d/t c/o pain. Following dependent repositioning the pt reports improved comfort and is also thankful for the education provided. PT will continue to assess in order to determine in current d/c plan remains appropriate. Pt continues to presents with medical complexity which greatly limited mobility this session.     Follow Up Recommendations  Home health PT;Supervision for mobility/OOB     Equipment Recommendations  None recommended by PT    Recommendations for Other Services       Precautions / Restrictions Precautions Precautions: Fall    Mobility  Bed Mobility               General bed mobility comments: Pt unable to tolerate bed mobility this session. He requires total assistance for all repositioning d/t cognitive status and reported pain.    Transfers                    Ambulation/Gait                 Stairs             Wheelchair Mobility    Modified Rankin (Stroke Patients Only)       Balance                                            Cognition Arousal/Alertness: Suspect due to  medications (Pt closing eyes to sign, unable to keep eyes open throughout session. Requiring tactile stimuli for eye opening.) Behavior During Therapy: Restless;Anxious Overall Cognitive Status: Impaired/Different from baseline Area of Impairment: Attention                                      Exercises      General Comments        Pertinent Vitals/Pain Pain Assessment: 0-10 Pain Score: 10-Worst pain ever Pain Location: Generalized full body pain. Pt reporting some nerve pain. Pain Intervention(s): Limited activity within patient's tolerance (Pt reporting want for heat pack; however with increased WBC count and concern for infection deferred.)    Home Living Family/patient expects to be discharged to:: Private residence                    Prior Function            PT Goals (current goals can now be found in the care plan section) Acute Rehab PT Goals Patient Stated Goal: to go home PT Goal Formulation: With patient Time For Goal Achievement: 07/19/20 Potential to Achieve Goals: Good Progress towards  PT goals: PT to reassess next treatment    Frequency    Min 2X/week      PT Plan Other (comment) (Will continue to assess current medical needs in order to determine if PT plan is appropriate.)    Co-evaluation              AM-PAC PT "6 Clicks" Mobility   Outcome Measure  Help needed turning from your back to your side while in a flat bed without using bedrails?: A Lot Help needed moving from lying on your back to sitting on the side of a flat bed without using bedrails?: A Lot Help needed moving to and from a bed to a chair (including a wheelchair)?: Total Help needed standing up from a chair using your arms (e.g., wheelchair or bedside chair)?: Total Help needed to walk in hospital room?: Total Help needed climbing 3-5 steps with a railing? : Total 6 Click Score: 8    End of Session   Activity Tolerance: Treatment limited secondary  to medical complications (Comment) Patient left: in bed;with bed alarm set   PT Visit Diagnosis: Unsteadiness on feet (R26.81);Muscle weakness (generalized) (M62.81);Difficulty in walking, not elsewhere classified (R26.2)     Time: 5284-1324 PT Time Calculation (min) (ACUTE ONLY): 21 min  Charges:  $Therapeutic Activity: 8-22 mins                     12:30 PM, 07/10/20 Ronald Huerta A. Mordecai Maes PT, DPT Physical Therapist - [facility]    Avner Stroder A Evvie Behrmann 07/10/2020, 12:26 PM

## 2020-07-10 NOTE — Progress Notes (Signed)
PROGRESS NOTE  Ronald Huerta HKV:[id number] DOB: [birth year]   PCP: Barbette Reichmann, MD  Patient is from: Home  DOA: 07/05/2020 LOS: 5  Chief complaints: Left foot infection  Brief Narrative / Interim history: 86 year old M with PMH of peripheral neuropathy, CAD, hypothyroidism, impaired hearing, RLS, IBS, anxiety and BPH presenting with worsening left foot/ankle infection that has not improved with p.o. Bactrim outpatient.  He has been admitted for left ankle wound infection/cellulitis.  Has been started on vancomycin and cefepime.  MRI showed ulceration with tenosynovitis but no abscess or gas or osseous involvement.  Podiatry consulted and recommended antibiotics and signed off.  Superficial wound culture grew Pseudomonas with moderate GPC's.  Blood cultures NGTD.  Infectious disease consulted  and de-escalated antibiotics to Cipro and Bactrim.  Patient's leukocytosis jumped from 5.4-40.  Procalcitonin 4.44.  Inflammatory markers remains elevated.  UA, urine culture, blood cultures obtained.  Antibiotics escalated to IV Zosyn and daptomycin per ID recommendation.  Subjective: Seen and examined earlier this morning with the help of sign language interpreter with ID number [id number].  Patient was somewhat confused overnight.  Mild temp to 99.6.  Leukocytosis to 40 from 5.4 with left shift.  He complains of generalized body pain with movement.  Denies shortness of breath or GI symptoms.  He is awake and alert but only oriented to self and place but not time.  Objective: Vitals:   07/09/20 2229 07/10/20 0017 07/10/20 0350 07/10/20 0752  BP: (!) 111/42 114/82 130/63 (!) 109/52  Pulse: 92 (!) 104 (!) 108 97  Resp: Temp: 99.2 F (37.3 C) 98 F (36.7 C) 99.4 F (37.4 C) 98.8 F (37.1 C)  TempSrc: Oral Oral Oral   SpO2: 91% 94% 100% 94%  Weight:      Height:        Intake/Output Summary (Last 24 hours) at 07/10/2020 1045 Last data filed at 07/10/2020 0856 Gross per 24  hour  Intake 4184.19 ml  Output 1100 ml  Net 3084.19 ml   Filed Weights   07/05/20 0540  Weight: 76.2 kg    Examination:  GENERAL: No apparent distress.  Nontoxic. HEENT: MMM.  Vision and hearing grossly intact.  NECK: Supple.  No apparent JVD.  RESP: On RA.  No IWOB.  Fair aeration bilaterally. CVS:  RRR. Heart sounds normal.  ABD/GI/GU: BS+. Abd soft, NTND.  Indwelling Foley. MSK/EXT:  Moves extremities.  Diffuse nonfocal tenderness SKIN: Ulceration over medial aspect of left ankle with surrounding erythema.  See picture below. NEURO: Awake and alert. Oriented to self and place.  No apparent focal neuro deficit. PSYCH: Somewhat anxious.  No distress or agitation.      Procedures:  None  Microbiology summarized: 6/5-COVID-19 and influenza PCR nonreactive. 6/5-urine culture NGTD. 6/5-superficial wound culture with Pseudomonas aeruginosa and GPC 6/5-blood cultures NGTD. 6/10-urine culture pending 6/10-blood culture pending  Assessment & Plan: Sepsis in the setting of left Ankle wound infection, cellulitis: patient's leukocytosis up from 5.4-40.  Inflammatory markers remains elevated.  Pro-Cal 4.4.  Patient has myalgia and indwelling Foley catheter.  Concern for bacteremia.  No significant change to his left ankle wound.  -6/5-MRI concerning for cellulitis and tenosynovitis but no abscess or gas. -Evaluated by podiatry, Dr. Ether Griffins on admission and surgery was not indicated. -Check lactic acid, urinalysis, urine culture and blood culture -Broad-spectrum antibiotics with daptomycin and Zosyn -ID to see patient.   BPH with LUTS-failed multiple voiding trials -Continue Flomax and Proscar. -Start bethanechol and  reattempt voiding trial  New onset A. Fib: patient had brief A. fib with RVR to 140s but quickly converted to sinus rhythm without intervention.  He was not symptomatic.  Has no history of A. fib in his chart.  TTE in 2019 basically normal.  CHA2DS2-VASc score 2  (for age). -Consider rate control if further A. fib with RVR -Defer anticoagulation given low CHA2DS2-VASc score and low A. fib burden -Optimize electrolytes -Continue telemetry  Hypothyroidism:  -Continue with Synthroid   Deafness; -Using sign language interpreter   GERD: Continue with PPI   Abdominal pain/constipation: CT scan is negative but suggests constipation. -MiraLAX, Senokot-S and mag citrate as needed based on severity   Mild hyponatremia: Resolved.   Anemia of chronic disease:: H&H relatively stable.  Anemia panel basically normal. Recent Labs    02/12/20 2205 02/21/20 0526 02/22/20 0527 07/05/20 0545 07/06/20 5093 07/07/20 0514 07/08/20 0458 07/09/20 0339 07/10/20 0509 07/10/20 0743  HGB 11.1* 11.5* 9.5* 10.6* 10.0* 8.8* 9.1* 8.9* 9.9* 9.8*  -Monitor H&H   Language barrier -Using sign interpreter.  Body mass index is 24.81 kg/m.         DVT prophylaxis:  enoxaparin (LOVENOX) injection 40 mg Start: 07/06/20 1500  Code Status: Full code Family Communication: Attempted to call patient's son for update but no answer. Level of care: Med-Surg Status is: Inpatient  Remains inpatient appropriate because:IV treatments appropriate due to intensity of illness or inability to take PO and Inpatient level of care appropriate due to severity of illness   Dispo: The patient is from: Home              Anticipated d/c is to: Home with home health             Patient currently is not medically stable to d/c.   Difficult to place patient No       Consultants:  Podiatry Infectious disease   Sch Meds:  Scheduled Meds:  Chlorhexidine Gluconate Cloth  6 each Topical Daily   enoxaparin (LOVENOX) injection  40 mg Subcutaneous Q24H   ferrous sulfate  325 mg Oral Q breakfast   finasteride  5 mg Oral Daily   folic acid  500 mcg Oral Daily   gabapentin  100 mg Oral TID   levothyroxine  25 mcg Oral Q0600   multivitamin with minerals  1 tablet Oral Daily    omega-3 acid ethyl esters  1 g Oral Daily   pantoprazole  40 mg Oral Daily   polyethylene glycol  17 g Oral BID   senna  1 tablet Oral BID   tamsulosin  0.4 mg Oral Daily   vitamin B-12  1,000 mcg Oral Daily   Continuous Infusions:  sodium chloride 75 mL/hr at 07/10/20 0856   DAPTOmycin (CUBICIN)  IV     piperacillin-tazobactam (ZOSYN)  IV     promethazine (PHENERGAN) injection (IM or IVPB) 12.5 mg (07/08/20 2324)   PRN Meds:.acetaminophen, bisacodyl, magnesium citrate, morphine injection, ondansetron (ZOFRAN) IV, oxyCODONE-acetaminophen, polyvinyl alcohol, promethazine (PHENERGAN) injection (IM or IVPB)  Antimicrobials: Anti-infectives (From admission, onward)    Start     Dose/Rate Route Frequency Ordered Stop   07/10/20 1200  DAPTOmycin (CUBICIN) 450 mg in sodium chloride 0.9 % IVPB        6 mg/kg  76.2 kg 118 mL/hr over 30 Minutes Intravenous Daily 07/10/20 1035     07/10/20 1130  piperacillin-tazobactam (ZOSYN) IVPB 3.375 g        3.375 g 12.5 mL/hr  over 240 Minutes Intravenous Every 8 hours 07/10/20 1035     07/10/20 0800  ciprofloxacin (CIPRO) tablet 500 mg  Status:  Discontinued        500 mg Oral 2 times daily 07/09/20 1646 07/10/20 0937   07/09/20 0800  sulfamethoxazole-trimethoprim (BACTRIM DS) 800-160 MG per tablet 1 tablet  Status:  Discontinued        1 tablet Oral Every 12 hours 07/08/20 1604 07/10/20 0937   07/08/20 2320  vancomycin (VANCOREADY) IVPB 750 mg/150 mL  Status:  Discontinued       See Hyperspace for full Linked Orders Report.   750 mg 150 mL/hr over 60 Minutes Intravenous Every 12 hours 07/08/20 1600 07/08/20 1604   07/08/20 0900  ceFEPIme (MAXIPIME) 2 g in sodium chloride 0.9 % 100 mL IVPB        2 g 200 mL/hr over 30 Minutes Intravenous Every 8 hours 07/08/20 0831 07/10/20 0722   07/06/20 1700  ceFEPIme (MAXIPIME) 2 g in sodium chloride 0.9 % 100 mL IVPB  Status:  Discontinued        2 g 200 mL/hr over 30 Minutes Intravenous Every 12 hours 07/06/20  1526 07/08/20 0831   07/06/20 0800  vancomycin (VANCOREADY) IVPB 1250 mg/250 mL  Status:  Discontinued       See Hyperspace for full Linked Orders Report.   1,250 mg 166.7 mL/hr over 90 Minutes Intravenous Every 24 hours 07/05/20 0756 07/08/20 1600   07/06/20 0600  cefTRIAXone (ROCEPHIN) 2 g in sodium chloride 0.9 % 100 mL IVPB  Status:  Discontinued        2 g 200 mL/hr over 30 Minutes Intravenous Every 24 hours 07/05/20 0751 07/06/20 1518   07/05/20 0800  vancomycin (VANCOREADY) IVPB 500 mg/100 mL       See Hyperspace for full Linked Orders Report.   500 mg 100 mL/hr over 60 Minutes Intravenous  Once 07/05/20 0756 07/05/20 1219   07/05/20 0630  cefTRIAXone (ROCEPHIN) 2 g in sodium chloride 0.9 % 100 mL IVPB        2 g 200 mL/hr over 30 Minutes Intravenous  Once 07/05/20 0629 07/05/20 0709   07/05/20 0630  vancomycin (VANCOCIN) IVPB 1000 mg/200 mL premix        1,000 mg 200 mL/hr over 60 Minutes Intravenous  Once 07/05/20 0629 07/05/20 0812        I have personally reviewed the following labs and images: CBC: Recent Labs  Lab 07/05/20 0545 07/06/20 0623 07/07/20 0514 07/08/20 0458 07/09/20 0339 07/10/20 0509 07/10/20 0743  WBC 11.0*   < > 5.5 5.8 5.4 38.8* 40.5*  NEUTROABS 9.5*  --   --   --   --   --  38.7*  HGB 10.6*   < > 8.8* 9.1* 8.9* 9.9* 9.8*  HCT 31.4*   < > 26.7* 26.6* 26.7* 28.6* 28.5*  MCV 98.7   < > 99.6 97.8 99.6 96.9 97.9  PLT 179   < > 161 172 189 220 225   < > = values in this interval not displayed.   BMP &GFR Recent Labs  Lab 07/05/20 0545 07/06/20 1137 07/07/20 0514 07/07/20 0806 07/08/20 0458 07/09/20 0339 07/10/20 0509  NA 133*  --  139  --  137 140 138  K 4.0  --  4.3  --  3.6 4.0 3.9  CL 102  --  106  --  106 109 103  CO2 23  --  28  --  27 27 25   GLUCOSE 105*  --  88  --  90 91 112*  BUN 21  --  19  --  11 12 13   CREATININE 1.07 1.00 0.94  --  0.68 0.80 0.91  0.91  CALCIUM 8.7*  --  8.2*  --  7.9* 8.3* 8.1*  MG  --   --   --  1.9   --  1.8 2.0  PHOS  --   --   --   --   --  2.6 2.9   Estimated Creatinine Clearance: 61.5 mL/min (by C-G formula based on SCr of 0.91 mg/dL). Liver & Pancreas: Recent Labs  Lab 07/05/20 0545 07/09/20 0339 07/10/20 0509  AST 43*  --   --   ALT 29  --   --   ALKPHOS 100  --   --   BILITOT 1.6*  --   --   PROT 7.1  --   --   ALBUMIN 3.9 2.8* 2.9*   Recent Labs  Lab 07/05/20 0545  LIPASE 46   No results for input(s): AMMONIA in the last 168 hours. Diabetic: No results for input(s): HGBA1C in the last 72 hours. Recent Labs  Lab 07/09/20 2053  GLUCAP 97   Cardiac Enzymes: No results for input(s): CKTOTAL, CKMB, CKMBINDEX, TROPONINI in the last 168 hours. No results for input(s): PROBNP in the last 8760 hours. Coagulation Profile: Recent Labs  Lab 07/06/20 0623  INR 1.1   Thyroid Function Tests: Recent Labs    07/10/20 0509  TSH 0.947   Lipid Profile: No results for input(s): CHOL, HDL, LDLCALC, TRIG, CHOLHDL, LDLDIRECT in the last 72 hours. Anemia Panel: No results for input(s): VITAMINB12, FOLATE, FERRITIN, TIBC, IRON, RETICCTPCT in the last 72 hours.  Urine analysis:    Component Value Date/Time   COLORURINE YELLOW (A) 07/05/2020 0545   APPEARANCEUR CLEAR (A) 07/05/2020 0545   APPEARANCEUR Clear 12/14/2013 2131   LABSPEC 1.014 07/05/2020 0545   LABSPEC 1.025 12/14/2013 2131   PHURINE 5.0 07/05/2020 0545   GLUCOSEU NEGATIVE 07/05/2020 0545   GLUCOSEU Negative 12/14/2013 2131   HGBUR NEGATIVE 07/05/2020 0545   BILIRUBINUR NEGATIVE 07/05/2020 0545   BILIRUBINUR Negative 12/14/2013 2131   KETONESUR NEGATIVE 07/05/2020 0545   PROTEINUR NEGATIVE 07/05/2020 0545   NITRITE NEGATIVE 07/05/2020 0545   LEUKOCYTESUR NEGATIVE 07/05/2020 0545   LEUKOCYTESUR Negative 12/14/2013 2131   Sepsis Labs: Invalid input(s): PROCALCITONIN, LACTICIDVEN  Microbiology: Recent Results (from the past 240 hour(s))  Culture, blood (routine x 2)     Status: None (Preliminary  result)   Collection Time: 07/05/20  5:45 AM   Specimen: BLOOD  Result Value Ref Range Status   Specimen Description BLOOD RHAND  Final   Special Requests   Final    BOTTLES DRAWN AEROBIC AND ANAEROBIC Blood Culture adequate volume   Culture   Final    NO GROWTH 4 DAYS Performed at [facility], [address], [place], [address] [place]    Report Status PENDING  Incomplete  Culture, blood (routine x 2)     Status: None (Preliminary result)   Collection Time: 07/05/20  5:45 AM   Specimen: BLOOD  Result Value Ref Range Status   Specimen Description BLOOD  LEFT HAND  Final   Special Requests   Final    BOTTLES DRAWN AEROBIC AND ANAEROBIC Blood Culture adequate volume   Culture   Final    NO GROWTH 4 DAYS Performed at [facility], [address], [place], [place] [address]    Report Status PENDING  Incomplete  Urine culture     Status: None   Collection Time: 07/05/20  5:45 AM   Specimen: Urine, Random  Result Value Ref Range Status   Specimen Description   Final    URINE, RANDOM Performed at [facility], [address], [place], [place] [address]    Special Requests   Final    NONE Performed at [facility], [address] [place], [place] [address]    Culture   Final    NO GROWTH Performed at [facility], [address] [place], [place] [address]    Report Status 07/06/2020 FINAL  Final  Resp Panel by RT-PCR (Flu A&B, Covid) Nasopharyngeal Swab     Status: None   Collection Time: 07/05/20  5:45 AM   Specimen: Nasopharyngeal Swab; Nasopharyngeal(NP) swabs in vial transport medium  Result Value Ref Range Status   SARS Coronavirus 2 by RT PCR NEGATIVE NEGATIVE Final    Comment: (NOTE) SARS-CoV-2 target nucleic acids are NOT DETECTED.  The SARS-CoV-2 RNA is generally detectable in upper respiratory specimens during the acute phase of infection. The lowest concentration of SARS-CoV-2 viral copies this assay can detect  is 138 copies/mL. A negative result does not preclude SARS-Cov-2 infection and should not be used as the sole basis for treatment or other patient management decisions. A negative result may occur with  improper specimen collection/handling, submission of specimen other than nasopharyngeal swab, presence of viral mutation(s) within the areas targeted by this assay, and inadequate number of viral copies(<138 copies/mL). A negative result must be combined with clinical observations, patient history, and epidemiological information. The expected result is Negative.  Fact Sheet for Patients:  BloggerCourse.com  Fact Sheet for Healthcare Providers:  SeriousBroker.it  This test is no t yet approved or cleared by the [place] FDA and  has been authorized for detection and/or diagnosis of SARS-CoV-2 by FDA under an Emergency Use Authorization (EUA). This EUA will remain  in effect (meaning this test can be used) for the duration of the COVID-19 declaration under Section 564(b)(1) of the Act, 21 U.S.C.section 360bbb-3(b)(1), unless the authorization is terminated  or revoked sooner.       Influenza A by PCR NEGATIVE NEGATIVE Final   Influenza B by PCR NEGATIVE NEGATIVE Final    Comment: (NOTE) The Xpert Xpress SARS-CoV-2/FLU/RSV plus assay is intended as an aid in the diagnosis of influenza from Nasopharyngeal swab specimens and should not be used as a sole basis for treatment. Nasal washings and aspirates are unacceptable for Xpert Xpress SARS-CoV-2/FLU/RSV testing.  Fact Sheet for Patients: BloggerCourse.com  Fact Sheet for Healthcare Providers: SeriousBroker.it  This test is not yet approved or cleared by the [place] FDA and has been authorized for detection and/or diagnosis of SARS-CoV-2 by FDA under an Emergency Use Authorization (EUA). This EUA will remain in effect  (meaning this test can be used) for the duration of the COVID-19 declaration under Section 564(b)(1) of the Act, 21 U.S.C. section 360bbb-3(b)(1), unless the authorization is terminated or revoked.  Performed at [facility], [address] [place], [place] [address]   Aerobic Culture w Gram Stain (superficial specimen)     Status: None   Collection Time: 07/05/20  5:45 AM   Specimen: Ankle  Result Value Ref Range Status   Specimen Description ANKLE  Final   Special Requests LEFT  Final   Gram Stain  Final    NO WBC SEEN MODERATE GRAM POSITIVE COCCI MODERATE GRAM VARIABLE ROD Performed at [facility], [address] [place], [place] [id number]    Culture FEW PSEUDOMONAS AERUGINOSA  Final   Report Status 07/07/2020 FINAL  Final   Organism ID, Bacteria PSEUDOMONAS AERUGINOSA  Final      Susceptibility   Pseudomonas aeruginosa - MIC*    CEFTAZIDIME <=1 SENSITIVE Sensitive     CIPROFLOXACIN <=0.25 SENSITIVE Sensitive     GENTAMICIN <=1 SENSITIVE Sensitive     IMIPENEM 1 SENSITIVE Sensitive     PIP/TAZO <=4 SENSITIVE Sensitive     CEFEPIME 2 SENSITIVE Sensitive     * FEW PSEUDOMONAS AERUGINOSA    Radiology Studies: No results found.    Ronald Huerta T. Quanell Loughney [facility]  If 7PM-7AM, please contact night-coverage [email or web address] 07/10/2020, 10:45 AM

## 2020-07-11 DIAGNOSIS — G9341 Metabolic encephalopathy: Secondary | ICD-10-CM

## 2020-07-11 LAB — CBC
HCT: 24.8 % — ABNORMAL LOW (ref 39.0–52.0)
Hemoglobin: 8.6 g/dL — ABNORMAL LOW (ref 13.0–17.0)
MCH: 33.3 pg (ref 26.0–34.0)
MCHC: 34.7 g/dL (ref 30.0–36.0)
MCV: 96.1 fL (ref 80.0–100.0)
Platelets: 195 10*3/uL (ref 150–400)
RBC: 2.58 MIL/uL — ABNORMAL LOW (ref 4.22–5.81)
RDW: 15.9 % — ABNORMAL HIGH (ref 11.5–15.5)
WBC: 26 10*3/uL — ABNORMAL HIGH (ref 4.0–10.5)
nRBC: 0 % (ref 0.0–0.2)

## 2020-07-11 LAB — COMPREHENSIVE METABOLIC PANEL
ALT: 71 U/L — ABNORMAL HIGH (ref 0–44)
AST: 69 U/L — ABNORMAL HIGH (ref 15–41)
Albumin: 2.5 g/dL — ABNORMAL LOW (ref 3.5–5.0)
Alkaline Phosphatase: 121 U/L (ref 38–126)
Anion gap: 4 — ABNORMAL LOW (ref 5–15)
BUN: 22 mg/dL (ref 8–23)
CO2: 26 mmol/L (ref 22–32)
Calcium: 7.9 mg/dL — ABNORMAL LOW (ref 8.9–10.3)
Chloride: 105 mmol/L (ref 98–111)
Creatinine, Ser: 0.89 mg/dL (ref 0.61–1.24)
GFR, Estimated: >60 mL/min (ref >60)
Glucose, Bld: 101 mg/dL — ABNORMAL HIGH (ref 70–99)
Potassium: 3.7 mmol/L (ref 3.5–5.1)
Sodium: 135 mmol/L (ref 135–145)
Total Bilirubin: 1.3 mg/dL — ABNORMAL HIGH (ref 0.3–1.2)
Total Protein: 5.4 g/dL — ABNORMAL LOW (ref 6.5–8.1)

## 2020-07-11 LAB — CULTURE, BLOOD (ROUTINE X 2)
Culture: NO GROWTH
Culture: NO GROWTH
Special Requests: ADEQUATE
Special Requests: ADEQUATE

## 2020-07-11 LAB — PROCALCITONIN: Procalcitonin: 4.9 ng/mL

## 2020-07-11 LAB — URINE CULTURE: Culture: NO GROWTH

## 2020-07-11 LAB — MAGNESIUM: Magnesium: 2 mg/dL (ref 1.7–2.4)

## 2020-07-11 LAB — LACTIC ACID, PLASMA
Lactic Acid, Venous: 2.2 mmol/L (ref 0.5–1.9)
Lactic Acid, Venous: 2 mmol/L (ref 0.5–1.9)
Lactic Acid, Venous: 2.3 mmol/L (ref 0.5–1.9)

## 2020-07-11 LAB — PHOSPHORUS: Phosphorus: 1.7 mg/dL — ABNORMAL LOW (ref 2.5–4.6)

## 2020-07-11 LAB — AMMONIA: Ammonia: 31 umol/L (ref 9–35)

## 2020-07-11 MED ORDER — ACETAMINOPHEN 500 MG PO TABS
1000.0000 mg | ORAL_TABLET | Freq: Three times a day (TID) | ORAL | Status: DC
  Administered 2020-07-11 – 2020-07-15 (×13): 1000 mg via ORAL
  Filled 2020-07-11 (×13): qty 2

## 2020-07-11 MED ORDER — KETOROLAC TROMETHAMINE 15 MG/ML IJ SOLN
15.0000 mg | Freq: Once | INTRAMUSCULAR | Status: AC
  Administered 2020-07-11: 15 mg via INTRAVENOUS
  Filled 2020-07-11: qty 1

## 2020-07-11 MED ORDER — POTASSIUM PHOSPHATES 15 MMOLE/5ML IV SOLN
30.0000 mmol | Freq: Once | INTRAVENOUS | Status: AC
  Administered 2020-07-11: 30 mmol via INTRAVENOUS
  Filled 2020-07-11: qty 10

## 2020-07-11 NOTE — Progress Notes (Signed)
PROGRESS NOTE  Ronald Huerta TMH:[id number] DOB: 11/20/1934   PCP: Barbette Reichmann, MD  Patient is from: Home  DOA: 07/05/2020 LOS: 6  Chief complaints: Left foot infection  Brief Narrative / Interim history: 86 year old M with PMH of peripheral neuropathy, CAD, hypothyroidism, impaired hearing, RLS, IBS, anxiety and BPH presenting with worsening left foot/ankle infection that has not improved with p.o. Bactrim outpatient.  He has been admitted for left ankle wound infection/cellulitis.  Has been started on vancomycin and cefepime.  MRI showed ulceration with tenosynovitis but no abscess or gas or osseous involvement.  Podiatry consulted and recommended antibiotics and signed off.  Superficial wound culture grew Pseudomonas with moderate GPC's.  Blood cultures NGTD.  Infectious disease consulted  and de-escalated antibiotics to Cipro and Bactrim.  Patient's leukocytosis jumped from 5.4-40.  Procalcitonin 4.44.  Inflammatory markers remains elevated.  UA, urine culture, blood cultures obtained.  Antibiotics escalated to IV Zosyn and daptomycin per ID recommendation.  CT abdomen and pelvis without significant finding.  CT left foot without significant change from his MRI.  Repeat blood cultures NGTD.  Urine culture pending.  Subjective: Seen and examined earlier this morning with the help of sign language interpreter with ID number [id number].  No major events overnight of this morning.  Patient is awake, alert and oriented to self and place but not time.  He has no insight into why he is in the hospital. He talks about baby's to be born.  He says he feels like a baby.   Objective: Vitals:   07/11/20 0422 07/11/20 0903 07/11/20 1213 07/11/20 1231  BP: (!) 118/52 (!) 121/59 132/60   Pulse: (!) 104 89 100   Resp: 20 18 18    Temp: 99 F (37.2 C) 98.3 F (36.8 C) 98 F (36.7 C) 99.4 F (37.4 C)  TempSrc: Oral   Oral  SpO2: 93% 94% 95%   Weight:      Height:        Intake/Output  Summary (Last 24 hours) at 07/11/2020 1419 Last data filed at 07/11/2020 1217 Gross per 24 hour  Intake --  Output 1100 ml  Net -1100 ml   Filed Weights   07/05/20 0540  Weight: 76.2 kg    Examination:   GENERAL: No apparent distress.  Nontoxic. HEENT: MMM.  Vision and hearing grossly intact.  NECK: Supple.  No apparent JVD.  RESP: On RA.  No IWOB.  Fair aeration bilaterally. CVS:  RRR. Heart sounds normal.  ABD/GI/GU: BS+. Abd soft, NTND.  Indwelling Foley.  Clear yellow urine MSK/EXT:  Moves extremities. No apparent deformity. No edema.  SKIN: Ace wrap over LLE.  No swelling or erythema distally or proximally. NEURO: Awake and alert. Oriented to self and place.  No insight into why he is in the hospital.  No apparent focal neuro deficit. PSYCH: Somewhat anxious.  No distress or agitation.   Procedures:  None  Microbiology summarized: 6/5-COVID-19 and influenza PCR nonreactive. 6/5-urine culture NGTD. 6/5-superficial wound culture with Pseudomonas aeruginosa and GPC 6/5-blood cultures NGTD. 6/10-urine culture pending 6/10-blood culture NGTD  Assessment & Plan: Sepsis in the setting of left Ankle wound infection, cellulitis: patient's leukocytosis up from 5.4-40 but improving.  Inflammatory markers remains elevated.  Pro-Cal 4.4.  Mild lactic acidosis.  Culture data as above.  CT abdomen and pelvis without significant finding. Urine culture pending. MRI on 6/5 and CT on 6/10 concerning for cellulitis and tenosynovitis but no abscess or gas. -Evaluated by podiatry, Dr. 8/10 on  admission and surgery was not indicated. -Broad-spectrum antibiotics with daptomycin and Zosyn 6/10>>> -Continue IV fluid -ID following.  Acute metabolic encephalopathy/delirium-oriented to self and place but no insight into why he is here.  Could be due to the above.  He was also on Percocet and Cipro which could have contributed. -Treat treatable causes -Reorientation and delirium  precautions -Scheduled Tylenol for pain.  Avoid opiates or other sedating medications.   BPH with LUTS-indwelling Foley catheter in place. -Continue Flomax and Proscar. -Started bethanechol on 6/10. -Voiding trial today.  New onset A. Fib: patient had brief A. fib with RVR to 140s but quickly converted to sinus rhythm without intervention.  He was not symptomatic.  Has no history of A. fib in his chart.  TTE in 2019 basically normal.  CHA2DS2-VASc score 2 (for age). -Consider rate control if further A. fib with RVR -Defer anticoagulation given low CHA2DS2-VASc score and low A. fib burden -Optimize electrolytes -Continue telemetry  Hypokalemia/hypophosphatemia -IV potassium phosphate 30 mmol x 1  Hypothyroidism:  -Continue with Synthroid   Deafness; -Using sign language interpreter   GERD: Continue with PPI   Abdominal pain/constipation: CT scan is negative but suggests constipation. -MiraLAX, Senokot-S and mag citrate as needed based on severity   Mild hyponatremia: Resolved.   Anemia of chronic disease: Anemia panel basically normal.  Mild drop in Hgb likely dilutional. Recent Labs    02/21/20 0526 02/22/20 0527 07/05/20 0545 07/06/20 0623 07/07/20 0514 07/08/20 0458 07/09/20 0339 07/10/20 0509 07/10/20 0743 07/11/20 0458  HGB 11.5* 9.5* 10.6* 10.0* 8.8* 9.1* 8.9* 9.9* 9.8* 8.6*  -Monitor H&H   Language barrier -Using sign interpreter.  Body mass index is 24.81 kg/m.         DVT prophylaxis:  enoxaparin (LOVENOX) injection 40 mg Start: 07/06/20 1500  Code Status: Full code Family Communication: Patient and Charity fundraiser.  No family member at bedside. Level of care: Med-Surg Status is: Inpatient  Remains inpatient appropriate because:IV treatments appropriate due to intensity of illness or inability to take PO and Inpatient level of care appropriate due to severity of illness   Dispo: The patient is from: Home              Anticipated d/c is to: Home with home  health             Patient currently is not medically stable to d/c.   Difficult to place patient No       Consultants:  Podiatry Infectious disease   Sch Meds:  Scheduled Meds:  acetaminophen  1,000 mg Oral Q8H   bethanechol  25 mg Oral TID   Chlorhexidine Gluconate Cloth  6 each Topical Daily   enoxaparin (LOVENOX) injection  40 mg Subcutaneous Q24H   ferrous sulfate  325 mg Oral Q breakfast   finasteride  5 mg Oral Daily   folic acid  500 mcg Oral Daily   gabapentin  100 mg Oral TID   levothyroxine  25 mcg Oral Q0600   multivitamin with minerals  1 tablet Oral Daily   omega-3 acid ethyl esters  1 g Oral Daily   pantoprazole  40 mg Oral Daily   polyethylene glycol  17 g Oral BID   senna  1 tablet Oral BID   tamsulosin  0.4 mg Oral Daily   vitamin B-12  1,000 mcg Oral Daily   Continuous Infusions:  sodium chloride 100 mL/hr at 07/11/20 1233   DAPTOmycin (CUBICIN)  IV 450 mg (07/10/20 1210)  piperacillin-tazobactam (ZOSYN)  IV 3.375 g (07/11/20 0531)   potassium PHOSPHATE IVPB (in mmol) 30 mmol (07/11/20 1024)   promethazine (PHENERGAN) injection (IM or IVPB) 12.5 mg (07/08/20 2324)   PRN Meds:.bisacodyl, magnesium citrate, ondansetron (ZOFRAN) IV, polyvinyl alcohol, promethazine (PHENERGAN) injection (IM or IVPB)  Antimicrobials: Anti-infectives (From admission, onward)    Start     Dose/Rate Route Frequency Ordered Stop   07/10/20 1200  DAPTOmycin (CUBICIN) 450 mg in sodium chloride 0.9 % IVPB        6 mg/kg  76.2 kg 118 mL/hr over 30 Minutes Intravenous Daily 07/10/20 1035     07/10/20 1130  piperacillin-tazobactam (ZOSYN) IVPB 3.375 g        3.375 g 12.5 mL/hr over 240 Minutes Intravenous Every 8 hours 07/10/20 1035     07/10/20 0800  ciprofloxacin (CIPRO) tablet 500 mg  Status:  Discontinued        500 mg Oral 2 times daily 07/09/20 1646 07/10/20 0937   07/09/20 0800  sulfamethoxazole-trimethoprim (BACTRIM DS) 800-160 MG per tablet 1 tablet  Status:   Discontinued        1 tablet Oral Every 12 hours 07/08/20 1604 07/10/20 0937   07/08/20 2320  vancomycin (VANCOREADY) IVPB 750 mg/150 mL  Status:  Discontinued       See Hyperspace for full Linked Orders Report.   750 mg 150 mL/hr over 60 Minutes Intravenous Every 12 hours 07/08/20 1600 07/08/20 1604   07/08/20 0900  ceFEPIme (MAXIPIME) 2 g in sodium chloride 0.9 % 100 mL IVPB        2 g 200 mL/hr over 30 Minutes Intravenous Every 8 hours 07/08/20 0831 07/10/20 0722   07/06/20 1700  ceFEPIme (MAXIPIME) 2 g in sodium chloride 0.9 % 100 mL IVPB  Status:  Discontinued        2 g 200 mL/hr over 30 Minutes Intravenous Every 12 hours 07/06/20 1526 07/08/20 0831   07/06/20 0800  vancomycin (VANCOREADY) IVPB 1250 mg/250 mL  Status:  Discontinued       See Hyperspace for full Linked Orders Report.   1,250 mg 166.7 mL/hr over 90 Minutes Intravenous Every 24 hours 07/05/20 0756 07/08/20 1600   07/06/20 0600  cefTRIAXone (ROCEPHIN) 2 g in sodium chloride 0.9 % 100 mL IVPB  Status:  Discontinued        2 g 200 mL/hr over 30 Minutes Intravenous Every 24 hours 07/05/20 0751 07/06/20 1518   07/05/20 0800  vancomycin (VANCOREADY) IVPB 500 mg/100 mL       See Hyperspace for full Linked Orders Report.   500 mg 100 mL/hr over 60 Minutes Intravenous  Once 07/05/20 0756 07/05/20 1219   07/05/20 0630  cefTRIAXone (ROCEPHIN) 2 g in sodium chloride 0.9 % 100 mL IVPB        2 g 200 mL/hr over 30 Minutes Intravenous  Once 07/05/20 0629 07/05/20 0709   07/05/20 0630  vancomycin (VANCOCIN) IVPB 1000 mg/200 mL premix        1,000 mg 200 mL/hr over 60 Minutes Intravenous  Once 07/05/20 0629 07/05/20 0812        I have personally reviewed the following labs and images: CBC: Recent Labs  Lab 07/05/20 0545 07/06/20 0623 07/08/20 0458 07/09/20 0339 07/10/20 0509 07/10/20 0743 07/11/20 0458  WBC 11.0*   < > 5.8 5.4 38.8* 40.5* 26.0*  NEUTROABS 9.5*  --   --   --   --  38.7*  --   HGB 10.6*   < >  9.1* 8.9*  9.9* 9.8* 8.6*  HCT 31.4*   < > 26.6* 26.7* 28.6* 28.5* 24.8*  MCV 98.7   < > 97.8 99.6 96.9 97.9 96.1  PLT 179   < > 172 189 220 225 195   < > = values in this interval not displayed.   BMP &GFR Recent Labs  Lab 07/07/20 0514 07/07/20 0806 07/08/20 0458 07/09/20 0339 07/10/20 0509 07/11/20 0458  NA 139  --  137 140 138 135  K 4.3  --  3.6 4.0 3.9 3.7  CL 106  --  106 109 103 105  CO2 28  --  27 27 25 26   GLUCOSE 88  --  90 91 112* 101*  BUN 19  --  11 12 13 22   CREATININE 0.94  --  0.68 0.80 0.91  0.91 0.89  CALCIUM 8.2*  --  7.9* 8.3* 8.1* 7.9*  MG  --  1.9  --  1.8 2.0 2.0  PHOS  --   --   --  2.6 2.9 1.7*   Estimated Creatinine Clearance: 62.9 mL/min (by C-G formula based on SCr of 0.89 mg/dL). Liver & Pancreas: Recent Labs  Lab 07/05/20 0545 07/09/20 0339 07/10/20 0509 07/11/20 0458  AST 43*  --   --  69*  ALT 29  --   --  71*  ALKPHOS 100  --   --  121  BILITOT 1.6*  --   --  1.3*  PROT 7.1  --   --  5.4*  ALBUMIN 3.9 2.8* 2.9* 2.5*   Recent Labs  Lab 07/05/20 0545  LIPASE 46   Recent Labs  Lab 07/11/20 0458  AMMONIA 31   Diabetic: No results for input(s): HGBA1C in the last 72 hours. Recent Labs  Lab 07/09/20 2053  GLUCAP 97   Cardiac Enzymes: Recent Labs  Lab 07/10/20 1054  CKTOTAL 202   No results for input(s): PROBNP in the last 8760 hours. Coagulation Profile: Recent Labs  Lab 07/06/20 0623  INR 1.1   Thyroid Function Tests: Recent Labs    07/10/20 0509  TSH 0.947   Lipid Profile: No results for input(s): CHOL, HDL, LDLCALC, TRIG, CHOLHDL, LDLDIRECT in the last 72 hours. Anemia Panel: No results for input(s): VITAMINB12, FOLATE, FERRITIN, TIBC, IRON, RETICCTPCT in the last 72 hours.  Urine analysis:    Component Value Date/Time   COLORURINE AMBER (A) 07/10/2020 0901   APPEARANCEUR HAZY (A) 07/10/2020 0901   APPEARANCEUR Clear 12/14/2013 2131   LABSPEC 1.027 07/10/2020 0901   LABSPEC 1.025 12/14/2013 2131   PHURINE  5.0 07/10/2020 0901   GLUCOSEU NEGATIVE 07/10/2020 0901   GLUCOSEU Negative 12/14/2013 2131   HGBUR MODERATE (A) 07/10/2020 0901   BILIRUBINUR NEGATIVE 07/10/2020 0901   BILIRUBINUR Negative 12/14/2013 2131   KETONESUR 5 (A) 07/10/2020 0901   PROTEINUR 30 (A) 07/10/2020 0901   NITRITE NEGATIVE 07/10/2020 0901   LEUKOCYTESUR LARGE (A) 07/10/2020 0901   LEUKOCYTESUR Negative 12/14/2013 2131   Sepsis Labs: Invalid input(s): PROCALCITONIN, LACTICIDVEN  Microbiology: Recent Results (from the past 240 hour(s))  Culture, blood (routine x 2)     Status: None (Preliminary result)   Collection Time: 07/05/20  5:45 AM   Specimen: BLOOD  Result Value Ref Range Status   Specimen Description BLOOD RHAND  Final   Special Requests   Final    BOTTLES DRAWN AEROBIC AND ANAEROBIC Blood Culture adequate volume   Culture   Final    NO GROWTH 4 DAYS  Performed at [facility], [address] [place], [place] [address]    Report Status PENDING  Incomplete  Culture, blood (routine x 2)     Status: None (Preliminary result)   Collection Time: 07/05/20  5:45 AM   Specimen: BLOOD  Result Value Ref Range Status   Specimen Description BLOOD  LEFT HAND  Final   Special Requests   Final    BOTTLES DRAWN AEROBIC AND ANAEROBIC Blood Culture adequate volume   Culture   Final    NO GROWTH 4 DAYS Performed at [facility], [address] [place], [place] [address]    Report Status PENDING  Incomplete  Urine culture     Status: None   Collection Time: 07/05/20  5:45 AM   Specimen: Urine, Random  Result Value Ref Range Status   Specimen Description   Final    URINE, RANDOM Performed at [facility], [address] [place], [place] [address]    Special Requests   Final    NONE Performed at [facility], [address] [place], [place] [address]    Culture   Final    NO GROWTH Performed at [facility], [address] [place], [place] [address]     Report Status 07/06/2020 FINAL  Final  Resp Panel by RT-PCR (Flu A&B, Covid) Nasopharyngeal Swab     Status: None   Collection Time: 07/05/20  5:45 AM   Specimen: Nasopharyngeal Swab; Nasopharyngeal(NP) swabs in vial transport medium  Result Value Ref Range Status   SARS Coronavirus 2 by RT PCR NEGATIVE NEGATIVE Final    Comment: (NOTE) SARS-CoV-2 target nucleic acids are NOT DETECTED.  The SARS-CoV-2 RNA is generally detectable in upper respiratory specimens during the acute phase of infection. The lowest concentration of SARS-CoV-2 viral copies this assay can detect is 138 copies/mL. A negative result does not preclude SARS-Cov-2 infection and should not be used as the sole basis for treatment or other patient management decisions. A negative result may occur with  improper specimen collection/handling, submission of specimen other than nasopharyngeal swab, presence of viral mutation(s) within the areas targeted by this assay, and inadequate number of viral copies(<138 copies/mL). A negative result must be combined with clinical observations, patient history, and epidemiological information. The expected result is Negative.  Fact Sheet for Patients:  BloggerCourse.comhttps://www.fda.gov/media/152166/download  Fact Sheet for Healthcare Providers:  SeriousBroker.ithttps://www.fda.gov/media/152162/download  This test is no t yet approved or cleared by the [place] FDA and  has been authorized for detection and/or diagnosis of SARS-CoV-2 by FDA under an Emergency Use Authorization (EUA). This EUA will remain  in effect (meaning this test can be used) for the duration of the COVID-19 declaration under Section 564(b)(1) of the Act, 21 U.S.C.section 360bbb-3(b)(1), unless the authorization is terminated  or revoked sooner.       Influenza A by PCR NEGATIVE NEGATIVE Final   Influenza B by PCR NEGATIVE NEGATIVE Final    Comment: (NOTE) The Xpert Xpress SARS-CoV-2/FLU/RSV plus assay is intended as an aid in  the diagnosis of influenza from Nasopharyngeal swab specimens and should not be used as a sole basis for treatment. Nasal washings and aspirates are unacceptable for Xpert Xpress SARS-CoV-2/FLU/RSV testing.  Fact Sheet for Patients: BloggerCourse.comhttps://www.fda.gov/media/152166/download  Fact Sheet for Healthcare Providers: SeriousBroker.ithttps://www.fda.gov/media/152162/download  This test is not yet approved or cleared by the [place] FDA and has been authorized for detection and/or diagnosis of SARS-CoV-2 by FDA under an Emergency Use Authorization (EUA).  This EUA will remain in effect (meaning this test can be used) for the duration of the COVID-19 declaration under Section 564(b)(1) of the Act, 21 U.S.C. section 360bbb-3(b)(1), unless the authorization is terminated or revoked.  Performed at [facility], [address] [place], [place] [address]   Aerobic Culture w Gram Stain (superficial specimen)     Status: None   Collection Time: 07/05/20  5:45 AM   Specimen: Ankle  Result Value Ref Range Status   Specimen Description ANKLE  Final   Special Requests LEFT  Final   Gram Stain   Final    NO WBC SEEN MODERATE GRAM POSITIVE COCCI MODERATE GRAM VARIABLE ROD Performed at [facility], [address], [place], [place] [address]    Culture FEW PSEUDOMONAS AERUGINOSA  Final   Report Status 07/07/2020 FINAL  Final   Organism ID, Bacteria PSEUDOMONAS AERUGINOSA  Final      Susceptibility   Pseudomonas aeruginosa - MIC*    CEFTAZIDIME <=1 SENSITIVE Sensitive     CIPROFLOXACIN <=0.25 SENSITIVE Sensitive     GENTAMICIN <=1 SENSITIVE Sensitive     IMIPENEM 1 SENSITIVE Sensitive     PIP/TAZO <=4 SENSITIVE Sensitive     CEFEPIME 2 SENSITIVE Sensitive     * FEW PSEUDOMONAS AERUGINOSA  CULTURE, BLOOD (ROUTINE X 2) w Reflex to ID Panel     Status: None (Preliminary result)   Collection Time: 07/10/20 10:52 AM   Specimen: BLOOD LEFT HAND  Result Value Ref Range Status   Specimen  Description BLOOD LEFT HAND  Final   Special Requests   Final    BOTTLES DRAWN AEROBIC AND ANAEROBIC Blood Culture adequate volume   Culture   Final    NO GROWTH < 24 HOURS Performed at [facility], [address] [place], [place] [address]    Report Status PENDING  Incomplete  CULTURE, BLOOD (ROUTINE X 2) w Reflex to ID Panel     Status: None (Preliminary result)   Collection Time: 07/10/20 10:54 AM   Specimen: Left Antecubital; Blood  Result Value Ref Range Status   Specimen Description LEFT ANTECUBITAL  Final   Special Requests   Final    BOTTLES DRAWN AEROBIC AND ANAEROBIC Blood Culture adequate volume   Culture   Final    NO GROWTH < 24 HOURS Performed at [facility], [address], [place], [place] [address]    Report Status PENDING  Incomplete    Radiology Studies: CT ABDOMEN PELVIS W CONTRAST  Result Date: 07/10/2020 CLINICAL DATA:  Suspected abdominal infection. EXAM: CT ABDOMEN AND PELVIS WITH CONTRAST TECHNIQUE: Multidetector CT imaging of the abdomen and pelvis was performed using the standard protocol following bolus administration of intravenous contrast. CONTRAST:  OMNIPAQUE IOHEXOL 300 MG/ML  SOLN COMPARISON:  CT abdomen pelvis dated July 05, 2020. FINDINGS: Lower chest: New small bilateral pleural effusions with progressive bilateral lower lobe atelectasis. Hepatobiliary: No focal liver abnormality is seen. Status post cholecystectomy. No biliary dilatation. Pancreas: Unremarkable. No pancreatic ductal dilatation or surrounding inflammatory changes. Spleen: Normal in size without focal abnormality. Adrenals/Urinary Tract: Adrenal glands are unremarkable. Kidneys are normal, without renal calculi, focal lesion, or hydronephrosis. Bladder is partially decompressed by Foley catheter. Stomach/Bowel: Stomach is within normal limits. Appendix appears normal. No evidence of bowel wall thickening, distention, or inflammatory changes. Vascular/Lymphatic:  Aortic atherosclerosis. No enlarged abdominal or pelvic lymph nodes. Reproductive: Unchanged mild prostatomegaly. Other: Unchanged small fat containing right inguinal hernia. No free  fluid or pneumoperitoneum. Musculoskeletal: No acute or significant osseous findings. Prior right total hip arthroplasty. IMPRESSION: 1. No acute intra-abdominal process. 2. New small bilateral pleural effusions with progressive bilateral lower lobe atelectasis. 3. Aortic Atherosclerosis (ICD10-I70.0). Electronically Signed   By: Obie Dredge M.D.   On: 07/10/2020 18:12   CT ANKLE LEFT W CONTRAST  Result Date: 07/10/2020 CLINICAL DATA:  Left ankle ulcer. EXAM: CT OF THE LEFT ANKLE WITH CONTRAST TECHNIQUE: Multidetector CT imaging of the left ankle was performed following the standard protocol during bolus administration of intravenous contrast. CONTRAST:  OMNIPAQUE IOHEXOL 300 MG/ML  SOLN COMPARISON:  MRI left ankle dated July 05, 2020. FINDINGS: Bones/Joint/Cartilage No bony destruction or periosteal reaction. No fracture or dislocation. Joint spaces are relatively preserved. Osteopenia. No joint effusion. Ligaments Ligaments are suboptimally evaluated by CT. Muscles and Tendons Grossly intact. Soft tissue Unchanged soft tissue ulceration along the posteromedial ankle (series 8, image 174). Associated medial ankle soft tissue swelling extending into the lower leg. Milder lateral ankle soft tissue swelling. No fluid collection or subcutaneous emphysema. No soft tissue mass. Innumerable phleboliths in the medial lower leg. IMPRESSION: 1. Unchanged soft tissue ulceration along the posteromedial ankle with surrounding soft tissue swelling. No abscess or CT evidence of osteomyelitis. Electronically Signed   By: Obie Dredge M.D.   On: 07/10/2020 18:16      Ayda Tancredi T. Yunior Jain [facility]  If 7PM-7AM, please contact night-coverage [email or web address] 07/11/2020, 2:19 PM

## 2020-07-12 LAB — CBC
HCT: 25.4 % — ABNORMAL LOW (ref 39.0–52.0)
Hemoglobin: 8.8 g/dL — ABNORMAL LOW (ref 13.0–17.0)
MCH: 33.5 pg (ref 26.0–34.0)
MCHC: 34.6 g/dL (ref 30.0–36.0)
MCV: 96.6 fL (ref 80.0–100.0)
Platelets: 225 10*3/uL (ref 150–400)
RBC: 2.63 MIL/uL — ABNORMAL LOW (ref 4.22–5.81)
RDW: 16.3 % — ABNORMAL HIGH (ref 11.5–15.5)
WBC: 14.6 10*3/uL — ABNORMAL HIGH (ref 4.0–10.5)
nRBC: 0 % (ref 0.0–0.2)

## 2020-07-12 LAB — PHOSPHORUS: Phosphorus: 1.9 mg/dL — ABNORMAL LOW (ref 2.5–4.6)

## 2020-07-12 LAB — COMPREHENSIVE METABOLIC PANEL
ALT: 59 U/L — ABNORMAL HIGH (ref 0–44)
AST: 52 U/L — ABNORMAL HIGH (ref 15–41)
Albumin: 2.3 g/dL — ABNORMAL LOW (ref 3.5–5.0)
Alkaline Phosphatase: 152 U/L — ABNORMAL HIGH (ref 38–126)
Anion gap: 4 — ABNORMAL LOW (ref 5–15)
BUN: 20 mg/dL (ref 8–23)
CO2: 26 mmol/L (ref 22–32)
Calcium: 7.8 mg/dL — ABNORMAL LOW (ref 8.9–10.3)
Chloride: 105 mmol/L (ref 98–111)
Creatinine, Ser: 0.86 mg/dL (ref 0.61–1.24)
GFR, Estimated: >60 mL/min (ref >60)
Glucose, Bld: 101 mg/dL — ABNORMAL HIGH (ref 70–99)
Potassium: 3.6 mmol/L (ref 3.5–5.1)
Sodium: 135 mmol/L (ref 135–145)
Total Bilirubin: 1.1 mg/dL (ref 0.3–1.2)
Total Protein: 5.3 g/dL — ABNORMAL LOW (ref 6.5–8.1)

## 2020-07-12 LAB — AMMONIA: Ammonia: 10 umol/L (ref 9–35)

## 2020-07-12 LAB — MAGNESIUM: Magnesium: 1.9 mg/dL (ref 1.7–2.4)

## 2020-07-12 LAB — PROCALCITONIN: Procalcitonin: 3.4 ng/mL

## 2020-07-12 MED ORDER — POTASSIUM PHOSPHATES 15 MMOLE/5ML IV SOLN
30.0000 mmol | Freq: Once | INTRAVENOUS | Status: AC
  Administered 2020-07-12: 30 mmol via INTRAVENOUS
  Filled 2020-07-12: qty 10

## 2020-07-12 NOTE — Progress Notes (Signed)
PROGRESS NOTE  Ronald Huerta [place] RUE:[id number] DOB: 04/05/1934   PCP: Barbette Reichmann, MD  Patient is from: Home  DOA: 07/05/2020 LOS: 7  Chief complaints: Left foot infection  Brief Narrative / Interim history: 86 year old M with PMH of peripheral neuropathy, CAD, hypothyroidism, impaired hearing, RLS, IBS, anxiety and BPH presenting with worsening left foot/ankle infection that has not improved with p.o. Bactrim outpatient.  He has been admitted for left ankle wound infection/cellulitis.  Has been started on vancomycin and cefepime.  MRI showed ulceration with tenosynovitis but no abscess or gas or osseous involvement.  Podiatry consulted and recommended antibiotics and signed off.  Superficial wound culture grew Pseudomonas with moderate GPC's.  Blood cultures NGTD.  Infectious disease consulted  and de-escalated antibiotics to Cipro and Bactrim.  Patient's leukocytosis jumped from 5.4-40.  Procalcitonin 4.44.  Inflammatory markers remains elevated.  UA, urine culture, blood cultures obtained.  Antibiotics escalated to IV Zosyn and daptomycin per ID recommendation.  CT abdomen and pelvis without significant finding.  CT left foot without significant change from his MRI.  Repeat blood and urine cultures NGTD.  Leukocytosis improving.  Subjective: Seen and examined earlier this morning with the help of sign language interpreter with ID number [id number].  He says he feels better today.  He denies pain.  He says he has not gotten his breakfast yet.  He feels hungry.  He is oriented to self and place.  Has no insight into what brought him to the hospital.   Objective: Vitals:   07/12/20 0022 07/12/20 0618 07/12/20 0732 07/12/20 1111  BP: (!) 124/57 (!) 125/55 117/61 (!) 125/53  Pulse: 92 94 92 89  Resp: 20 18 18 16   Temp: 98.8 F (37.1 C) 98.8 F (37.1 C) 98.8 F (37.1 C) 98.5 F (36.9 C)  TempSrc:  Oral Oral Oral  SpO2: 93% 91% 90% 91%  Weight:      Height:        Intake/Output  Summary (Last 24 hours) at 07/12/2020 1529 Last data filed at 07/12/2020 1146 Gross per 24 hour  Intake 3325.39 ml  Output 1935 ml  Net 1390.39 ml   Filed Weights   07/05/20 0540  Weight: 76.2 kg    Examination:   GENERAL: No apparent distress.  Nontoxic. HEENT: MMM.  Vision grossly intact. NECK: Supple.  No apparent JVD.  RESP: On RA.  No IWOB.  Fair aeration bilaterally. CVS:  RRR. Heart sounds normal.  ABD/GI/GU: BS+. Abd soft, NTND.  MSK/EXT:  Moves extremities.  Ace wrap over LLE. Some pedal swelling/edema SKIN: Ace wrap over LLE. NEURO: Awake and alert. Oriented only to self and place.  No apparent focal neuro deficit. PSYCH: Calm. Normal affect.    Procedures:  None  Microbiology summarized: 6/5-COVID-19 and influenza PCR nonreactive. 6/5-urine culture NGTD. 6/5-superficial wound culture with Pseudomonas aeruginosa and GPC 6/5-blood cultures NGTD. 6/10-urine culture NGTD 6/10-blood culture NGTD  Assessment & Plan: Sepsis in the setting of left Ankle wound infection, cellulitis: culture data as above.  CT abdomen and pelvis without significant finding. MRI on 6/5 and CT on 6/10 concerning for cellulitis and tenosynovitis but no abscess or gas.  Sepsis physiology, procalcitonin and clinically improving. -Evaluated by podiatry, Dr. 8/10 on admission and surgery was not indicated. -Broad-spectrum antibiotics with daptomycin and Zosyn 6/10>>> -Continue IV fluid -ID following.  Acute metabolic encephalopathy/delirium- could be due to the above.  He was also on Percocet which could have contributed.  There could be some degree of underlying  cognitive impairment.  Improved. -Treat treatable causes -Reorientation and delirium precautions -Scheduled Tylenol for pain.  Avoid opiates or other sedating medications.   BPH with LUTS-indwelling Foley catheter in place.  Failed voiding trial on 6/11. -Continue Flomax and Proscar. -Started bethanechol on 6/10. -Voiding trial  prior to discharge  New onset A. Fib: patient had brief A. fib with RVR to 140s but quickly converted to sinus rhythm without intervention.  He was not symptomatic.  Has no history of A. fib in his chart.  TTE in 2019 basically normal.  CHA2DS2-VASc score 2 (for age). -Consider rate control if further A. fib with RVR -Defer anticoagulation given low CHA2DS2-VASc score and low A. fib burden -Optimize electrolytes -Continue telemetry  Hypokalemia/hypophosphatemia -IV potassium phosphate 30 mmol x 1  Elevated liver enzymes: Likely due to #1.  Improving. -Continue monitoring  Hypothyroidism:  -Continue with Synthroid   Deafness; -Using sign language interpreter   GERD: Continue with PPI   Abdominal pain/constipation: CT scan is negative but suggests constipation. -MiraLAX, Senokot-S and mag citrate as needed based on severity   Mild hyponatremia: Resolved.   Anemia of chronic disease: Anemia panel basically normal.  Mild drop in Hgb likely dilutional. Recent Labs    02/22/20 0527 07/05/20 0545 07/06/20 0623 07/07/20 0514 07/08/20 0458 07/09/20 0339 07/10/20 0509 07/10/20 0743 07/11/20 0458 07/12/20 0546  HGB 9.5* 10.6* 10.0* 8.8* 9.1* 8.9* 9.9* 9.8* 8.6* 8.8*  -Monitor H&H   Language barrier -Using sign interpreter.  Body mass index is 24.81 kg/m.         DVT prophylaxis:  enoxaparin (LOVENOX) injection 40 mg Start: 07/06/20 1500  Code Status: Full code Family Communication: Patient and Charity fundraiserN.  No family member at bedside. Level of care: Med-Surg Status is: Inpatient  Remains inpatient appropriate because:IV treatments appropriate due to intensity of illness or inability to take PO and Inpatient level of care appropriate due to severity of illness   Dispo: The patient is from: Home              Anticipated d/c is to: Home with home health             Patient currently is not medically stable to d/c.   Difficult to place patient No       Consultants:   Podiatry Infectious disease   Sch Meds:  Scheduled Meds:  acetaminophen  1,000 mg Oral Q8H   bethanechol  25 mg Oral TID   Chlorhexidine Gluconate Cloth  6 each Topical Daily   enoxaparin (LOVENOX) injection  40 mg Subcutaneous Q24H   ferrous sulfate  325 mg Oral Q breakfast   finasteride  5 mg Oral Daily   folic acid  500 mcg Oral Daily   gabapentin  100 mg Oral TID   levothyroxine  25 mcg Oral Q0600   multivitamin with minerals  1 tablet Oral Daily   omega-3 acid ethyl esters  1 g Oral Daily   pantoprazole  40 mg Oral Daily   polyethylene glycol  17 g Oral BID   senna  1 tablet Oral BID   tamsulosin  0.4 mg Oral Daily   vitamin B-12  1,000 mcg Oral Daily   Continuous Infusions:  sodium chloride 100 mL/hr at 07/11/20 1709   DAPTOmycin (CUBICIN)  IV 450 mg (07/11/20 2133)   piperacillin-tazobactam (ZOSYN)  IV 3.375 g (07/12/20 1507)   PRN Meds:.bisacodyl, magnesium citrate, ondansetron (ZOFRAN) IV, polyvinyl alcohol  Antimicrobials: Anti-infectives (From admission, onward)    Start  Dose/Rate Route Frequency Ordered Stop   07/10/20 1200  DAPTOmycin (CUBICIN) 450 mg in sodium chloride 0.9 % IVPB        6 mg/kg  76.2 kg 118 mL/hr over 30 Minutes Intravenous Daily 07/10/20 1035     07/10/20 1130  piperacillin-tazobactam (ZOSYN) IVPB 3.375 g        3.375 g 12.5 mL/hr over 240 Minutes Intravenous Every 8 hours 07/10/20 1035     07/10/20 0800  ciprofloxacin (CIPRO) tablet 500 mg  Status:  Discontinued        500 mg Oral 2 times daily 07/09/20 1646 07/10/20 0937   07/09/20 0800  sulfamethoxazole-trimethoprim (BACTRIM DS) 800-160 MG per tablet 1 tablet  Status:  Discontinued        1 tablet Oral Every 12 hours 07/08/20 1604 07/10/20 0937   07/08/20 2320  vancomycin (VANCOREADY) IVPB 750 mg/150 mL  Status:  Discontinued       See Hyperspace for full Linked Orders Report.   750 mg 150 mL/hr over 60 Minutes Intravenous Every 12 hours 07/08/20 1600 07/08/20 1604   07/08/20  0900  ceFEPIme (MAXIPIME) 2 g in sodium chloride 0.9 % 100 mL IVPB        2 g 200 mL/hr over 30 Minutes Intravenous Every 8 hours 07/08/20 0831 07/10/20 0722   07/06/20 1700  ceFEPIme (MAXIPIME) 2 g in sodium chloride 0.9 % 100 mL IVPB  Status:  Discontinued        2 g 200 mL/hr over 30 Minutes Intravenous Every 12 hours 07/06/20 1526 07/08/20 0831   07/06/20 0800  vancomycin (VANCOREADY) IVPB 1250 mg/250 mL  Status:  Discontinued       See Hyperspace for full Linked Orders Report.   1,250 mg 166.7 mL/hr over 90 Minutes Intravenous Every 24 hours 07/05/20 0756 07/08/20 1600   07/06/20 0600  cefTRIAXone (ROCEPHIN) 2 g in sodium chloride 0.9 % 100 mL IVPB  Status:  Discontinued        2 g 200 mL/hr over 30 Minutes Intravenous Every 24 hours 07/05/20 0751 07/06/20 1518   07/05/20 0800  vancomycin (VANCOREADY) IVPB 500 mg/100 mL       See Hyperspace for full Linked Orders Report.   500 mg 100 mL/hr over 60 Minutes Intravenous  Once 07/05/20 0756 07/05/20 1219   07/05/20 0630  cefTRIAXone (ROCEPHIN) 2 g in sodium chloride 0.9 % 100 mL IVPB        2 g 200 mL/hr over 30 Minutes Intravenous  Once 07/05/20 0629 07/05/20 0709   07/05/20 0630  vancomycin (VANCOCIN) IVPB 1000 mg/200 mL premix        1,000 mg 200 mL/hr over 60 Minutes Intravenous  Once 07/05/20 0629 07/05/20 0812        I have personally reviewed the following labs and images: CBC: Recent Labs  Lab 07/09/20 0339 07/10/20 0509 07/10/20 0743 07/11/20 0458 07/12/20 0546  WBC 5.4 38.8* 40.5* 26.0* 14.6*  NEUTROABS  --   --  38.7*  --   --   HGB 8.9* 9.9* 9.8* 8.6* 8.8*  HCT 26.7* 28.6* 28.5* 24.8* 25.4*  MCV 99.6 96.9 97.9 96.1 96.6  PLT 189 220 225 195 225   BMP &GFR Recent Labs  Lab 07/07/20 0806 07/08/20 0458 07/09/20 0339 07/10/20 0509 07/11/20 0458 07/12/20 0546  NA  --  137 140 138 135 135  K  --  3.6 4.0 3.9 3.7 3.6  CL  --  106 109 103 105 105  CO2  --  27 27 25 26 26   GLUCOSE  --  90 91 112* 101* 101*   BUN  --  11 12 13 22 20   CREATININE  --  0.68 0.80 0.91  0.91 0.89 0.86  CALCIUM  --  7.9* 8.3* 8.1* 7.9* 7.8*  MG 1.9  --  1.8 2.0 2.0 1.9  PHOS  --   --  2.6 2.9 1.7* 1.9*   Estimated Creatinine Clearance: 65.1 mL/min (by C-G formula based on SCr of 0.86 mg/dL). Liver & Pancreas: Recent Labs  Lab 07/09/20 0339 07/10/20 0509 07/11/20 0458 07/12/20 0546  AST  --   --  69* 52*  ALT  --   --  71* 59*  ALKPHOS  --   --  121 152*  BILITOT  --   --  1.3* 1.1  PROT  --   --  5.4* 5.3*  ALBUMIN 2.8* 2.9* 2.5* 2.3*   No results for input(s): LIPASE, AMYLASE in the last 168 hours.  Recent Labs  Lab 07/11/20 0458 07/12/20 0546  AMMONIA 31 10   Diabetic: No results for input(s): HGBA1C in the last 72 hours. Recent Labs  Lab 07/09/20 2053  GLUCAP 97   Cardiac Enzymes: Recent Labs  Lab 07/10/20 1054  CKTOTAL 202   No results for input(s): PROBNP in the last 8760 hours. Coagulation Profile: Recent Labs  Lab 07/06/20 0623  INR 1.1   Thyroid Function Tests: Recent Labs    07/10/20 0509  TSH 0.947   Lipid Profile: No results for input(s): CHOL, HDL, LDLCALC, TRIG, CHOLHDL, LDLDIRECT in the last 72 hours. Anemia Panel: No results for input(s): VITAMINB12, FOLATE, FERRITIN, TIBC, IRON, RETICCTPCT in the last 72 hours.  Urine analysis:    Component Value Date/Time   COLORURINE AMBER (A) 07/10/2020 0901   APPEARANCEUR HAZY (A) 07/10/2020 0901   APPEARANCEUR Clear 12/14/2013 2131   LABSPEC 1.027 07/10/2020 0901   LABSPEC 1.025 12/14/2013 2131   PHURINE 5.0 07/10/2020 0901   GLUCOSEU NEGATIVE 07/10/2020 0901   GLUCOSEU Negative 12/14/2013 2131   HGBUR MODERATE (A) 07/10/2020 0901   BILIRUBINUR NEGATIVE 07/10/2020 0901   BILIRUBINUR Negative 12/14/2013 2131   KETONESUR 5 (A) 07/10/2020 0901   PROTEINUR 30 (A) 07/10/2020 0901   NITRITE NEGATIVE 07/10/2020 0901   LEUKOCYTESUR LARGE (A) 07/10/2020 0901   LEUKOCYTESUR Negative 12/14/2013 2131   Sepsis  Labs: Invalid input(s): PROCALCITONIN, LACTICIDVEN  Microbiology: Recent Results (from the past 240 hour(s))  Culture, blood (routine x 2)     Status: None   Collection Time: 07/05/20  5:45 AM   Specimen: BLOOD  Result Value Ref Range Status   Specimen Description BLOOD RHAND  Final   Special Requests   Final    BOTTLES DRAWN AEROBIC AND ANAEROBIC Blood Culture adequate volume   Culture   Final    NO GROWTH 6 DAYS Performed at [facility], [address], [place], [address] [place]    Report Status 07/11/2020 FINAL  Final  Culture, blood (routine x 2)     Status: None   Collection Time: 07/05/20  5:45 AM   Specimen: BLOOD  Result Value Ref Range Status   Specimen Description BLOOD  LEFT HAND  Final   Special Requests   Final    BOTTLES DRAWN AEROBIC AND ANAEROBIC Blood Culture adequate volume   Culture   Final    NO GROWTH 6 DAYS Performed at [facility], [address] [place], [address] [place]  Report Status 07/11/2020 FINAL  Final  Urine culture     Status: None   Collection Time: 07/05/20  5:45 AM   Specimen: Urine, Random  Result Value Ref Range Status   Specimen Description   Final    URINE, RANDOM Performed at [facility], [address], [place], [place] [address]    Special Requests   Final    NONE Performed at [facility], [address], [place], [place] [address]    Culture   Final    NO GROWTH Performed at [facility], [address], [place], [place] [address]    Report Status 07/06/2020 FINAL  Final  Resp Panel by RT-PCR (Flu A&B, Covid) Nasopharyngeal Swab     Status: None   Collection Time: 07/05/20  5:45 AM   Specimen: Nasopharyngeal Swab; Nasopharyngeal(NP) swabs in vial transport medium  Result Value Ref Range Status   SARS Coronavirus 2 by RT PCR NEGATIVE NEGATIVE Final    Comment: (NOTE) SARS-CoV-2 target nucleic acids are NOT DETECTED.  The SARS-CoV-2 RNA is generally detectable in  upper respiratory specimens during the acute phase of infection. The lowest concentration of SARS-CoV-2 viral copies this assay can detect is 138 copies/mL. A negative result does not preclude SARS-Cov-2 infection and should not be used as the sole basis for treatment or other patient management decisions. A negative result may occur with  improper specimen collection/handling, submission of specimen other than nasopharyngeal swab, presence of viral mutation(s) within the areas targeted by this assay, and inadequate number of viral copies(<138 copies/mL). A negative result must be combined with clinical observations, patient history, and epidemiological information. The expected result is Negative.  Fact Sheet for Patients:  BloggerCourse.com  Fact Sheet for Healthcare Providers:  SeriousBroker.it  This test is no t yet approved or cleared by the [place] FDA and  has been authorized for detection and/or diagnosis of SARS-CoV-2 by FDA under an Emergency Use Authorization (EUA). This EUA will remain  in effect (meaning this test can be used) for the duration of the COVID-19 declaration under Section 564(b)(1) of the Act, 21 U.S.C.section 360bbb-3(b)(1), unless the authorization is terminated  or revoked sooner.       Influenza A by PCR NEGATIVE NEGATIVE Final   Influenza B by PCR NEGATIVE NEGATIVE Final    Comment: (NOTE) The Xpert Xpress SARS-CoV-2/FLU/RSV plus assay is intended as an aid in the diagnosis of influenza from Nasopharyngeal swab specimens and should not be used as a sole basis for treatment. Nasal washings and aspirates are unacceptable for Xpert Xpress SARS-CoV-2/FLU/RSV testing.  Fact Sheet for Patients: BloggerCourse.com  Fact Sheet for Healthcare Providers: SeriousBroker.it  This test is not yet approved or cleared by the [place] FDA and has been  authorized for detection and/or diagnosis of SARS-CoV-2 by FDA under an Emergency Use Authorization (EUA). This EUA will remain in effect (meaning this test can be used) for the duration of the COVID-19 declaration under Section 564(b)(1) of the Act, 21 U.S.C. section 360bbb-3(b)(1), unless the authorization is terminated or revoked.  Performed at [facility], [address] [place], [place] [address]   Aerobic Culture w Gram Stain (superficial specimen)     Status: None   Collection Time: 07/05/20  5:45 AM   Specimen: Ankle  Result Value Ref Range Status   Specimen Description ANKLE  Final   Special Requests LEFT  Final   Gram Stain   Final    NO WBC  SEEN MODERATE GRAM POSITIVE COCCI MODERATE GRAM VARIABLE ROD Performed at [facility], [address], [place], [place] [address]    Culture FEW PSEUDOMONAS AERUGINOSA  Final   Report Status 07/07/2020 FINAL  Final   Organism ID, Bacteria PSEUDOMONAS AERUGINOSA  Final      Susceptibility   Pseudomonas aeruginosa - MIC*    CEFTAZIDIME <=1 SENSITIVE Sensitive     CIPROFLOXACIN <=0.25 SENSITIVE Sensitive     GENTAMICIN <=1 SENSITIVE Sensitive     IMIPENEM 1 SENSITIVE Sensitive     PIP/TAZO <=4 SENSITIVE Sensitive     CEFEPIME 2 SENSITIVE Sensitive     * FEW PSEUDOMONAS AERUGINOSA  Urine Culture     Status: None   Collection Time: 07/10/20  9:01 AM   Specimen: Urine, Random  Result Value Ref Range Status   Specimen Description   Final    URINE, RANDOM Performed at [facility], [address], [place], [place] [address]    Special Requests   Final    NONE Performed at [facility], [address], [place], [place] [address]    Culture   Final    NO GROWTH Performed at [facility], [address] [place], [place] [address]    Report Status 07/11/2020 FINAL  Final  CULTURE, BLOOD (ROUTINE X 2) w Reflex to ID Panel     Status: None (Preliminary result)   Collection Time:  07/10/20 10:52 AM   Specimen: BLOOD LEFT HAND  Result Value Ref Range Status   Specimen Description BLOOD LEFT HAND  Final   Special Requests   Final    BOTTLES DRAWN AEROBIC AND ANAEROBIC Blood Culture adequate volume   Culture   Final    NO GROWTH < 24 HOURS Performed at [facility], [address] [place], [place] [address]    Report Status PENDING  Incomplete  CULTURE, BLOOD (ROUTINE X 2) w Reflex to ID Panel     Status: None (Preliminary result)   Collection Time: 07/10/20 10:54 AM   Specimen: Left Antecubital; Blood  Result Value Ref Range Status   Specimen Description LEFT ANTECUBITAL  Final   Special Requests   Final    BOTTLES DRAWN AEROBIC AND ANAEROBIC Blood Culture adequate volume   Culture   Final    NO GROWTH < 24 HOURS Performed at [facility], [address], [place], [place] [address]    Report Status PENDING  Incomplete    Radiology Studies: No results found.    Kimball Appleby T. Kahlia Lagunes [facility]  If 7PM-7AM, please contact night-coverage [email or web address] 07/12/2020, 3:29 PM

## 2020-07-13 LAB — COMPREHENSIVE METABOLIC PANEL
ALT: 52 U/L — ABNORMAL HIGH (ref 0–44)
AST: 43 U/L — ABNORMAL HIGH (ref 15–41)
Albumin: 2.3 g/dL — ABNORMAL LOW (ref 3.5–5.0)
Alkaline Phosphatase: 195 U/L — ABNORMAL HIGH (ref 38–126)
Anion gap: 5 (ref 5–15)
BUN: 12 mg/dL (ref 8–23)
CO2: 24 mmol/L (ref 22–32)
Calcium: 7.6 mg/dL — ABNORMAL LOW (ref 8.9–10.3)
Chloride: 106 mmol/L (ref 98–111)
Creatinine, Ser: 0.75 mg/dL (ref 0.61–1.24)
GFR, Estimated: >60 mL/min (ref >60)
Glucose, Bld: 98 mg/dL (ref 70–99)
Potassium: 3.7 mmol/L (ref 3.5–5.1)
Sodium: 135 mmol/L (ref 135–145)
Total Bilirubin: 1 mg/dL (ref 0.3–1.2)
Total Protein: 5.6 g/dL — ABNORMAL LOW (ref 6.5–8.1)

## 2020-07-13 LAB — CBC
HCT: 25.1 % — ABNORMAL LOW (ref 39.0–52.0)
Hemoglobin: 8.8 g/dL — ABNORMAL LOW (ref 13.0–17.0)
MCH: 33.3 pg (ref 26.0–34.0)
MCHC: 35.1 g/dL (ref 30.0–36.0)
MCV: 95.1 fL (ref 80.0–100.0)
Platelets: 280 10*3/uL (ref 150–400)
RBC: 2.64 MIL/uL — ABNORMAL LOW (ref 4.22–5.81)
RDW: 16.1 % — ABNORMAL HIGH (ref 11.5–15.5)
WBC: 10.9 10*3/uL — ABNORMAL HIGH (ref 4.0–10.5)
nRBC: 0 % (ref 0.0–0.2)

## 2020-07-13 LAB — MAGNESIUM: Magnesium: 1.8 mg/dL (ref 1.7–2.4)

## 2020-07-13 LAB — PHOSPHORUS: Phosphorus: 2.9 mg/dL (ref 2.5–4.6)

## 2020-07-13 MED ORDER — AMOXICILLIN-POT CLAVULANATE 875-125 MG PO TABS
1.0000 | ORAL_TABLET | Freq: Two times a day (BID) | ORAL | Status: DC
  Administered 2020-07-14 – 2020-07-15 (×3): 1 via ORAL
  Filled 2020-07-13 (×3): qty 1

## 2020-07-13 MED ORDER — POTASSIUM CHLORIDE CRYS ER 20 MEQ PO TBCR
40.0000 meq | EXTENDED_RELEASE_TABLET | Freq: Once | ORAL | Status: AC
  Administered 2020-07-13: 40 meq via ORAL
  Filled 2020-07-13: qty 2

## 2020-07-13 MED ORDER — CIPROFLOXACIN HCL 500 MG PO TABS
500.0000 mg | ORAL_TABLET | Freq: Two times a day (BID) | ORAL | Status: DC
  Administered 2020-07-14 – 2020-07-15 (×3): 500 mg via ORAL
  Filled 2020-07-13 (×3): qty 1

## 2020-07-13 MED ORDER — SULFAMETHOXAZOLE-TRIMETHOPRIM 800-160 MG PO TABS: 1.0000 | ORAL_TABLET | Freq: Two times a day (BID) | ORAL | Status: DC

## 2020-07-13 NOTE — Care Management Important Message (Signed)
Important Message  Patient Details  Name: Ronald Huerta MRN: [id number] Date of Birth: 20-Nov-1934   Medicare Important Message Given:  Yes  Used sign language interpreter unit this morning to review the Important Message from Medicare.  We searched for his glasses but did not locate them. He said he would sign it once he found his glasses.  I let him know that would be okay and thanked him for his time.  Olegario Messier A Giancarlos Berendt 07/13/2020, 11:22 AM

## 2020-07-13 NOTE — Progress Notes (Signed)
Physical Therapy Treatment Patient Details Name: Ronald Huerta MRN: [id number] DOB: [birth year] Today's Date: 07/13/2020    History of Present Illness Per MD notes: Pt is an 86 y.o. male with medical history significant of HLD, GERD, hypohyroidism, anxiety, deaf and mute, RLS, IBS, kidney stone, BPH, anemia, peripheral neuropathy, who presents with wound infection left leg.  MD assessment includes: Left Ankle wound infection, cellulitis, BPH with obstruction/lower urinary tract symptoms, and abdominal pain.    PT Comments    Pt seen this am with use of ASL interpreter Scott [phone].  Pt very pleasant and agreeable to OOB activity.  Pt required MinA with HOB raised to transition supine>sit.  Sitting EOB x 10 minutes discussing home layout.  Pt has 5 steps with bilateral rails to enter home, once inside his split level home he has 11 more. Pt was too week to attempt today and had 6/10 c/o pain in hips and feet. ModA needed to raise from bed with increased discomfort sidestepping with RW to recliner.  Pt states he has a w/c at home if he is unable to tolerate ambulation.  Pt will definitely benefit from HHPT.    Follow Up Recommendations  Home health PT;Supervision for mobility/OOB     Equipment Recommendations  None recommended by PT    Recommendations for Other Services       Precautions / Restrictions Precautions Precautions: Fall Restrictions Weight Bearing Restrictions: No    Mobility  Bed Mobility Overal bed mobility: Modified Independent Bed Mobility: Supine to Sit;Sit to Supine     Supine to sit: Min assist;HOB elevated     General bed mobility comments:  (Assistance to manage lines)    Transfers Overall transfer level: Needs assistance Equipment used: Rolling walker (2 wheeled) Transfers: Sit to/from Stand Sit to Stand: From elevated surface;Min assist;Mod assist            Ambulation/Gait Ambulation/Gait assistance: Min guard Gait Distance (Feet):  2 Feet Assistive device: Rolling walker (2 wheeled) Gait Pattern/deviations: Shuffle;Decreased stance time - left         Stairs Stairs: Yes Stairs assistance: Min guard Stair Management: No rails;With walker;Step to pattern;Forwards   General stair comments: Pt has 5 steps to enter, unable to attempt today due to B LE weakness.   Wheelchair Mobility    Modified Rankin (Stroke Patients Only)       Balance Overall balance assessment: Needs assistance Sitting-balance support: Bilateral upper extremity supported;Feet supported Sitting balance-Leahy Scale: Good                                      Cognition Arousal/Alertness: Awake/alert Behavior During Therapy: WFL for tasks assessed/performed Overall Cognitive Status: Within Functional Limits for tasks assessed                                 General Comments: Scott [id number] from ASL utilized throughout session.      Exercises      General Comments General comments (skin integrity, edema, etc.): Left UE IV and foley cath in tact.  R lower leg ace wrapped.      Pertinent Vitals/Pain Pain Assessment: 0-10 Pain Score: 6  Pain Location:  (feet and hips) Pain Descriptors / Indicators: Aching;Discomfort Pain Intervention(s): Monitored during session    Home Living  Prior Function            PT Goals (current goals can now be found in the care plan section) Acute Rehab PT Goals Patient Stated Goal: to go home    Frequency    Min 2X/week      PT Plan      Co-evaluation              AM-PAC PT "6 Clicks" Mobility   Outcome Measure  Help needed turning from your back to your side while in a flat bed without using bedrails?: A Lot Help needed moving from lying on your back to sitting on the side of a flat bed without using bedrails?: A Lot Help needed moving to and from a bed to a chair (including a wheelchair)?: A Lot Help needed standing  up from a chair using your arms (e.g., wheelchair or bedside chair)?: A Lot Help needed to walk in hospital room?: A Lot Help needed climbing 3-5 steps with a railing? : A Lot 6 Click Score: 12    End of Session Equipment Utilized During Treatment: Gait belt Activity Tolerance: Patient tolerated treatment well Patient left: in chair;with call bell/phone within reach;with chair alarm set Nurse Communication: Mobility status PT Visit Diagnosis: Unsteadiness on feet (R26.81);Muscle weakness (generalized) (M62.81);Difficulty in walking, not elsewhere classified (R26.2)     Time: 1010-1058 PT Time Calculation (min) (ACUTE ONLY): 48 min  Charges:  $Therapeutic Exercise: 8-22 mins $Therapeutic Activity: 23-37 mins                    Zadie Cleverly, PTA    Jannet Askew 07/13/2020, 12:11 PM

## 2020-07-13 NOTE — Progress Notes (Signed)
Pharmacy Antibiotic Note  Ronald Huerta is a 86 y.o. male admitted on 07/05/2020 with  ankle wound .  Pharmacy has been consulted for daptomycin and piperacillin/tazobactam dosing. Patient with ankle wound for several months.  Cultures: Jan 2022 with MSSA, E. Faecalis, Bacteroides.   Feb 2022 MRSA June 2022 Pseudomonas  Today, 07/13/2020 Day #9 abx Vanco to PO TMP/SMZ.  Day #4 daptomycin Day #7 Cefepime to Ciprofloxacin to pip.tazo WBC increased to 40.5 6/10, now 10.9 Afebrile 6/10 CK = 202 SCr WNL, stable Repeat Bcx and Ucx unrevealing  Plan: Daptomycin 450mg  IV q24h Check CK weekly while on daptomycin Piperacillin/tazobactam 3.375gm IV q8h over 4h infusion Await pending labs/cultures.  Discuss antibiotic plan with ID.    Height: 5\' 9"  (175.3 cm) Weight: 76.2 kg (168 lb) IBW/kg (Calculated) : 70.7  Temp (24hrs), Avg:98.4 F (36.9 C), Min:98.2 F (36.8 C), Max:98.9 F (37.2 C)  Recent Labs  Lab 07/09/20 0339 07/10/20 0509 07/10/20 0743 07/10/20 1401 07/10/20 1706 07/10/20 2057 07/10/20 2313 07/11/20 0458 07/11/20 1129 07/11/20 1401 07/12/20 0546 07/13/20 0713  WBC 5.4 38.8* 40.5*  --   --   --   --  26.0*  --   --  14.6* 10.9*  CREATININE 0.80 0.91  0.91  --   --   --   --   --  0.89  --   --  0.86 0.75  LATICACIDVEN  --   --   --    < > 2.6* 2.1* 2.2*  --  2.0* 2.3*  --   --    < > = values in this interval not displayed.     Estimated Creatinine Clearance: 70 mL/min (by C-G formula based on SCr of 0.75 mg/dL).    No Known Allergies  Antimicrobials this admission: vancomycin 6/5 >> 6/8 ceftriaxone 6/5 >> 6/6 Cefepime 6/8 >> 6/9 TMP/SMZ 6/9 >> 6/10 Daptomycin 6/10 >> Pip/tazo 6/10 >>  Dose adjustments this admission:   Microbiology results: 6/5 BCx: NGTD 6/5 UCx: NG  6/5 L ankle: P aeruginosa (pan susc) 6/10 Ucx:NG 6/10 Bcx: NGTD  Thank you for allowing pharmacy to be a part of this patient's care.  8/10, PharmD, BCPS.    Work Cell: [phone] 07/13/2020 11:39 AM

## 2020-07-13 NOTE — Progress Notes (Signed)
PROGRESS NOTE  Ronald Huerta WUJ:[id number] DOB: [birth year]   PCP: Barbette Reichmann, MD  Patient is from: Home  DOA: 07/05/2020 LOS: 8  Chief complaints: Left foot infection  Brief Narrative / Interim history: 86 year old M with PMH of peripheral neuropathy, CAD, hypothyroidism, impaired hearing, RLS, IBS, anxiety and BPH presenting with worsening left foot/ankle infection that has not improved with p.o. Bactrim outpatient.  He has been admitted for left ankle wound infection/cellulitis.  Has been started on vancomycin and cefepime.  MRI showed ulceration with tenosynovitis but no abscess or gas or osseous involvement.  Podiatry consulted and recommended antibiotics and signed off.  Superficial wound culture grew Pseudomonas with moderate GPC's.  Blood cultures NGTD.  Infectious disease consulted  and de-escalated antibiotics to Cipro and Bactrim.  Patient's leukocytosis jumped from 5.4-40.  Procalcitonin 4.44.  Inflammatory markers remains elevated.  UA, urine culture, blood cultures obtained.  Antibiotics escalated to IV Zosyn and daptomycin per ID recommendation.  CT abdomen and pelvis without significant finding.  CT left foot without significant change from his MRI.  Repeat blood and urine cultures NGTD.  Leukocytosis improving.  Clinically improving as well.  ID following.  Subjective: Seen and examined earlier this morning with the help of sign language interpreter with ID number [id number].  No major events overnight of this morning.  He had an episode of diarrhea earlier this morning.  He also reports dry mouth.  He denies pain, shortness of breath, nausea, vomiting or abdominal pain.  Objective: Vitals:   07/13/20 0055 07/13/20 0352 07/13/20 0700 07/13/20 1259  BP: 135/60 (!) 127/57 (!) 146/62 128/61  Pulse: 89 82 88 96  Resp: Temp: 98.5 F (36.9 C) 98.3 F (36.8 C) 98.2 F (36.8 C) 97.8 F (36.6 C)  TempSrc: Oral Oral Oral   SpO2: 93% 91% 92% 97%  Weight:       Height:        Intake/Output Summary (Last 24 hours) at 07/13/2020 1314 Last data filed at 07/13/2020 0935 Gross per 24 hour  Intake 220 ml  Output 925 ml  Net -705 ml   Filed Weights   07/05/20 0540  Weight: 76.2 kg    Examination:  GENERAL: No apparent distress.  Nontoxic. HEENT: MMM.  Vision grossly intact.  Deaf. NECK: Supple.  No apparent JVD.  RESP:  No IWOB.  Fair aeration bilaterally. CVS:  RRR. Heart sounds normal.  ABD/GI/GU: BS+. Abd soft, NTND.  MSK/EXT:  Moves extremities. No apparent deformity.  Left pedal edema SKIN: Ulceration with surrounding erythema over medial aspect of left ankle.  See picture below NEURO: Awake and alert. Oriented appropriately.  No apparent focal neuro deficit. PSYCH: Calm. Normal affect   Procedures:  None  Microbiology summarized: 6/5-COVID-19 and influenza PCR nonreactive. 6/5-urine culture NGTD. 6/5-superficial wound culture with Pseudomonas aeruginosa and GPC 6/5-blood cultures NGTD. 6/10-urine culture NGTD 6/10-blood culture NGTD  Assessment & Plan: Sepsis in the setting of left Ankle wound infection, cellulitis: culture data as above.  CT abdomen and pelvis without significant finding. MRI on 6/5 and CT on 6/10 concerning for cellulitis and tenosynovitis but no abscess or gas.  Sepsis physiology, procalcitonin and clinically improving. -Evaluated by podiatry, Dr. Ether Griffins on admission and surgery was not indicated. -Broad-spectrum antibiotics with daptomycin and Zosyn 6/10>>> -Discontinue IV fluid. -ID following.  Acute metabolic encephalopathy/delirium- could be due to the above.  He was also on Percocet which could have contributed.  There could be some degree of  underlying cognitive impairment.  Improved. -Treat treatable causes -Reorientation and delirium precautions -Scheduled Tylenol for pain.  Avoid opiates or other sedating medications.   BPH with LUTS-indwelling Foley catheter in place.  Failed voiding trial  twice. -Continue Flomax and Proscar. -Started bethanechol on 6/10 but discontinued due to diarrhea and dry mouth. -May need outpatient urology follow-up for voiding trial after discharge  New onset A. Fib: patient had brief A. fib with RVR to 140s but quickly converted to sinus rhythm without intervention.  He was not symptomatic.  Has no history of A. fib in his chart.  TTE in 2019 basically normal.  CHA2DS2-VASc score 2 (for age). -Consider rate control if further A. fib with RVR -Defer anticoagulation given low CHA2DS2-VASc score and low A. fib burden -Optimize electrolytes -Continue telemetry  Hypokalemia/hypophosphatemia -K-Dur 40 mill equivalent x1.  Elevated liver enzymes: Likely due to #1.  Improving. -Continue monitoring  Hypothyroidism:  -Continue with Synthroid   Deafness; -Using sign language interpreter   GERD: Continue with PPI   Abdominal pain/constipation: CT scan is negative but suggests constipation. -MiraLAX, Senokot-S and mag citrate as needed based on severity   Mild hyponatremia: Resolved.   Anemia of chronic disease: Anemia panel basically normal.  Mild drop in Hgb likely dilutional. Recent Labs    07/05/20 0545 07/06/20 0623 07/07/20 0514 07/08/20 0458 07/09/20 0339 07/10/20 0509 07/10/20 0743 07/11/20 0458 07/12/20 0546 07/13/20 0713  HGB 10.6* 10.0* 8.8* 9.1* 8.9* 9.9* 9.8* 8.6* 8.8* 8.8*  -Monitor H&H   Language barrier -Using sign interpreter.  Body mass index is 24.81 kg/m.         DVT prophylaxis:  enoxaparin (LOVENOX) injection 40 mg Start: 07/06/20 1500  Code Status: Full code Family Communication: Patient and Charity fundraiser.  No family member at bedside. Level of care: Med-Surg Status is: Inpatient  Remains inpatient appropriate because:IV treatments appropriate due to intensity of illness or inability to take PO and Inpatient level of care appropriate due to severity of illness   Dispo: The patient is from: Home               Anticipated d/c is to: Home with home health             Patient currently is not medically stable to d/c.   Difficult to place patient No       Consultants:  Podiatry Infectious disease   Sch Meds:  Scheduled Meds:  acetaminophen  1,000 mg Oral Q8H   Chlorhexidine Gluconate Cloth  6 each Topical Daily   enoxaparin (LOVENOX) injection  40 mg Subcutaneous Q24H   ferrous sulfate  325 mg Oral Q breakfast   finasteride  5 mg Oral Daily   folic acid  500 mcg Oral Daily   gabapentin  100 mg Oral TID   levothyroxine  25 mcg Oral Q0600   multivitamin with minerals  1 tablet Oral Daily   omega-3 acid ethyl esters  1 g Oral Daily   pantoprazole  40 mg Oral Daily   polyethylene glycol  17 g Oral BID   senna  1 tablet Oral BID   tamsulosin  0.4 mg Oral Daily   vitamin B-12  1,000 mcg Oral Daily   Continuous Infusions:  sodium chloride 100 mL/hr at 07/11/20 1709   DAPTOmycin (CUBICIN)  IV 450 mg (07/12/20 1946)   piperacillin-tazobactam (ZOSYN)  IV 3.375 g (07/13/20 0555)   PRN Meds:.bisacodyl, magnesium citrate, ondansetron (ZOFRAN) IV, polyvinyl alcohol  Antimicrobials: Anti-infectives (From admission, onward)  Start     Dose/Rate Route Frequency Ordered Stop   07/10/20 1200  DAPTOmycin (CUBICIN) 450 mg in sodium chloride 0.9 % IVPB        6 mg/kg  76.2 kg 118 mL/hr over 30 Minutes Intravenous Daily 07/10/20 1035     07/10/20 1130  piperacillin-tazobactam (ZOSYN) IVPB 3.375 g        3.375 g 12.5 mL/hr over 240 Minutes Intravenous Every 8 hours 07/10/20 1035     07/10/20 0800  ciprofloxacin (CIPRO) tablet 500 mg  Status:  Discontinued        500 mg Oral 2 times daily 07/09/20 1646 07/10/20 0937   07/09/20 0800  sulfamethoxazole-trimethoprim (BACTRIM DS) 800-160 MG per tablet 1 tablet  Status:  Discontinued        1 tablet Oral Every 12 hours 07/08/20 1604 07/10/20 0937   07/08/20 2320  vancomycin (VANCOREADY) IVPB 750 mg/150 mL  Status:  Discontinued       See Hyperspace  for full Linked Orders Report.   750 mg 150 mL/hr over 60 Minutes Intravenous Every 12 hours 07/08/20 1600 07/08/20 1604   07/08/20 0900  ceFEPIme (MAXIPIME) 2 g in sodium chloride 0.9 % 100 mL IVPB        2 g 200 mL/hr over 30 Minutes Intravenous Every 8 hours 07/08/20 0831 07/10/20 0722   07/06/20 1700  ceFEPIme (MAXIPIME) 2 g in sodium chloride 0.9 % 100 mL IVPB  Status:  Discontinued        2 g 200 mL/hr over 30 Minutes Intravenous Every 12 hours 07/06/20 1526 07/08/20 0831   07/06/20 0800  vancomycin (VANCOREADY) IVPB 1250 mg/250 mL  Status:  Discontinued       See Hyperspace for full Linked Orders Report.   1,250 mg 166.7 mL/hr over 90 Minutes Intravenous Every 24 hours 07/05/20 0756 07/08/20 1600   07/06/20 0600  cefTRIAXone (ROCEPHIN) 2 g in sodium chloride 0.9 % 100 mL IVPB  Status:  Discontinued        2 g 200 mL/hr over 30 Minutes Intravenous Every 24 hours 07/05/20 0751 07/06/20 1518   07/05/20 0800  vancomycin (VANCOREADY) IVPB 500 mg/100 mL       See Hyperspace for full Linked Orders Report.   500 mg 100 mL/hr over 60 Minutes Intravenous  Once 07/05/20 0756 07/05/20 1219   07/05/20 0630  cefTRIAXone (ROCEPHIN) 2 g in sodium chloride 0.9 % 100 mL IVPB        2 g 200 mL/hr over 30 Minutes Intravenous  Once 07/05/20 0629 07/05/20 0709   07/05/20 0630  vancomycin (VANCOCIN) IVPB 1000 mg/200 mL premix        1,000 mg 200 mL/hr over 60 Minutes Intravenous  Once 07/05/20 0629 07/05/20 0812        I have personally reviewed the following labs and images: CBC: Recent Labs  Lab 07/10/20 0509 07/10/20 0743 07/11/20 0458 07/12/20 0546 07/13/20 0713  WBC 38.8* 40.5* 26.0* 14.6* 10.9*  NEUTROABS  --  38.7*  --   --   --   HGB 9.9* 9.8* 8.6* 8.8* 8.8*  HCT 28.6* 28.5* 24.8* 25.4* 25.1*  MCV 96.9 97.9 96.1 96.6 95.1  PLT 220 225 195 225 280   BMP &GFR Recent Labs  Lab 07/09/20 0339 07/10/20 0509 07/11/20 0458 07/12/20 0546 07/13/20 0713  NA 140 138 135 135 135  K  4.0 3.9 3.7 3.6 3.7  CL 109 103 105 105 106  CO2 27 25 26  26  24  GLUCOSE 91 112* 101* 101* 98  BUN CREATININE 0.80 0.91  0.91 0.89 0.86 0.75  CALCIUM 8.3* 8.1* 7.9* 7.8* 7.6*  MG 1.8 2.0 2.0 1.9 1.8  PHOS 2.6 2.9 1.7* 1.9* 2.9   Estimated Creatinine Clearance: 70 mL/min (by C-G formula based on SCr of 0.75 mg/dL). Liver & Pancreas: Recent Labs  Lab 07/09/20 0339 07/10/20 0509 07/11/20 0458 07/12/20 0546 07/13/20 0713  AST  --   --  69* 52* 43*  ALT  --   --  71* 59* 52*  ALKPHOS  --   --  121 152* 195*  BILITOT  --   --  1.3* 1.1 1.0  PROT  --   --  5.4* 5.3* 5.6*  ALBUMIN 2.8* 2.9* 2.5* 2.3* 2.3*   No results for input(s): LIPASE, AMYLASE in the last 168 hours.  Recent Labs  Lab 07/11/20 0458 07/12/20 0546  AMMONIA 31 10   Diabetic: No results for input(s): HGBA1C in the last 72 hours. Recent Labs  Lab 07/09/20 2053  GLUCAP 97   Cardiac Enzymes: Recent Labs  Lab 07/10/20 1054  CKTOTAL 202   No results for input(s): PROBNP in the last 8760 hours. Coagulation Profile: No results for input(s): INR, PROTIME in the last 168 hours.  Thyroid Function Tests: No results for input(s): TSH, T4TOTAL, FREET4, T3FREE, THYROIDAB in the last 72 hours.  Lipid Profile: No results for input(s): CHOL, HDL, LDLCALC, TRIG, CHOLHDL, LDLDIRECT in the last 72 hours. Anemia Panel: No results for input(s): VITAMINB12, FOLATE, FERRITIN, TIBC, IRON, RETICCTPCT in the last 72 hours.  Urine analysis:    Component Value Date/Time   COLORURINE AMBER (A) 07/10/2020 0901   APPEARANCEUR HAZY (A) 07/10/2020 0901   APPEARANCEUR Clear 12/14/2013 2131   LABSPEC 1.027 07/10/2020 0901   LABSPEC 1.025 12/14/2013 2131   PHURINE 5.0 07/10/2020 0901   GLUCOSEU NEGATIVE 07/10/2020 0901   GLUCOSEU Negative 12/14/2013 2131   HGBUR MODERATE (A) 07/10/2020 0901   BILIRUBINUR NEGATIVE 07/10/2020 0901   BILIRUBINUR Negative 12/14/2013 2131   KETONESUR 5 (A) 07/10/2020 0901    PROTEINUR 30 (A) 07/10/2020 0901   NITRITE NEGATIVE 07/10/2020 0901   LEUKOCYTESUR LARGE (A) 07/10/2020 0901   LEUKOCYTESUR Negative 12/14/2013 2131   Sepsis Labs: Invalid input(s): PROCALCITONIN, LACTICIDVEN  Microbiology: Recent Results (from the past 240 hour(s))  Culture, blood (routine x 2)     Status: None   Collection Time: 07/05/20  5:45 AM   Specimen: BLOOD  Result Value Ref Range Status   Specimen Description BLOOD RHAND  Final   Special Requests   Final    BOTTLES DRAWN AEROBIC AND ANAEROBIC Blood Culture adequate volume   Culture   Final    NO GROWTH 6 DAYS Performed at [facility], [address], [place], [place] [address]    Report Status 07/11/2020 FINAL  Final  Culture, blood (routine x 2)     Status: None   Collection Time: 07/05/20  5:45 AM   Specimen: BLOOD  Result Value Ref Range Status   Specimen Description BLOOD  LEFT HAND  Final   Special Requests   Final    BOTTLES DRAWN AEROBIC AND ANAEROBIC Blood Culture adequate volume   Culture   Final    NO GROWTH 6 DAYS Performed at [facility], [address], [place], [place] [address]    Report Status 07/11/2020 FINAL  Final  Urine culture     Status:  None   Collection Time: 07/05/20  5:45 AM   Specimen: Urine, Random  Result Value Ref Range Status   Specimen Description   Final    URINE, RANDOM Performed at [facility], [address] [place], [place] [address]    Special Requests   Final    NONE Performed at [facility], [address], [place], [place] [address]    Culture   Final    NO GROWTH Performed at [facility], [address], [place], [place] [address]    Report Status 07/06/2020 FINAL  Final  Resp Panel by RT-PCR (Flu A&B, Covid) Nasopharyngeal Swab     Status: None   Collection Time: 07/05/20  5:45 AM   Specimen: Nasopharyngeal Swab; Nasopharyngeal(NP) swabs in vial transport medium  Result Value Ref Range Status   SARS  Coronavirus 2 by RT PCR NEGATIVE NEGATIVE Final    Comment: (NOTE) SARS-CoV-2 target nucleic acids are NOT DETECTED.  The SARS-CoV-2 RNA is generally detectable in upper respiratory specimens during the acute phase of infection. The lowest concentration of SARS-CoV-2 viral copies this assay can detect is 138 copies/mL. A negative result does not preclude SARS-Cov-2 infection and should not be used as the sole basis for treatment or other patient management decisions. A negative result may occur with  improper specimen collection/handling, submission of specimen other than nasopharyngeal swab, presence of viral mutation(s) within the areas targeted by this assay, and inadequate number of viral copies(<138 copies/mL). A negative result must be combined with clinical observations, patient history, and epidemiological information. The expected result is Negative.  Fact Sheet for Patients:  BloggerCourse.com  Fact Sheet for Healthcare Providers:  SeriousBroker.it  This test is no t yet approved or cleared by the [place] FDA and  has been authorized for detection and/or diagnosis of SARS-CoV-2 by FDA under an Emergency Use Authorization (EUA). This EUA will remain  in effect (meaning this test can be used) for the duration of the COVID-19 declaration under Section 564(b)(1) of the Act, 21 U.S.C.section 360bbb-3(b)(1), unless the authorization is terminated  or revoked sooner.       Influenza A by PCR NEGATIVE NEGATIVE Final   Influenza B by PCR NEGATIVE NEGATIVE Final    Comment: (NOTE) The Xpert Xpress SARS-CoV-2/FLU/RSV plus assay is intended as an aid in the diagnosis of influenza from Nasopharyngeal swab specimens and should not be used as a sole basis for treatment. Nasal washings and aspirates are unacceptable for Xpert Xpress SARS-CoV-2/FLU/RSV testing.  Fact Sheet for  Patients: BloggerCourse.com  Fact Sheet for Healthcare Providers: SeriousBroker.it  This test is not yet approved or cleared by the [place] FDA and has been authorized for detection and/or diagnosis of SARS-CoV-2 by FDA under an Emergency Use Authorization (EUA). This EUA will remain in effect (meaning this test can be used) for the duration of the COVID-19 declaration under Section 564(b)(1) of the Act, 21 U.S.C. section 360bbb-3(b)(1), unless the authorization is terminated or revoked.  Performed at [facility], [address] [place], [place] [address]   Aerobic Culture w Gram Stain (superficial specimen)     Status: None   Collection Time: 07/05/20  5:45 AM   Specimen: Ankle  Result Value Ref Range Status   Specimen Description ANKLE  Final   Special Requests LEFT  Final   Gram Stain   Final    NO WBC SEEN MODERATE GRAM POSITIVE COCCI MODERATE GRAM VARIABLE ROD Performed at [facility], [address], [place], [place] [address]    Culture FEW PSEUDOMONAS AERUGINOSA  Final   Report Status 07/07/2020 FINAL  Final   Organism ID, Bacteria PSEUDOMONAS AERUGINOSA  Final      Susceptibility   Pseudomonas aeruginosa - MIC*    CEFTAZIDIME <=1 SENSITIVE Sensitive     CIPROFLOXACIN <=0.25 SENSITIVE Sensitive     GENTAMICIN <=1 SENSITIVE Sensitive     IMIPENEM 1 SENSITIVE Sensitive     PIP/TAZO <=4 SENSITIVE Sensitive     CEFEPIME 2 SENSITIVE Sensitive     * FEW PSEUDOMONAS AERUGINOSA  Urine Culture     Status: None   Collection Time: 07/10/20  9:01 AM   Specimen: Urine, Random  Result Value Ref Range Status   Specimen Description   Final    URINE, RANDOM Performed at [facility], [address], [place], [place] [address]    Special Requests   Final    NONE Performed at [facility], [address], [place], [place] [address]    Culture   Final    NO GROWTH Performed at  [facility], [address] [place], [place] [address]    Report Status 07/11/2020 FINAL  Final  CULTURE, BLOOD (ROUTINE X 2) w Reflex to ID Panel     Status: None (Preliminary result)   Collection Time: 07/10/20 10:52 AM   Specimen: BLOOD LEFT HAND  Result Value Ref Range Status   Specimen Description BLOOD LEFT HAND  Final   Special Requests   Final    BOTTLES DRAWN AEROBIC AND ANAEROBIC Blood Culture adequate volume   Culture   Final    NO GROWTH 3 DAYS Performed at [facility], [address], [place], [place] [address]    Report Status PENDING  Incomplete  CULTURE, BLOOD (ROUTINE X 2) w Reflex to ID Panel     Status: None (Preliminary result)   Collection Time: 07/10/20 10:54 AM   Specimen: Left Antecubital; Blood  Result Value Ref Range Status   Specimen Description LEFT ANTECUBITAL  Final   Special Requests   Final    BOTTLES DRAWN AEROBIC AND ANAEROBIC Blood Culture adequate volume   Culture   Final    NO GROWTH 3 DAYS Performed at [facility], [address] [place], [place] [address]    Report Status PENDING  Incomplete    Radiology Studies: No results found.    Cypress Hinkson T. Jhony [facility]  If 7PM-7AM, please contact night-coverage [email or web address] 07/13/2020, 1:14 PM

## 2020-07-13 NOTE — TOC Progression Note (Signed)
Transition of Care [facility]) - Progression Note    Patient Details  Name: Ronald Huerta MRN: [id number] Date of Birth: 1934-03-20  Transition of Care [facility]) CM/SW Contact  Allayne Butcher, RN Phone Number: 07/13/2020, 2:51 PM  Clinical Narrative:    Per MD patient needs another day or two before medically ready for discharge.  Plan is for home with home health services through [facility].    Expected Discharge Plan: Home w Home Health Services Barriers to Discharge: Continued Medical Work up  Expected Discharge Plan and Services Expected Discharge Plan: Home w Home Health Services   Discharge Planning Services: CM Consult Post Acute Care Choice: Home Health Living arrangements for the past 2 months: Single Family Home                 DME Arranged: N/A         HH Arranged: PT, OT HH Agency: [facility] Date HH Agency Contacted: 07/07/20 Time HH Agency Contacted: 1230 Representative spoke with at [facility] Agency: Elnita Maxwell   Social Determinants of Health (SDOH) Interventions    Readmission Risk Interventions No flowsheet data found.

## 2020-07-13 NOTE — Progress Notes (Signed)
Date of Admission:  07/05/2020     ID: Ronald Huerta is a 86 y.o. male Principal Problem:   Wound infection-left leg Active Problems:   BPH with obstruction/lower urinary tract symptoms   Cellulitis of leg, left   Acquired hypothyroidism   Deaf, nonspeaking   GERD (gastroesophageal reflux disease)   Abdominal pain Spoke to patient through ASL interpreter service  Subjective: Patient is doing better Sitting in the bed and having breakfast No fever Body ache is much better   Medications:   acetaminophen  1,000 mg Oral Q8H   bethanechol  25 mg Oral TID   Chlorhexidine Gluconate Cloth  6 each Topical Daily   enoxaparin (LOVENOX) injection  40 mg Subcutaneous Q24H   ferrous sulfate  325 mg Oral Q breakfast   finasteride  5 mg Oral Daily   folic acid  500 mcg Oral Daily   gabapentin  100 mg Oral TID   levothyroxine  25 mcg Oral Q0600   multivitamin with minerals  1 tablet Oral Daily   omega-3 acid ethyl esters  1 g Oral Daily   pantoprazole  40 mg Oral Daily   polyethylene glycol  17 g Oral BID   senna  1 tablet Oral BID   tamsulosin  0.4 mg Oral Daily   vitamin B-12  1,000 mcg Oral Daily    Objective: Vital signs in last 24 hours: Patient Vitals for the past 24 hrs:  BP Temp Temp src Pulse Resp SpO2  07/13/20 0700 (!) 146/62 98.2 F (36.8 C) Oral 88 18 92 %  07/13/20 0352 (!) 127/57 98.3 F (36.8 C) Oral 82 20 91 %  07/13/20 0055 135/60 98.5 F (36.9 C) Oral 89 18 93 %  07/12/20 2024 128/62 98.2 F (36.8 C) Oral 92 20 95 %  07/12/20 1614 (!) 125/59 98.9 F (37.2 C) Oral (!) 103 18 90 %  07/12/20 1111 (!) 125/53 98.5 F (36.9 C) Oral 89 16 91 %     PHYSICAL EXAM:  General: Alert, cooperative, no distress, appears stated age.  Head: Normocephalic, without obvious abnormality, atraumatic. Eyes: Conjunctivae clear, anicteric sclerae. Pupils are equal ENT Nares normal. No drainage or sinus tenderness. Lips, mucosa, and tongue normal. No Thrush Neck:  Supple, symmetrical, no adenopathy, thyroid: non tender no carotid bruit and no JVD. Back: No CVA tenderness. Lungs: Clear to auscultation bilaterally. No Wheezing or Rhonchi. No rales. Heart: Regular rate and rhythm, no murmur, rub or gallop. Abdomen: Soft, non-tender,not distended. Bowel sounds normal. No masses Extremities: atraumatic, no cyanosis. No edema. No clubbing Skin: No rashes or lesions. Or bruising Lymph: Cervical, supraclavicular normal. Neurologic: Grossly non-focal  Lab Results Recent Labs    07/12/20 0546 07/13/20 0713  WBC 14.6* 10.9*  HGB 8.8* 8.8*  HCT 25.4* 25.1*  NA 135 135  K 3.6 3.7  CL 105 106  CO2 26 24  BUN 20 12  CREATININE 0.86 0.75   Liver Panel Recent Labs    07/12/20 0546 07/13/20 0713  PROT 5.3* 5.6*  ALBUMIN 2.3* 2.3*  AST 52* 43*  ALT 59* 52*  ALKPHOS 152* 195*  BILITOT 1.1 1.0  Microbiology: 07/10/2020 blood culture no growth 07/10/2020 urine culture no growth 07/05/2020 left leg wound culture Pseudomonas aeruginosa  Studies/Results: CT abdomen and pelvis.  Kidneys no evidence of hydronephrosis or pyelonephritis  Left leg CT shows superficial wound.  No abscess.   Assessment/Plan:  You have leukocytosis and body aches have resolved Repeat urine culture and  blood culture were negative Patient has been on Zosyn and daptomycin since 07/10/2020. Can DC both and switch to ciprofloxacin and augmentin tomorrow.  Venous ulcer with cellulitis left leg.  Pseudomonas in recent culture.  Staph aureus and Enterococcus in previous culture From January 2022.  Urinary retention has a Foley catheter.  Delirium has resolved  Discussed the management with the hospitalist.

## 2020-07-14 LAB — CBC
HCT: 24.3 % — ABNORMAL LOW (ref 39.0–52.0)
Hemoglobin: 8.3 g/dL — ABNORMAL LOW (ref 13.0–17.0)
MCH: 33.5 pg (ref 26.0–34.0)
MCHC: 34.2 g/dL (ref 30.0–36.0)
MCV: 98 fL (ref 80.0–100.0)
Platelets: 295 10*3/uL (ref 150–400)
RBC: 2.48 MIL/uL — ABNORMAL LOW (ref 4.22–5.81)
RDW: 16.3 % — ABNORMAL HIGH (ref 11.5–15.5)
WBC: 9.8 10*3/uL (ref 4.0–10.5)
nRBC: 0.4 % — ABNORMAL HIGH (ref 0.0–0.2)

## 2020-07-14 LAB — COMPREHENSIVE METABOLIC PANEL
ALT: 55 U/L — ABNORMAL HIGH (ref 0–44)
AST: 59 U/L — ABNORMAL HIGH (ref 15–41)
Albumin: 2.3 g/dL — ABNORMAL LOW (ref 3.5–5.0)
Alkaline Phosphatase: 215 U/L — ABNORMAL HIGH (ref 38–126)
Anion gap: 6 (ref 5–15)
BUN: 9 mg/dL (ref 8–23)
CO2: 26 mmol/L (ref 22–32)
Calcium: 7.8 mg/dL — ABNORMAL LOW (ref 8.9–10.3)
Chloride: 106 mmol/L (ref 98–111)
Creatinine, Ser: 0.87 mg/dL (ref 0.61–1.24)
GFR, Estimated: >60 mL/min (ref >60)
Glucose, Bld: 111 mg/dL — ABNORMAL HIGH (ref 70–99)
Potassium: 3.6 mmol/L (ref 3.5–5.1)
Sodium: 138 mmol/L (ref 135–145)
Total Bilirubin: 0.8 mg/dL (ref 0.3–1.2)
Total Protein: 5.4 g/dL — ABNORMAL LOW (ref 6.5–8.1)

## 2020-07-14 LAB — PHOSPHORUS: Phosphorus: 2.5 mg/dL (ref 2.5–4.6)

## 2020-07-14 LAB — MAGNESIUM: Magnesium: 1.9 mg/dL (ref 1.7–2.4)

## 2020-07-14 MED ORDER — POTASSIUM CHLORIDE CRYS ER 20 MEQ PO TBCR
40.0000 meq | EXTENDED_RELEASE_TABLET | Freq: Once | ORAL | Status: AC
  Administered 2020-07-14: 40 meq via ORAL
  Filled 2020-07-14: qty 2

## 2020-07-14 MED ORDER — FERROUS SULFATE 325 (65 FE) MG PO TABS
325.0000 mg | ORAL_TABLET | Freq: Every day | ORAL | Status: DC
  Administered 2020-07-15: 11:00:00 325 mg via ORAL
  Filled 2020-07-14: qty 1

## 2020-07-14 NOTE — Progress Notes (Signed)
PROGRESS NOTE  Ronald Huerta [place] OZD:[id number] DOB: 1934-09-12   PCP: Barbette Reichmann, MD  Patient is from: Home  DOA: 07/05/2020 LOS: 9  Chief complaints: Left foot infection  Brief Narrative / Interim history: 86 year old M with PMH of peripheral neuropathy, CAD, hypothyroidism, impaired hearing, RLS, IBS, anxiety and BPH presenting with worsening left foot/ankle infection that has not improved with p.o. Bactrim outpatient.  He has been admitted for left ankle wound infection/cellulitis.  Has been started on vancomycin and cefepime.  MRI showed ulceration with tenosynovitis but no abscess or gas or osseous involvement.  Podiatry consulted and recommended antibiotics and signed off.  Superficial wound culture grew Pseudomonas with moderate GPC's.  Blood cultures NGTD.  Infectious disease consulted  and de-escalated antibiotics to Cipro and Bactrim.  Patient's leukocytosis jumped from 5.4-40.  Procalcitonin 4.44.  Inflammatory markers remains elevated.  UA, urine culture, blood cultures obtained.  Antibiotics escalated to IV Zosyn and daptomycin per ID recommendation.  CT abdomen and pelvis without significant finding.  CT left foot without significant change from his MRI.  Repeat blood and urine cultures NGTD.  Leukocytosis resolved.  Clinically improving as well.  ID de-escalated antibiotics to p.o. Augmentin and Cipro.  ID to decide duration of treatment.  Patient has acute urinary retention and failed voiding trial x2.  Urology consulted.    Subjective: Seen and examined earlier this morning with the help of sign language interpreter with ID number [id number].  No major events overnight of this morning.  No complaints.  Just hungry and wants to eat his breakfast.  He denies pain.   Objective: Vitals:   07/14/20 0011 07/14/20 0607 07/14/20 0828 07/14/20 1231  BP: (!) 150/72 (!) 131/55 (!) 140/58 (!) 156/60  Pulse: 88 88 87 93  Resp: 20 18 17 16   Temp: 97.6 F (36.4 C) 98 F (36.7  C) 98.3 F (36.8 C) 98 F (36.7 C)  TempSrc:  Oral    SpO2: 94% 90% 95% 92%  Weight:      Height:        Intake/Output Summary (Last 24 hours) at 07/14/2020 1435 Last data filed at 07/14/2020 1101 Gross per 24 hour  Intake 537 ml  Output 2200 ml  Net -1663 ml   Filed Weights   07/05/20 0540  Weight: 76.2 kg    Examination:  GENERAL: No apparent distress.  Nontoxic. HEENT: MMM.  Vision grossly intact.  Deaf and mute. NECK: Supple.  No apparent JVD.  RESP:  No IWOB.  Fair aeration bilaterally. CVS:  RRR. Heart sounds normal.  ABD/GI/GU: BS+. Abd soft, NTND.  MSK/EXT:  Moves extremities. No apparent deformity.  Ace wrap over LLE. SKIN: LLE and left ankle in Ace wrap today. NEURO: Awake and alert.  Oriented to self and place.  No apparent focal neuro deficit. PSYCH: Calm. Normal affect.    Procedures:  None  Microbiology summarized: 6/5-COVID-19 and influenza PCR nonreactive. 6/5-urine culture NGTD. 6/5-superficial wound culture with Pseudomonas aeruginosa and GPC 6/5-blood cultures NGTD. 6/10-urine culture NGTD 6/10-blood culture NGTD  Assessment & Plan: Sepsis in the setting of left Ankle wound infection, cellulitis: culture data as above.  CT abdomen and pelvis without significant finding. MRI on 6/5 and CT on 6/10 concerning for cellulitis and tenosynovitis but no abscess or gas.  Sepsis physiology resolving. -Evaluated by podiatry, Dr. 8/10 on admission and surgery was not indicated. -BSA with daptomycin and Zosyn 6/10-6/13>> Augmentin and Cipro 6/14>>> -Defer duration of antibiotic treatment to ID. -ID following.  Acute metabolic encephalopathy/delirium- could be due to the above and opiates.  He could have some underlying cognitive impairment.  Encephalopathy seems to have resolved. -Treat treatable causes -Reorientation and delirium precautions -Scheduled Tylenol for pain.  Avoid opiates or other sedating medications.   BPH with LUTS-indwelling Foley  catheter in place.  Failed voiding trial twice. -Continue Flomax and Proscar. -Started bethanechol on 6/10 but discontinued due to diarrhea and dry mouth. -Urology consulted.  Probably voiding trial outpatient  New onset A. Fib: patient had brief A. fib with RVR to 140s but quickly converted to sinus rhythm without intervention.  He was not symptomatic.  Has no history of A. fib in his chart.  TTE in 2019 basically normal.  CHA2DS2-VASc score 2 (for age). -Consider rate control if further A. fib with RVR -Defer anticoagulation given low CHA2DS2-VASc score and low A. fib burden -Optimize electrolytes -Continue telemetry  Hypokalemia/hypophosphatemia -K-Dur 40 mill equivalent x1.  Elevated liver enzymes: Likely due to #1.  Improving. -Continue monitoring  Hypothyroidism:  -Continue with Synthroid   Deafness; -Using sign language interpreter   GERD: Continue with PPI   Abdominal pain/constipation: CT scan is negative but suggests constipation. -MiraLAX, Senokot-S and mag citrate as needed based on severity   Mild hyponatremia: Resolved.   Anemia of chronic disease: Anemia panel basically normal.  Mild drop in Hgb likely dilutional. Recent Labs    07/06/20 0623 07/07/20 0514 07/08/20 0458 07/09/20 0339 07/10/20 0509 07/10/20 0743 07/11/20 0458 07/12/20 0546 07/13/20 0713 07/14/20 0549  HGB 10.0* 8.8* 9.1* 8.9* 9.9* 9.8* 8.6* 8.8* 8.8* 8.3*  -Monitor H&H   Language barrier -Using sign interpreter.  Body mass index is 24.81 kg/m.         DVT prophylaxis:  enoxaparin (LOVENOX) injection 40 mg Start: 07/06/20 1500  Code Status: Full code Family Communication: Patient and Charity fundraiser.  No family member at bedside. Level of care: Med-Surg Status is: Inpatient  Remains inpatient appropriate because:Inpatient level of care appropriate due to severity of illness   Dispo: The patient is from: Home              Anticipated d/c is to: Home with HH in the next 24 to 48 hours  once stable on PO antibiotics           Patient currently is not medically stable to d/c.   Difficult to place patient No       Consultants:  Podiatry Infectious disease   Sch Meds:  Scheduled Meds:  acetaminophen  1,000 mg Oral Q8H   amoxicillin-clavulanate  1 tablet Oral Q12H   Chlorhexidine Gluconate Cloth  6 each Topical Daily   ciprofloxacin  500 mg Oral BID   enoxaparin (LOVENOX) injection  40 mg Subcutaneous Q24H   [START ON 07/15/2020] ferrous sulfate  325 mg Oral Q lunch   finasteride  5 mg Oral Daily   folic acid  500 mcg Oral Daily   gabapentin  100 mg Oral TID   levothyroxine  25 mcg Oral Q0600   multivitamin with minerals  1 tablet Oral Daily   omega-3 acid ethyl esters  1 g Oral Daily   pantoprazole  40 mg Oral Daily   polyethylene glycol  17 g Oral BID   senna  1 tablet Oral BID   tamsulosin  0.4 mg Oral Daily   vitamin B-12  1,000 mcg Oral Daily   Continuous Infusions:   PRN Meds:.bisacodyl, magnesium citrate, ondansetron (ZOFRAN) IV, polyvinyl alcohol  Antimicrobials: Anti-infectives (From  admission, onward)    Start     Dose/Rate Route Frequency Ordered Stop   07/14/20 2200  sulfamethoxazole-trimethoprim (BACTRIM DS) 800-160 MG per tablet 1 tablet  Status:  Discontinued        1 tablet Oral Every 12 hours 07/13/20 1653 07/13/20 1926   07/14/20 1000  amoxicillin-clavulanate (AUGMENTIN) 875-125 MG per tablet 1 tablet        1 tablet Oral Every 12 hours 07/13/20 1926     07/14/20 0800  ciprofloxacin (CIPRO) tablet 500 mg        500 mg Oral 2 times daily 07/13/20 1653     07/10/20 1200  DAPTOmycin (CUBICIN) 450 mg in sodium chloride 0.9 % IVPB        6 mg/kg  76.2 kg 118 mL/hr over 30 Minutes Intravenous Daily 07/10/20 1035 07/13/20 2201   07/10/20 1130  piperacillin-tazobactam (ZOSYN) IVPB 3.375 g        3.375 g 12.5 mL/hr over 240 Minutes Intravenous Every 8 hours 07/10/20 1035 07/14/20 0545   07/10/20 0800  ciprofloxacin (CIPRO) tablet 500 mg   Status:  Discontinued        500 mg Oral 2 times daily 07/09/20 1646 07/10/20 0937   07/09/20 0800  sulfamethoxazole-trimethoprim (BACTRIM DS) 800-160 MG per tablet 1 tablet  Status:  Discontinued        1 tablet Oral Every 12 hours 07/08/20 1604 07/10/20 0937   07/08/20 2320  vancomycin (VANCOREADY) IVPB 750 mg/150 mL  Status:  Discontinued       See Hyperspace for full Linked Orders Report.   750 mg 150 mL/hr over 60 Minutes Intravenous Every 12 hours 07/08/20 1600 07/08/20 1604   07/08/20 0900  ceFEPIme (MAXIPIME) 2 g in sodium chloride 0.9 % 100 mL IVPB        2 g 200 mL/hr over 30 Minutes Intravenous Every 8 hours 07/08/20 0831 07/10/20 0722   07/06/20 1700  ceFEPIme (MAXIPIME) 2 g in sodium chloride 0.9 % 100 mL IVPB  Status:  Discontinued        2 g 200 mL/hr over 30 Minutes Intravenous Every 12 hours 07/06/20 1526 07/08/20 0831   07/06/20 0800  vancomycin (VANCOREADY) IVPB 1250 mg/250 mL  Status:  Discontinued       See Hyperspace for full Linked Orders Report.   1,250 mg 166.7 mL/hr over 90 Minutes Intravenous Every 24 hours 07/05/20 0756 07/08/20 1600   07/06/20 0600  cefTRIAXone (ROCEPHIN) 2 g in sodium chloride 0.9 % 100 mL IVPB  Status:  Discontinued        2 g 200 mL/hr over 30 Minutes Intravenous Every 24 hours 07/05/20 0751 07/06/20 1518   07/05/20 0800  vancomycin (VANCOREADY) IVPB 500 mg/100 mL       See Hyperspace for full Linked Orders Report.   500 mg 100 mL/hr over 60 Minutes Intravenous  Once 07/05/20 0756 07/05/20 1219   07/05/20 0630  cefTRIAXone (ROCEPHIN) 2 g in sodium chloride 0.9 % 100 mL IVPB        2 g 200 mL/hr over 30 Minutes Intravenous  Once 07/05/20 0629 07/05/20 0709   07/05/20 0630  vancomycin (VANCOCIN) IVPB 1000 mg/200 mL premix        1,000 mg 200 mL/hr over 60 Minutes Intravenous  Once 07/05/20 0629 07/05/20 9604        I have personally reviewed the following labs and images: CBC: Recent Labs  Lab 07/10/20 0743 07/11/20 0458  07/12/20 0546 07/13/20 5409  07/14/20 0549  WBC 40.5* 26.0* 14.6* 10.9* 9.8  NEUTROABS 38.7*  --   --   --   --   HGB 9.8* 8.6* 8.8* 8.8* 8.3*  HCT 28.5* 24.8* 25.4* 25.1* 24.3*  MCV 97.9 96.1 96.6 95.1 98.0  PLT 225 195 225 280 295   BMP &GFR Recent Labs  Lab 07/10/20 0509 07/11/20 0458 07/12/20 0546 07/13/20 0713 07/14/20 0549  NA 138 135 135 135 138  K 3.9 3.7 3.6 3.7 3.6  CL 103 105 105 106 106  CO2 25 26 26 24 26   GLUCOSE 112* 101* 101* 98 111*  BUN 13 22 20 12 9   CREATININE 0.91  0.91 0.89 0.86 0.75 0.87  CALCIUM 8.1* 7.9* 7.8* 7.6* 7.8*  MG 2.0 2.0 1.9 1.8 1.9  PHOS 2.9 1.7* 1.9* 2.9 2.5   Estimated Creatinine Clearance: 64.3 mL/min (by C-G formula based on SCr of 0.87 mg/dL). Liver & Pancreas: Recent Labs  Lab 07/10/20 0509 07/11/20 0458 07/12/20 0546 07/13/20 0713 07/14/20 0549  AST  --  69* 52* 43* 59*  ALT  --  71* 59* 52* 55*  ALKPHOS  --  121 152* 195* 215*  BILITOT  --  1.3* 1.1 1.0 0.8  PROT  --  5.4* 5.3* 5.6* 5.4*  ALBUMIN 2.9* 2.5* 2.3* 2.3* 2.3*   No results for input(s): LIPASE, AMYLASE in the last 168 hours.  Recent Labs  Lab 07/11/20 0458 07/12/20 0546  AMMONIA 31 10   Diabetic: No results for input(s): HGBA1C in the last 72 hours. Recent Labs  Lab 07/09/20 2053  GLUCAP 97   Cardiac Enzymes: Recent Labs  Lab 07/10/20 1054  CKTOTAL 202   No results for input(s): PROBNP in the last 8760 hours. Coagulation Profile: No results for input(s): INR, PROTIME in the last 168 hours.  Thyroid Function Tests: No results for input(s): TSH, T4TOTAL, FREET4, T3FREE, THYROIDAB in the last 72 hours.  Lipid Profile: No results for input(s): CHOL, HDL, LDLCALC, TRIG, CHOLHDL, LDLDIRECT in the last 72 hours. Anemia Panel: No results for input(s): VITAMINB12, FOLATE, FERRITIN, TIBC, IRON, RETICCTPCT in the last 72 hours.  Urine analysis:    Component Value Date/Time   COLORURINE AMBER (A) 07/10/2020 0901   APPEARANCEUR HAZY (A)  07/10/2020 0901   APPEARANCEUR Clear 12/14/2013 2131   LABSPEC 1.027 07/10/2020 0901   LABSPEC 1.025 12/14/2013 2131   PHURINE 5.0 07/10/2020 0901   GLUCOSEU NEGATIVE 07/10/2020 0901   GLUCOSEU Negative 12/14/2013 2131   HGBUR MODERATE (A) 07/10/2020 0901   BILIRUBINUR NEGATIVE 07/10/2020 0901   BILIRUBINUR Negative 12/14/2013 2131   KETONESUR 5 (A) 07/10/2020 0901   PROTEINUR 30 (A) 07/10/2020 0901   NITRITE NEGATIVE 07/10/2020 0901   LEUKOCYTESUR LARGE (A) 07/10/2020 0901   LEUKOCYTESUR Negative 12/14/2013 2131   Sepsis Labs: Invalid input(s): PROCALCITONIN, LACTICIDVEN  Microbiology: Recent Results (from the past 240 hour(s))  Culture, blood (routine x 2)     Status: None   Collection Time: 07/05/20  5:45 AM   Specimen: BLOOD  Result Value Ref Range Status   Specimen Description BLOOD RHAND  Final   Special Requests   Final    BOTTLES DRAWN AEROBIC AND ANAEROBIC Blood Culture adequate volume   Culture   Final    NO GROWTH 6 DAYS Performed at [facility], [address] [place], [address] [place]    Report Status 07/11/2020 FINAL  Final  Culture, blood (routine x 2)     Status: None  Collection Time: 07/05/20  5:45 AM   Specimen: BLOOD  Result Value Ref Range Status   Specimen Description BLOOD  LEFT HAND  Final   Special Requests   Final    BOTTLES DRAWN AEROBIC AND ANAEROBIC Blood Culture adequate volume   Culture   Final    NO GROWTH 6 DAYS Performed at [facility], [address], [place], [place] [address]    Report Status 07/11/2020 FINAL  Final  Urine culture     Status: None   Collection Time: 07/05/20  5:45 AM   Specimen: Urine, Random  Result Value Ref Range Status   Specimen Description   Final    URINE, RANDOM Performed at [facility], [address] [place], [place] [address]    Special Requests   Final    NONE Performed at [facility], [address] [place], [place] [address]    Culture   Final     NO GROWTH Performed at [facility], [address] [place], [place] [address]    Report Status 07/06/2020 FINAL  Final  Resp Panel by RT-PCR (Flu A&B, Covid) Nasopharyngeal Swab     Status: None   Collection Time: 07/05/20  5:45 AM   Specimen: Nasopharyngeal Swab; Nasopharyngeal(NP) swabs in vial transport medium  Result Value Ref Range Status   SARS Coronavirus 2 by RT PCR NEGATIVE NEGATIVE Final    Comment: (NOTE) SARS-CoV-2 target nucleic acids are NOT DETECTED.  The SARS-CoV-2 RNA is generally detectable in upper respiratory specimens during the acute phase of infection. The lowest concentration of SARS-CoV-2 viral copies this assay can detect is 138 copies/mL. A negative result does not preclude SARS-Cov-2 infection and should not be used as the sole basis for treatment or other patient management decisions. A negative result may occur with  improper specimen collection/handling, submission of specimen other than nasopharyngeal swab, presence of viral mutation(s) within the areas targeted by this assay, and inadequate number of viral copies(<138 copies/mL). A negative result must be combined with clinical observations, patient history, and epidemiological information. The expected result is Negative.  Fact Sheet for Patients:  BloggerCourse.com  Fact Sheet for Healthcare Providers:  SeriousBroker.it  This test is no t yet approved or cleared by the [place] FDA and  has been authorized for detection and/or diagnosis of SARS-CoV-2 by FDA under an Emergency Use Authorization (EUA). This EUA will remain  in effect (meaning this test can be used) for the duration of the COVID-19 declaration under Section 564(b)(1) of the Act, 21 U.S.C.section 360bbb-3(b)(1), unless the authorization is terminated  or revoked sooner.       Influenza A by PCR NEGATIVE NEGATIVE Final   Influenza B by PCR NEGATIVE NEGATIVE Final     Comment: (NOTE) The Xpert Xpress SARS-CoV-2/FLU/RSV plus assay is intended as an aid in the diagnosis of influenza from Nasopharyngeal swab specimens and should not be used as a sole basis for treatment. Nasal washings and aspirates are unacceptable for Xpert Xpress SARS-CoV-2/FLU/RSV testing.  Fact Sheet for Patients: BloggerCourse.com  Fact Sheet for Healthcare Providers: SeriousBroker.it  This test is not yet approved or cleared by the [place] FDA and has been authorized for detection and/or diagnosis of SARS-CoV-2 by FDA under an Emergency Use Authorization (EUA). This EUA will remain in effect (meaning this test can be used) for the duration of the COVID-19 declaration under Section 564(b)(1) of the Act, 21 U.S.C. section 360bbb-3(b)(1), unless the authorization is terminated or  revoked.  Performed at [facility], [address] [place], [place] [address]   Aerobic Culture w Gram Stain (superficial specimen)     Status: None   Collection Time: 07/05/20  5:45 AM   Specimen: Ankle  Result Value Ref Range Status   Specimen Description ANKLE  Final   Special Requests LEFT  Final   Gram Stain   Final    NO WBC SEEN MODERATE GRAM POSITIVE COCCI MODERATE GRAM VARIABLE ROD Performed at [facility], [address] [place], [place] [address]    Culture FEW PSEUDOMONAS AERUGINOSA  Final   Report Status 07/07/2020 FINAL  Final   Organism ID, Bacteria PSEUDOMONAS AERUGINOSA  Final      Susceptibility   Pseudomonas aeruginosa - MIC*    CEFTAZIDIME <=1 SENSITIVE Sensitive     CIPROFLOXACIN <=0.25 SENSITIVE Sensitive     GENTAMICIN <=1 SENSITIVE Sensitive     IMIPENEM 1 SENSITIVE Sensitive     PIP/TAZO <=4 SENSITIVE Sensitive     CEFEPIME 2 SENSITIVE Sensitive     * FEW PSEUDOMONAS AERUGINOSA  Urine Culture     Status: None   Collection Time: 07/10/20  9:01 AM   Specimen: Urine, Random  Result Value  Ref Range Status   Specimen Description   Final    URINE, RANDOM Performed at [facility], [address] [place], [place] [address]    Special Requests   Final    NONE Performed at [facility], [address] [place], [place] [address]    Culture   Final    NO GROWTH Performed at [facility], [address] [place], [place] [address]    Report Status 07/11/2020 FINAL  Final  CULTURE, BLOOD (ROUTINE X 2) w Reflex to ID Panel     Status: None (Preliminary result)   Collection Time: 07/10/20 10:52 AM   Specimen: BLOOD LEFT HAND  Result Value Ref Range Status   Specimen Description BLOOD LEFT HAND  Final   Special Requests   Final    BOTTLES DRAWN AEROBIC AND ANAEROBIC Blood Culture adequate volume   Culture   Final    NO GROWTH 4 DAYS Performed at [facility], [address] [place], [place] [address]    Report Status PENDING  Incomplete  CULTURE, BLOOD (ROUTINE X 2) w Reflex to ID Panel     Status: None (Preliminary result)   Collection Time: 07/10/20 10:54 AM   Specimen: Left Antecubital; Blood  Result Value Ref Range Status   Specimen Description LEFT ANTECUBITAL  Final   Special Requests   Final    BOTTLES DRAWN AEROBIC AND ANAEROBIC Blood Culture adequate volume   Culture   Final    NO GROWTH 4 DAYS Performed at [facility], [address] [place], [place] [address]    Report Status PENDING  Incomplete    Radiology Studies: No results found.    Ronald Huerta T. [facility]  If 7PM-7AM, please contact night-coverage [email or web address] 07/14/2020, 2:35 PM

## 2020-07-15 DIAGNOSIS — T148XXA Other injury of unspecified body region, initial encounter: Secondary | ICD-10-CM

## 2020-07-15 DIAGNOSIS — R338 Other retention of urine: Secondary | ICD-10-CM

## 2020-07-15 DIAGNOSIS — L089 Local infection of the skin and subcutaneous tissue, unspecified: Secondary | ICD-10-CM

## 2020-07-15 DIAGNOSIS — N401 Enlarged prostate with lower urinary tract symptoms: Secondary | ICD-10-CM

## 2020-07-15 LAB — COMPREHENSIVE METABOLIC PANEL
ALT: 54 U/L — ABNORMAL HIGH (ref 0–44)
AST: 53 U/L — ABNORMAL HIGH (ref 15–41)
Albumin: 2.6 g/dL — ABNORMAL LOW (ref 3.5–5.0)
Alkaline Phosphatase: 247 U/L — ABNORMAL HIGH (ref 38–126)
Anion gap: 5 (ref 5–15)
BUN: 6 mg/dL — ABNORMAL LOW (ref 8–23)
CO2: 25 mmol/L (ref 22–32)
Calcium: 8 mg/dL — ABNORMAL LOW (ref 8.9–10.3)
Chloride: 105 mmol/L (ref 98–111)
Creatinine, Ser: 0.68 mg/dL (ref 0.61–1.24)
GFR, Estimated: >60 mL/min (ref >60)
Glucose, Bld: 91 mg/dL (ref 70–99)
Potassium: 4.1 mmol/L (ref 3.5–5.1)
Sodium: 135 mmol/L (ref 135–145)
Total Bilirubin: 0.8 mg/dL (ref 0.3–1.2)
Total Protein: 6.1 g/dL — ABNORMAL LOW (ref 6.5–8.1)

## 2020-07-15 LAB — PHOSPHORUS: Phosphorus: 2.9 mg/dL (ref 2.5–4.6)

## 2020-07-15 LAB — CBC
HCT: 25.9 % — ABNORMAL LOW (ref 39.0–52.0)
Hemoglobin: 8.8 g/dL — ABNORMAL LOW (ref 13.0–17.0)
MCH: 33.2 pg (ref 26.0–34.0)
MCHC: 34 g/dL (ref 30.0–36.0)
MCV: 97.7 fL (ref 80.0–100.0)
Platelets: 358 10*3/uL (ref 150–400)
RBC: 2.65 MIL/uL — ABNORMAL LOW (ref 4.22–5.81)
RDW: 16.7 % — ABNORMAL HIGH (ref 11.5–15.5)
WBC: 10.9 10*3/uL — ABNORMAL HIGH (ref 4.0–10.5)
nRBC: 0.8 % — ABNORMAL HIGH (ref 0.0–0.2)

## 2020-07-15 LAB — CULTURE, BLOOD (ROUTINE X 2)
Culture: NO GROWTH
Culture: NO GROWTH
Special Requests: ADEQUATE
Special Requests: ADEQUATE

## 2020-07-15 LAB — MAGNESIUM: Magnesium: 2.1 mg/dL (ref 1.7–2.4)

## 2020-07-15 MED ORDER — GABAPENTIN 100 MG PO CAPS
ORAL_CAPSULE | ORAL | Status: DC
Start: 2020-07-15 — End: 2020-07-21

## 2020-07-15 MED ORDER — FOLIC ACID 400 MCG PO TABS: 400.0000 ug | ORAL_TABLET | Freq: Every day | ORAL | Status: DC

## 2020-07-15 MED ORDER — FERROUS SULFATE 325 (65 FE) MG PO TABS
325.0000 mg | ORAL_TABLET | Freq: Every day | ORAL | 3 refills | Status: DC
Start: 2020-07-15 — End: 2020-07-21

## 2020-07-15 MED ORDER — CIPROFLOXACIN HCL 500 MG PO TABS: 500.0000 mg | ORAL_TABLET | Freq: Two times a day (BID) | ORAL | 0 refills | Status: DC

## 2020-07-15 MED ORDER — FINASTERIDE 5 MG PO TABS: 5.0000 mg | ORAL_TABLET | Freq: Every day | ORAL | 0 refills | Status: DC

## 2020-07-15 MED ORDER — AMOXICILLIN-POT CLAVULANATE 875-125 MG PO TABS: 1.0000 | ORAL_TABLET | Freq: Two times a day (BID) | ORAL | 0 refills | Status: DC

## 2020-07-15 MED ORDER — TAMSULOSIN HCL 0.4 MG PO CAPS: 0.4000 mg | ORAL_CAPSULE | Freq: Every day | ORAL | 0 refills | Status: DC

## 2020-07-15 NOTE — Progress Notes (Signed)
Pt being discharged home, discharge instructions reviewed with pt and son using ASL interpreter, states understanding, wound care instructions and supplies sent with pt, also foley care and education given, states understanding, pt with no complaints at discharge
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
## 2020-07-15 NOTE — Progress Notes (Signed)
Per pt, ok to give updates to son Ronald Huerta if he calls, attempted to return Bert's call but no answer

## 2020-07-15 NOTE — TOC Progression Note (Addendum)
Transition of Care [facility]) - Progression Note    Patient Details  Name: Danilo Cappiello MRN: [id number] Date of Birth: 03-26-1934  Transition of Care [facility]) CM/SW Contact  Margarito Liner, LCSW Phone Number: 07/15/2020, 2:36 PM  Clinical Narrative:  [facility] aware of plan for discharge today. Due to staffing, will not have RN available for twice per week dressing changes. Son Doristine Church aware and asked if any other agencies could provide nursing services. Explained that home health agencies do not often want to take [facility] Medicare because they are not a great payor for these services. Son asked about how he got [facility] Medicaid. Explained he would have to talk to patient's Medicare representative about that. Son requesting aide services for patient. Explained that this is a private pay service. His mother had aide services prior to her death several years ago but he said she had Medicaid. CSW checked with financial counselor who stated patient does not have Medicaid. Will email son list for personal care services. [facility] representative said she would also discuss with family. Son [place] is here. Nurse will provide wound care teaching and send him home with a month's worth of supplies.  Expected Discharge Plan: Home w Home Health Services Barriers to Discharge: Continued Medical Work up  Expected Discharge Plan and Services Expected Discharge Plan: Home w Home Health Services   Discharge Planning Services: CM Consult Post Acute Care Choice: Home Health Living arrangements for the past 2 months: Single Family Home Expected Discharge Date: 07/15/20               DME Arranged: N/A         HH Arranged: PT, OT HH Agency: [facility] Date HH Agency Contacted: 07/07/20 Time HH Agency Contacted: 1230 Representative spoke with at [facility] Agency: Elnita Maxwell   Social Determinants of Health (SDOH) Interventions    Readmission Risk Interventions No flowsheet data found.

## 2020-07-15 NOTE — TOC Transition Note (Addendum)
Transition of Care [facility]) - CM/SW Discharge Note   Patient Details  Name: Ronald Huerta MRN: [id number] Date of Birth: September 20, 1934  Transition [facility]) CM/SW Contact:  Margarito Liner, LCSW Phone Number: 07/15/2020, 2:52 PM   Clinical Narrative:   Patient has orders to discharge home today. [facility] representative is aware. No further concerns. CSW signing off.  3:02 pm: Per RN, patient/son requesting 3-in-1 to be delivered to his home. Ordered through [employer]. CSW signing off.  Final next level of care: Home w Home Health Services Barriers to Discharge: Barriers Resolved   Patient Goals and CMS Choice Patient states their goals for this hospitalization and ongoing recovery are:: for his wound to get better and to go home CMS Medicare.gov Compare Post Acute Care list provided to:: Patient Choice offered to / list presented to : Patient  Discharge Placement                  Name of family member notified: Sherron Monday Patient and family notified of of transfer: 07/15/20  Discharge Plan and Services   Discharge Planning Services: CM Consult Post Acute Care Choice: Home Health          DME Arranged: N/A         HH Arranged: RN, PT, OT [facility] Agency: [facility] Date [facility] Agency Contacted: 07/15/20 Time HH Agency Contacted: 1230 Representative spoke with at [facility] Agency: Becky Sax  Social Determinants of Health (SDOH) Interventions     Readmission Risk Interventions No flowsheet data found.

## 2020-07-15 NOTE — Progress Notes (Signed)
Ronald Huerta updated, Ronald Italy messaged regarding discharge order

## 2020-07-15 NOTE — Progress Notes (Signed)
Date of Admission:  07/05/2020    ID: Ronald Huerta is a 86 y.o. male  Principal Problem:   Wound infection-left leg Active Problems:   BPH with obstruction/lower urinary tract symptoms   Cellulitis of leg, left   Acquired hypothyroidism   Deaf, nonspeaking   GERD (gastroesophageal reflux disease)   Abdominal pain    Subjective: Doing much better Says he is happy to go home  Medications:   acetaminophen  1,000 mg Oral Q8H   amoxicillin-clavulanate  1 tablet Oral Q12H   Chlorhexidine Gluconate Cloth  6 each Topical Daily   ciprofloxacin  500 mg Oral BID   enoxaparin (LOVENOX) injection  40 mg Subcutaneous Q24H   ferrous sulfate  325 mg Oral Q lunch   finasteride  5 mg Oral Daily   folic acid  500 mcg Oral Daily   gabapentin  100 mg Oral TID   levothyroxine  25 mcg Oral Q0600   multivitamin with minerals  1 tablet Oral Daily   omega-3 acid ethyl esters  1 g Oral Daily   pantoprazole  40 mg Oral Daily   polyethylene glycol  17 g Oral BID   senna  1 tablet Oral BID   tamsulosin  0.4 mg Oral Daily   vitamin B-12  1,000 mcg Oral Daily    Objective: Vital signs in last 24 hours: Temp:  [97.8 F (36.6 C)-98.3 F (36.8 C)] 97.8 F (36.6 C) (06/15 0902) Pulse Rate:  [82-93] 82 (06/15 0902) Resp:  [16-18] 18 (06/15 0902) BP: (122-156)/(59-93) 129/59 (06/15 0902) SpO2:  [90 %-95 %] 94 % (06/15 0902)  PHYSICAL EXAM:  General: Alert, cooperative, no distress, appears stated age. Deaf Lungs: Clear to auscultation bilaterally. No Wheezing or Rhonchi. No rales. Heart: Regular rate and rhythm, no murmur, rub or gallop. Abdomen: Soft, non-tender,not distended. Bowel sounds normal. No masses Extremities: left leg superficial wound on the medial ankle and surrounding erythema has improved 07/15/20   A week ago   Swelling better Foley in place Skin: No rashes or lesions. Or bruising Lymph: Cervical, supraclavicular normal. Neurologic: Grossly non-focal  Lab  Results Recent Labs    07/14/20 0549 07/15/20 0626  WBC 9.8 10.9*  HGB 8.3* 8.8*  HCT 24.3* 25.9*  NA 138 135  K 3.6 4.1  CL 106 105  CO2 26 25  BUN 9 6*  CREATININE 0.87 0.68   Liver Panel Recent Labs    07/14/20 0549 07/15/20 0626  PROT 5.4* 6.1*  ALBUMIN 2.3* 2.6*  AST 59* 53*  ALT 55* 54*  ALKPHOS 215* 247*  BILITOT 0.8 0.8   Sedimentation Rate No results for input(s): ESRSEDRATE in the last 72 hours. C-Reactive Protein No results for input(s): CRP in the last 72 hours.  Microbiology:  Studies/Results: No results found.   Assessment/Plan:  New leukocytosis and body aches have resolved.  Repeat urine culture and blood culture were negative.  Patient has been on Zosyn and daptomycin 07/10/2020 and they were changed to ciprofloxacin and Augmentin on 07/14/2020.  Last day of the antibiotic would be 07/21/2020.  Venous ulcer with cellulitis of the left leg.  Pseudomonas in recent culture.  In January it was staph aureus and Enterococcus and Bacteroides.  In February it was MRSA.  Ciprofloxacin treats MRSA as well.  And Augmentin treats Bacteroides and  Enterococcus..  Urinary retention  due to BPH and has a Foley catheter placed during this hospitalization. Seen by urologist  Delirium  resolved.  ID will  sign off- call if needed
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
## 2020-07-21 ENCOUNTER — Ambulatory Visit: Payer: Medicare Other | Admitting: Urology

## 2020-07-21 ENCOUNTER — Encounter: Payer: Self-pay | Admitting: Urology

## 2020-07-21 ENCOUNTER — Other Ambulatory Visit: Payer: Self-pay

## 2020-07-21 ENCOUNTER — Ambulatory Visit (INDEPENDENT_AMBULATORY_CARE_PROVIDER_SITE_OTHER): Payer: Medicare Other | Admitting: Urology

## 2020-07-21 DIAGNOSIS — R339 Retention of urine, unspecified: Secondary | ICD-10-CM

## 2020-07-21 DIAGNOSIS — N138 Other obstructive and reflux uropathy: Secondary | ICD-10-CM | POA: Diagnosis not present

## 2020-07-21 DIAGNOSIS — N401 Enlarged prostate with lower urinary tract symptoms: Secondary | ICD-10-CM

## 2020-07-21 LAB — BLADDER SCAN AMB NON-IMAGING: Scan Result: 81

## 2020-07-22 ENCOUNTER — Ambulatory Visit: Payer: Medicare Other | Admitting: Urology

## 2020-07-22 ENCOUNTER — Ambulatory Visit (INDEPENDENT_AMBULATORY_CARE_PROVIDER_SITE_OTHER): Payer: Medicare Other | Admitting: Urology

## 2020-07-22 ENCOUNTER — Encounter: Payer: Self-pay | Admitting: Urology

## 2020-07-22 DIAGNOSIS — N401 Enlarged prostate with lower urinary tract symptoms: Secondary | ICD-10-CM

## 2020-07-22 DIAGNOSIS — R339 Retention of urine, unspecified: Secondary | ICD-10-CM

## 2020-07-22 DIAGNOSIS — N138 Other obstructive and reflux uropathy: Secondary | ICD-10-CM | POA: Diagnosis not present

## 2020-07-22 LAB — BLADDER SCAN AMB NON-IMAGING

## 2020-07-22 MED ORDER — FINASTERIDE 5 MG PO TABS: 5.0000 mg | ORAL_TABLET | Freq: Every day | ORAL | 3 refills | Status: AC
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
## 2020-08-14 ENCOUNTER — Encounter: Payer: Medicare Other | Attending: Physician Assistant | Admitting: Physician Assistant

## 2020-08-14 ENCOUNTER — Other Ambulatory Visit: Payer: Self-pay

## 2020-08-14 DIAGNOSIS — R609 Edema, unspecified: Secondary | ICD-10-CM | POA: Diagnosis not present

## 2020-08-14 DIAGNOSIS — I872 Venous insufficiency (chronic) (peripheral): Secondary | ICD-10-CM | POA: Insufficient documentation

## 2020-08-14 DIAGNOSIS — L97329 Non-pressure chronic ulcer of left ankle with unspecified severity: Secondary | ICD-10-CM | POA: Diagnosis present

## 2020-08-14 NOTE — Progress Notes (Addendum)
Ronald Huerta, Ronald D. ([id number]) Visit Report for 08/14/2020 Chief Complaint Document Details Patient Name: Ronald Huerta, Cannon D. Date of Service: 08/14/2020 10:15 AM Medical Record Number: [id number] Patient Account Number: [id number] Date of Birth/Sex: [birth year] (86 y.o. M) Treating RN: Primary Care Provider: Barbette ReichmannHande, Vishwanath Other Clinician: Referring Provider: Barbette ReichmannHande, Vishwanath Treating Provider/Extender: Rowan BlaseStone, Tieasha Larsen Weeks in Treatment: 0 Information Obtained from: Patient Chief Complaint Bilateral LE Edema with left medial ankle recurrent ulcers Electronic Signature(s) Signed: 08/14/2020 11:31:51 AM By: Lenda KelpStone III, Bence Trapp PA-C Entered By: Lenda KelpStone III, Kieron Kantner on 08/14/2020 11:31:51 Spratling, Sharrell KuFRANKLIN D. ([id number]) -------------------------------------------------------------------------------- HPI Details Patient Name: Ronald Huerta, Ronald D. Date of Service: 08/14/2020 10:15 AM Medical Record Number: [id number] Patient Account Number: [id number] Date of Birth/Sex: [birth year] (86 y.o. M) Treating RN: Primary Care Provider: Barbette ReichmannHande, Vishwanath Other Clinician: Referring Provider: Barbette ReichmannHande, Vishwanath Treating Provider/Extender: Rowan BlaseStone, Mackenzie Lia Weeks in Treatment: 0 History of Present Illness HPI Description: There is7/15/2022 upon evaluation today patient presents for initial evaluation here in our clinic concerning issues he has been having with lately having some swelling of his bilateral lower extremities. He did have a wound open on the medial ankle and left foot. With that being said that this has closed as of today. He is actually deaf and communicates by way of sign language we did have an interpreter here today who was extremely helpful. With that being said I do believe that the patient seems to be making good progress here fax to the overall improvement he has been on antibiotics and that was helpful as well. With that being said his son is with him who actually is also deaf. He does not have any good  compression stockings he has 1 stocking but states that he does not know if it is really ever been measured for his size. He is not having any pain currently other than in his left knee but again this seems to be more of an arthritis issue to be perfectly honest. Electronic Signature(s) Signed: 08/14/2020 4:39:49 PM By: Lenda KelpStone III, Garrett Mitchum PA-C Entered By: Lenda KelpStone III, Theo Reither on 08/14/2020 16:39:49 Beumer, Sharrell KuFRANKLIN D. ([id number]) -------------------------------------------------------------------------------- Physical Exam Details Patient Name: Ronald Huerta, Ronald D. Date of Service: 08/14/2020 10:15 AM Medical Record Number: [id number] Patient Account Number: [id number] Date of Birth/Sex: [birth year] (86 y.o. M) Treating RN: Primary Care Provider: Barbette ReichmannHande, Vishwanath Other Clinician: Referring Provider: Barbette ReichmannHande, Vishwanath Treating Provider/Extender: Rowan BlaseStone, Daziya Redmond Weeks in Treatment: 0 Constitutional Well-nourished and well-hydrated in no acute distress. Respiratory normal breathing without difficulty. Cardiovascular 2+ dorsalis pedis/posterior tibialis pulses. trace pitting edema of the bilateral lower extremities. Psychiatric this patient is able to make decisions and demonstrates good insight into disease process. Alert and Oriented x 3. pleasant and cooperative. Notes Upon inspection patient's wound bed actually showed signs of good granulation and epithelization in fact there is nothing open at this point which is great news and overall very pleased in that regard. With that being said I think just as a consult working to see him today and go ahead and see about getting him set up with some compression stockings. Unfortunately he seems to have limited access to anyone who can help him with communicating over the phone so even ordering compression stockings from elastic therapy is good to be difficult for him. For that reason we did go ahead and actually take care of that today. We got them ordered and  paid for them to be shipped to his home which hopefully should be beneficial. I think this is the best thing to do to try to  prevent this from continuing to recur. Electronic Signature(s) Signed: 08/14/2020 4:40:50 PM By: Lenda Kelp PA-C Entered By: Lenda Kelp on 08/14/2020 16:40:50 Sekula, [name] ([id number]) -------------------------------------------------------------------------------- Physician Orders Details Patient Name: Ronald Dickens. Date of Service: 08/14/2020 10:15 AM Medical Record Number: [id number] Patient Account Number: [id number] Date of Birth/Sex: [birth year] (86 y.o. M) Treating RN: Rogers Blocker Primary Care Provider: Barbette Reichmann Other Clinician: Referring Provider: Barbette Reichmann Treating Provider/Extender: Rowan Blase in Treatment: 0 Verbal / Phone Orders: No Diagnosis Coding ICD-10 Coding Code Description I87.2 Venous insufficiency (chronic) (peripheral) Discharge From [facility] Services o Consult Only - No open wound-[facility] contacted by this nurse on behalf of patient, spoke with Venita Sheffield gave patient leg measurements and form of payment to order compression stockings for patient. Patient and son educated on compression garments Electronic Signature(s) Signed: 08/14/2020 4:00:49 PM By: Rogers Blocker RN Signed: 08/14/2020 4:42:29 PM By: Lenda Kelp PA-C Previous Signature: 08/14/2020 3:56:07 PM Version By: Rogers Blocker RN Entered By: Rogers Blocker on 08/14/2020 16:00:49 Chriscoe, [name] ([id number]) -------------------------------------------------------------------------------- Problem List Details Patient Name: Huerta, Ronald. Date of Service: 08/14/2020 10:15 AM Medical Record Number: [id number] Patient Account Number: [id number] Date of Birth/Sex: 12/22/1934 (85 y.o. M) Treating RN: Primary Care Provider: Barbette Reichmann Other Clinician: Referring Provider: Barbette Reichmann Treating Provider/Extender:  Rowan Blase in Treatment: 0 Active Problems ICD-10 Encounter Code Description Active Date MDM Diagnosis I87.2 Venous insufficiency (chronic) (peripheral) 08/14/2020 No Yes Inactive Problems Resolved Problems Electronic Signature(s) Signed: 08/14/2020 11:31:29 AM By: Lenda Kelp PA-C Entered By: Lenda Kelp on 08/14/2020 11:31:29 Siglin, [name] ([id number]) -------------------------------------------------------------------------------- Progress Note Details Patient Name: Ronald Dickens. Date of Service: 08/14/2020 10:15 AM Medical Record Number: [id number] Patient Account Number: [id number] Date of Birth/Sex: Jun 19, 1934 (85 y.o. M) Treating RN: Primary Care Provider: Barbette Reichmann Other Clinician: Referring Provider: Barbette Reichmann Treating Provider/Extender: Rowan Blase in Treatment: 0 Subjective Chief Complaint Information obtained from Patient Bilateral LE Edema with left medial ankle recurrent ulcers History of Present Illness (HPI) There is7/15/2022 upon evaluation today patient presents for initial evaluation here in our clinic concerning issues he has been having with lately having some swelling of his bilateral lower extremities. He did have a wound open on the medial ankle and left foot. With that being said that this has closed as of today. He is actually deaf and communicates by way of sign language we did have an interpreter here today who was extremely helpful. With that being said I do believe that the patient seems to be making good progress here fax to the overall improvement he has been on antibiotics and that was helpful as well. With that being said his son is with him who actually is also deaf. He does not have any good compression stockings he has 1 stocking but states that he does not know if it is really ever been measured for his size. He is not having any pain currently other than in his left knee but again this seems to be more  of an arthritis issue to be perfectly honest. Patient History Allergies No Known Drug Allergies Medical History Hematologic/Lymphatic Patient has history of Anemia Endocrine Denies history of Type I Diabetes, Type II Diabetes Genitourinary Denies history of End Stage Renal Disease Musculoskeletal Patient has history of Osteoarthritis Neurologic Patient has history of Neuropathy Denies history of Dementia, Quadriplegia, Paraplegia, Seizure Disorder Medical And Surgical History Notes Gastrointestinal IBS Review of Systems (ROS) Constitutional  Symptoms (General Health) Denies complaints or symptoms of Fatigue, Fever, Chills, Marked Weight Change. Eyes Denies complaints or symptoms of Dry Eyes, Vision Changes, Glasses / Contacts. Ear/Nose/Mouth/Throat Denies complaints or symptoms of Difficult clearing ears, Sinusitis. Hematologic/Lymphatic Denies complaints or symptoms of Bleeding / Clotting Disorders, Human Immunodeficiency Virus. Respiratory Denies complaints or symptoms of Chronic or frequent coughs, Shortness of Breath. Cardiovascular Denies complaints or symptoms of Chest pain, LE edema. Gastrointestinal Denies complaints or symptoms of Frequent diarrhea, Nausea, Vomiting. Endocrine Denies complaints or symptoms of Hepatitis, Thyroid disease, Polydypsia (Excessive Thirst). Genitourinary Denies complaints or symptoms of Kidney failure/ Dialysis, Incontinence/dribbling. Immunological Denies complaints or symptoms of Hives, Itching. Integumentary (Skin) Complains or has symptoms of Wounds, Swelling. Denies complaints or symptoms of Bleeding or bruising tendency, Breakdown. Musculoskeletal Complains or has symptoms of Muscle Weakness. Denies complaints or symptoms of Muscle Pain. Neurologic Denies complaints or symptoms of Numbness/parasthesias, Focal/Weakness. JONTEZ, REDFIELD ([id number]) Psychiatric Denies complaints or symptoms of Anxiety,  Claustrophobia. Objective Constitutional Well-nourished and well-hydrated in no acute distress. Vitals Time Taken: 11:52 AM. General Notes: vitals not obtained, no open wound-consult only Respiratory normal breathing without difficulty. Cardiovascular 2+ dorsalis pedis/posterior tibialis pulses. trace pitting edema of the bilateral lower extremities. Psychiatric this patient is able to make decisions and demonstrates good insight into disease process. Alert and Oriented x 3. pleasant and cooperative. General Notes: Upon inspection patient's wound bed actually showed signs of good granulation and epithelization in fact there is nothing open at this point which is great news and overall very pleased in that regard. With that being said I think just as a consult working to see him today and go ahead and see about getting him set up with some compression stockings. Unfortunately he seems to have limited access to anyone who can help him with communicating over the phone so even ordering compression stockings from elastic therapy is good to be difficult for him. For that reason we did go ahead and actually take care of that today. We got them ordered and paid for them to be shipped to his home which hopefully should be beneficial. I think this is the best thing to do to try to prevent this from continuing to recur. Assessment Active Problems ICD-10 Venous insufficiency (chronic) (peripheral) Plan [facility]charge From [facility] Services: Consult Only - No open wound-[facility] contacted by this nurse on behalf of patient, spoke with Venita Sheffield gave patient leg measurements and form of payment to order compression stockings for patient. Patient and son educated on compression garments 1. At this point were to see the patient for consult only as he has no open wounds but we did help him with ordering some compression socks hopefully they should be beneficial for him. 2. I am also going to have the patient  continue to elevate his legs I did through the sign language interpreter communicated to the patient the needs to elevate his legs much as possible and wear the compression socks pretty much on the time he gets up in the morning to when he goes to bed at night but not at bedtime. The patient and his son voiced understanding. We will see the patient back for follow-up visit as needed. Electronic Signature(s) Signed: 08/14/2020 4:41:44 PM By: Lenda Kelp PA-C Entered By: Lenda Kelp on 08/14/2020 16:41:43 Karim, [name] ([id number]) Stockhausen, [name] ([id number]) -------------------------------------------------------------------------------- ROS/PFSH Details Patient Name: Ronald Dickens. Date of Service: 08/14/2020 10:15 AM Medical Record Number: [id number] Patient Account Number: [id number] Date of  Birth/Sex: [birth year] (85 y.o. M) Treating RN: Rogers Blocker Primary Care Provider: Barbette Reichmann Other Clinician: Referring Provider: Barbette Reichmann Treating Provider/Extender: Rowan Blase in Treatment: 0 Constitutional Symptoms (General Health) Complaints and Symptoms: Negative for: Fatigue; Fever; Chills; Marked Weight Change Eyes Complaints and Symptoms: Negative for: Dry Eyes; Vision Changes; Glasses / Contacts Ear/Nose/Mouth/Throat Complaints and Symptoms: Negative for: Difficult clearing ears; Sinusitis Hematologic/Lymphatic Complaints and Symptoms: Negative for: Bleeding / Clotting Disorders; Human Immunodeficiency Virus Medical History: Positive for: Anemia Respiratory Complaints and Symptoms: Negative for: Chronic or frequent coughs; Shortness of Breath Cardiovascular Complaints and Symptoms: Negative for: Chest pain; LE edema Gastrointestinal Complaints and Symptoms: Negative for: Frequent diarrhea; Nausea; Vomiting Medical History: Past Medical History Notes: IBS Endocrine Complaints and Symptoms: Negative for: Hepatitis; Thyroid disease;  Polydypsia (Excessive Thirst) Medical History: Negative for: Type I Diabetes; Type II Diabetes Genitourinary Complaints and Symptoms: Negative for: Kidney failure/ Dialysis; Incontinence/dribbling Medical History: Negative for: End Stage Renal Disease Immunological Chachere, TAMARCUS CONDIE. ([id number]) Complaints and Symptoms: Negative for: Hives; Itching Integumentary (Skin) Complaints and Symptoms: Positive for: Wounds; Swelling Negative for: Bleeding or bruising tendency; Breakdown Musculoskeletal Complaints and Symptoms: Positive for: Muscle Weakness Negative for: Muscle Pain Medical History: Positive for: Osteoarthritis Neurologic Complaints and Symptoms: Negative for: Numbness/parasthesias; Focal/Weakness Medical History: Positive for: Neuropathy Negative for: Dementia; Quadriplegia; Paraplegia; Seizure Disorder Psychiatric Complaints and Symptoms: Negative for: Anxiety; Claustrophobia Oncologic Immunizations Pneumococcal Vaccine: Received Pneumococcal Vaccination: No Implantable Devices None Electronic Signature(s) Signed: 08/14/2020 4:02:25 PM By: Rogers Blocker RN Signed: 08/14/2020 4:42:29 PM By: Lenda Kelp PA-C Entered By: Rogers Blocker on 08/14/2020 12:00:16 Mcknight, [name] ([id number]) -------------------------------------------------------------------------------- SuperBill Details Patient Name: Ronald Dickens. Date of Service: 08/14/2020 Medical Record Number: [id number] Patient Account Number: [id number] Date of Birth/Sex: Jul 17, 1934 (85 y.o. M) Treating RN: Rogers Blocker Primary Care Provider: Barbette Reichmann Other Clinician: Referring Provider: Barbette Reichmann Treating Provider/Extender: Rowan Blase in Treatment: 0 Diagnosis Coding ICD-10 Codes Code Description I87.2 Venous insufficiency (chronic) (peripheral) Facility Procedures CPT4 Code: 62831517 Description: 99213 - WOUND CARE VISIT-LEV 3 EST PT Modifier: Quantity:  1 Physician Procedures CPT4 Code: 6160737 Description: WC PHYS LEVEL 3 o NEW PT Modifier: Quantity: 1 CPT4 Code: Description: ICD-10 Diagnosis Description I87.2 Venous insufficiency (chronic) (peripheral) Modifier: Quantity: Electronic Signature(s) Signed: 08/14/2020 4:42:14 PM By: Lenda Kelp PA-C Previous Signature: 08/14/2020 3:57:04 PM Version By: Rogers Blocker RN Entered By: Lenda Kelp on 08/14/2020 16:42:14

## 2020-08-14 NOTE — Progress Notes (Signed)
[name], [name] ([id number]) Visit Report for 08/14/2020 Allergy List Details Patient Name: Ronald Huerta, Ronald Huerta. Date of Service: 08/14/2020 10:15 AM Medical Record Number: [id number] Patient Account Number: [id number] Date of Birth/Sex: 26-Jul-1934 (85 y.o. M) Treating RN: Rogers Blocker Primary Care Lillie Portner: Barbette Reichmann Other Clinician: Referring Lagretta Loseke: Barbette Reichmann Treating Okema Rollinson/Extender: Allen Derry Weeks in Treatment: 0 Allergies Active Allergies No Known Drug Allergies Allergy Notes Electronic Signature(s) Signed: 08/14/2020 11:58:18 AM By: Rogers Blocker RN Entered By: Rogers Blocker on 08/14/2020 11:58:18 [name], [name] ([id number]) -------------------------------------------------------------------------------- Arrival Information Details Patient Name: Ronald Huerta. Date of Service: 08/14/2020 10:15 AM Medical Record Number: [id number] Patient Account Number: [id number] Date of Birth/Sex: 05-23-1934 (85 y.o. M) Treating RN: Rogers Blocker Primary Care Yulanda Diggs: Barbette Reichmann Other Clinician: Referring Levy Wellman: Barbette Reichmann Treating Kajah Santizo/Extender: Rowan Blase in Treatment: 0 Visit Information Patient Arrived: Wheel Chair Arrival Time: 10:21 Accompanied By: son/interpreter Transfer Assistance: Manual Patient Identification Verified: Yes Secondary Verification Process Completed: Yes Electronic Signature(s) Signed: 08/14/2020 3:59:49 PM By: Rogers Blocker RN Entered By: Rogers Blocker on 08/14/2020 15:59:48 [name], [name] ([id number]) -------------------------------------------------------------------------------- Clinic Level of Care Assessment Details Patient Name: Ronald Huerta. Date of Service: 08/14/2020 10:15 AM Medical Record Number: [id number] Patient Account Number: [id number] Date of Birth/Sex: 10-Feb-1934 (85 y.o. M) Treating RN: Rogers Blocker Primary Care Honor Fairbank: Barbette Reichmann Other  Clinician: Referring Breia Ocampo: Barbette Reichmann Treating Shya Kovatch/Extender: Rowan Blase in Treatment: 0 Clinic Level of Care Assessment Items TOOL 2 Quantity Score []  - Use when only an EandM is performed on the INITIAL visit 0 ASSESSMENTS - Nursing Assessment / Reassessment X - General Physical Exam (combine w/ comprehensive assessment (listed just below) when performed on new 1 20 pt. evals) X- 1 25 Comprehensive Assessment (HX, ROS, Risk Assessments, Wounds Hx, etc.) ASSESSMENTS - Wound and Skin Assessment / Reassessment []  - Simple Wound Assessment / Reassessment - one wound 0 []  - 0 Complex Wound Assessment / Reassessment - multiple wounds X- 1 10 Dermatologic / Skin Assessment (not related to wound area) ASSESSMENTS - Ostomy and/or Continence Assessment and Care []  - Incontinence Assessment and Management 0 []  - 0 Ostomy Care Assessment and Management (repouching, etc.) PROCESS - Coordination of Care X - Simple Patient / Family Education for ongoing care 1 15 []  - 0 Complex (extensive) Patient / Family Education for ongoing care []  - 0 Staff obtains , Records, Test Results / Process Orders []  - 0 Staff telephones HHA, Nursing Homes / Clarify orders / etc []  - 0 Routine Transfer to another Facility (non-emergent condition) []  - 0 Routine Hospital Admission (non-emergent condition) []  - 0 New Admissions / / Ordering NPWT, Apligraf, etc. []  - 0 Emergency Hospital Admission (emergent condition) X- 1 10 Simple Discharge Coordination []  - 0 Complex (extensive) Discharge Coordination PROCESS - Special Needs []  - Pediatric / Minor Patient Management 0 []  - 0 Isolation Patient Management []  - 0 Hearing / Language / Visual special needs []  - 0 Assessment of Community assistance (transportation, D/C planning, etc.) []  - 0 Additional assistance / Altered mentation []  - 0 Support Surface(s) Assessment (bed, cushion, seat,  etc.) INTERVENTIONS - Wound Cleansing / Measurement []  - Wound Imaging (photographs - any number of wounds) 0 []  - 0 Wound Tracing (instead of photographs) []  - 0 Simple Wound Measurement - one wound []  - 0 Complex Wound Measurement - multiple wounds [name], [name] ( ) []  - 0 Simple Wound Cleansing - one wound []  -  0 Complex Wound Cleansing - multiple wounds INTERVENTIONS - Wound Dressings []  - Small Wound Dressing one or multiple wounds 0 []  - 0 Medium Wound Dressing one or multiple wounds []  - 0 Large Wound Dressing one or multiple wounds []  - 0 Application of Medications - injection INTERVENTIONS - Miscellaneous []  - External ear exam 0 []  - 0 Specimen Collection (cultures, biopsies, blood, body fluids, etc.) []  - 0 Specimen(s) / Culture(s) sent or taken to Lab for analysis []  - 0 Patient Transfer (multiple staff / Lift / Similar devices) []  - 0 Simple Staple / Suture removal (25 or less) []  - 0 Complex Staple / Suture removal (26 or more) []  - 0 Hypo / Hyperglycemic Management (close monitor of Blood Glucose) []  - 0 Ankle / Brachial Index (ABI) - do not check if billed separately Has the patient been seen at the hospital within the last three years: Yes Total Score: 80 Level Of Care: New/Established - Level 3 Electronic Signature(s) Signed: 08/14/2020 4:02:25 PM By: RN Entered By: on 08/14/2020 15:56:58 Brittian, ( ) -------------------------------------------------------------------------------- Encounter Discharge Information Details Patient Name: . Date of Service: 08/14/2020 10:15 AM Medical Record Number: Patient Account Number: Date of Birth/Sex: 1934-06-02 (85 y.o. M) Treating RN: 08/16/2020 Primary Care Riona Lahti: Rogers Blocker Other Clinician: Referring Rykker Coviello: Rogers Blocker Treating Kalee Broxton/Extender: 08/16/2020 in Treatment:  0 Encounter Discharge Information Items Discharge Condition: Stable Ambulatory Status: Wheelchair Discharge Destination: Home Transportation: Private Auto Accompanied By: son/interpreter Schedule Follow-up Appointment: No Clinical Summary of Care: Electronic Signature(s) Signed: 08/14/2020 3:59:37 PM By: [id number] RN Entered By: Ronald Huerta on 08/14/2020 15:59:37 Lighty, [id number] ([id number]) -------------------------------------------------------------------------------- Lower Extremity Assessment Details Patient Name: 11/14/1934. Date of Service: 08/14/2020 10:15 AM Medical Record Number: Rogers Blocker Patient Account Number: Barbette Reichmann Date of Birth/Sex: October 16, 1934 (85 y.o. M) Treating RN: 08/16/2020 Primary Care Divina Neale: Rogers Blocker Other Clinician: Referring Pinki Rottman: Rogers Blocker Treating Chandrea Zellman/Extender: 08/16/2020 Weeks in Treatment: 0 Edema Assessment Assessed: [Left: Yes] [Right: No] Edema: [Left: Ye] [Right: s] Vascular Assessment Pulses: Dorsalis Pedis Palpable: [Left:Yes] Electronic Signature(s) Signed: 08/14/2020 11:55:10 AM By: [id number] RN Entered By: Ronald Huerta on 08/14/2020 11:55:10 Bolin, [id number] ([id number]) -------------------------------------------------------------------------------- Multi Wound Chart Details Patient Name: 11/14/1934. Date of Service: 08/14/2020 10:15 AM Medical Record Number: Rogers Blocker Patient Account Number: Barbette Reichmann Date of Birth/Sex: 10-16-1934 (85 y.o. M) Treating RN: 08/16/2020 Primary Care Debbie Bellucci: Rogers Blocker Other Clinician: Referring Dejia Ebron: Rogers Blocker Treating Khaniyah Bezek/Extender: 08/16/2020 Weeks in Treatment: 0 Wound Assessments Treatment Notes Electronic Signature(s) Signed: 08/14/2020 3:53:38 PM By: [id number] RN Entered By: Ronald Huerta on 08/14/2020 15:53:38 Shieh, [id number]  ([id number]) -------------------------------------------------------------------------------- Multi-Disciplinary Care Plan Details Patient Name: 11/14/1934. Date of Service: 08/14/2020 10:15 AM Medical Record Number: Rogers Blocker Patient Account Number: Barbette Reichmann Date of Birth/Sex: 05/07/1934 (85 y.o. M) Treating RN: 08/16/2020 Primary Care Citlalli Weikel: Rogers Blocker Other Clinician: Referring Oneill Bais: Rogers Blocker Treating Delayni Streed/Extender: 08/16/2020 in Treatment: 0 Active Inactive Electronic Signature(s) Signed: 08/14/2020 3:53:26 PM By: [id number] RN Entered By: Ronald Huerta on 08/14/2020 15:53:26 Banes, [id number] ([id number]) -------------------------------------------------------------------------------- Pain Assessment Details Patient Name: 11/14/1934. Date of Service: 08/14/2020 10:15 AM Medical Record Number: Rogers Blocker Patient Account Number: Barbette Reichmann Date of Birth/Sex: August 06, 1934 (85 y.o. M) Treating RN: 08/16/2020 Primary Care Alano Blasco: Rogers Blocker Other Clinician: Referring Madysen Faircloth: Rogers Blocker Treating Masami Plata/Extender:  Allen Derry Weeks in Treatment: 0 Active Problems Location of Pain Severity and Description of Pain Patient Has Paino Yes Site Locations Rate the pain. Current Pain Level: 10 Pain Management and Medication Current Pain Management: Notes c/o pain in left knee Electronic Signature(s) Signed: 08/14/2020 4:02:25 PM By: Rogers Blocker RN Entered By: Rogers Blocker on 08/14/2020 10:57:49 Traum, [name] ([id number]) -------------------------------------------------------------------------------- Patient/Caregiver Education Details Patient Name: Ronald Huerta. Date of Service: 08/14/2020 10:15 AM Medical Record Number: [id number] Patient Account Number: [id number] Date of Birth/Gender: 06/26/1934 (85 y.o. M) Treating RN: Rogers Blocker Primary Care Physician: Barbette Reichmann Other  Clinician: Referring Physician: Barbette Reichmann Treating Physician/Extender: Rowan Blase in Treatment: 0 Education Assessment Education Provided To: Patient and Caregiver son Education Topics Provided Notes Compression garment education ASL interpreter present Electronic Signature(s) Signed: 08/14/2020 4:02:25 PM By: Rogers Blocker RN Entered By: Rogers Blocker on 08/14/2020 15:57:56 Lukach, [name] ([id number]) -------------------------------------------------------------------------------- Vitals Details Patient Name: Ronald Huerta. Date of Service: 08/14/2020 10:15 AM Medical Record Number: [id number] Patient Account Number: [id number] Date of Birth/Sex: 25-Aug-1934 (85 y.o. M) Treating RN: Rogers Blocker Primary Care Nickolette Espinola: Barbette Reichmann Other Clinician: Referring Shantay Sonn: Barbette Reichmann Treating Darrly Loberg/Extender: Rowan Blase in Treatment: 0 Vital Signs Time Taken: 11:52 Reference Range: 80 - 120 mg / dl Notes vitals not obtained, no open wound-consult only Electronic Signature(s) Signed: 08/14/2020 11:53:02 AM By: Rogers Blocker RN Entered By: Rogers Blocker on 08/14/2020 11:53:01

## 2020-08-14 NOTE — Progress Notes (Addendum)
AMED, DATTA ([id number]) Visit Report for 08/14/2020 Abuse/Suicide Risk Screen Details Patient Name: Ronald Huerta, Ronald Huerta. Date of Service: 08/14/2020 10:15 AM Medical Record Number: [id number] Patient Account Number: [id number] Date of Birth/Sex: 1934-03-11 (85 y.o. M) Treating RN: Rogers Blocker Primary Care Keneshia Tena: Barbette Reichmann Other Clinician: Referring Jo Booze: Barbette Reichmann Treating Kristain Hu/Extender: Rowan Blase in Treatment: 0 Abuse/Suicide Risk Screen Items Answer ABUSE RISK SCREEN: Has anyone close to you tried to hurt or harm you recentlyo No Do you feel uncomfortable with anyone in your familyo No Has anyone forced you do things that you didnot want to doo No Electronic Signature(s) Signed: 08/14/2020 12:00:23 PM By: Rogers Blocker RN Entered By: Rogers Blocker on 08/14/2020 12:00:22 Baack, [name] ([id number]) -------------------------------------------------------------------------------- Activities of Daily Living Details Patient Name: Ronald Huerta. Date of Service: 08/14/2020 10:15 AM Medical Record Number: [id number] Patient Account Number: [id number] Date of Birth/Sex: 06-07-1934 (85 y.o. M) Treating RN: Rogers Blocker Primary Care Rhylen Shaheen: Barbette Reichmann Other Clinician: Referring Starlene Consuegra: Barbette Reichmann Treating Delmar Dondero/Extender: Rowan Blase in Treatment: 0 Activities of Daily Living Items Answer Activities of Daily Living (Please select one for each item) Drive Automobile Not Able Take Medications Completely Able Use Telephone Completely Able Care for Appearance Completely Able Use Toilet Completely Able Bath / Shower Completely Able Dress Self Completely Able Feed Self Completely Able Walk Completely Able Get In / Out Bed Completely Able Housework Completely Able Prepare Meals Completely Able Handle Money Completely Able Shop for Self Completely Able Electronic Signature(s) Signed: 08/14/2020 12:00:42 PM  By: Rogers Blocker RN Entered By: Rogers Blocker on 08/14/2020 12:00:42 [place]well, [name] ([id number]) -------------------------------------------------------------------------------- Education Screening Details Patient Name: Ronald Huerta. Date of Service: 08/14/2020 10:15 AM Medical Record Number: [id number] Patient Account Number: [id number] Date of Birth/Sex: 1934/06/23 (85 y.o. M) Treating RN: Rogers Blocker Primary Care Nyiesha Beever: Barbette Reichmann Other Clinician: Referring Amea Mcphail: Barbette Reichmann Treating Minetta Krisher/Extender: Rowan Blase in Treatment: 0 Primary Learner Assessed: Patient Learning Preferences/Education Level/Primary Language Learning Preference: Demonstration, Communication Board, Printed Material Preferred Language: English Cognitive Barrier Language Barrier: Yes Patient is Recruitment consultant Needed: Yes Hospital Employed Language Interpreter Physical Barrier Impaired Vision: No Impaired Hearing: No Decreased Hand dexterity: No Motivation Anxiety Level: Calm Cooperation: Cooperative Interest in Health Problems: Asks Questions Perception: Coherent Willingness to Engage in Self-Management Medium Activities: Readiness to Engage in Self-Management Medium Activities: Electronic Signature(s) Signed: 08/14/2020 12:01:45 PM By: Rogers Blocker RN Entered By: Rogers Blocker on 08/14/2020 12:01:44 Croft, [name] ([id number]) -------------------------------------------------------------------------------- Fall Risk Assessment Details Patient Name: Ronald Huerta. Date of Service: 08/14/2020 10:15 AM Medical Record Number: [id number] Patient Account Number: [id number] Date of Birth/Sex: 01-19-1935 (85 y.o. M) Treating RN: Rogers Blocker Primary Care Tranice Laduke: Barbette Reichmann Other Clinician: Referring Anastassia Noack: Barbette Reichmann Treating Daniyla Pfahler/Extender: Rowan Blase in Treatment: 0 Fall Risk Assessment Items Have you had 2 or  more falls in the last 12 monthso 0 No Have you had any fall that resulted in injury in the last 12 monthso 0 No FALLS RISK SCREEN History of falling - immediate or within 3 months 0 No Secondary diagnosis (Do you have 2 or more medical diagnoseso) 0 No Ambulatory aid None/bed rest/wheelchair/nurse 0 Yes Crutches/cane/walker 0 No Furniture 0 No Intravenous therapy Access/Saline/Heparin Lock 0 No Gait/Transferring Normal/ bed rest/ wheelchair 0 Yes Weak (short steps with or without shuffle, stooped but able to lift head while walking, may 0 No seek support from furniture) Impaired (short steps with shuffle, may  have difficulty arising from chair, head down, impaired 0 No balance) Mental Status Oriented to own ability 0 Yes Electronic Signature(s) Signed: 08/14/2020 3:52:57 PM By: Rogers Blocker RN Entered By: Rogers Blocker on 08/14/2020 15:52:57 Kuzel, [name] ([id number]) -------------------------------------------------------------------------------- Foot Assessment Details Patient Name: Ronald Huerta. Date of Service: 08/14/2020 10:15 AM Medical Record Number: [id number] Patient Account Number: [id number] Date of Birth/Sex: 1934-12-01 (85 y.o. M) Treating RN: Rogers Blocker Primary Care Fable Huisman: Barbette Reichmann Other Clinician: Referring Eward Rutigliano: Barbette Reichmann Treating Kerissa Coia/Extender: Rowan Blase in Treatment: 0 Foot Assessment Items Site Locations + = Sensation present, - = Sensation absent, C = Callus, U = Ulcer R = Redness, W = Warmth, M = Maceration, PU = Pre-ulcerative lesion F = Fissure, S = Swelling, D = Dryness Assessment Right: Left: Other Deformity: No No Prior Foot Ulcer: No No Prior Amputation: No No Charcot Joint: No No Ambulatory Status: Ambulatory With Help Assistance Device: Wheelchair Gait: Academic librarian Signature(s) Signed: 08/14/2020 3:53:21 PM By: Rogers Blocker RN Entered By: Rogers Blocker on 08/14/2020  15:53:21 Jandreau, [name] ([id number]) -------------------------------------------------------------------------------- Nutrition Risk Screening Details Patient Name: Ronald Huerta. Date of Service: 08/14/2020 10:15 AM Medical Record Number: [id number] Patient Account Number: [id number] Date of Birth/Sex: 30-Mar-1934 (85 y.o. M) Treating RN: Rogers Blocker Primary Care Draeden Kellman: Barbette Reichmann Other Clinician: Referring Kennis Buell: Barbette Reichmann Treating Honestii Marton/Extender: Rowan Blase in Treatment: 0 Height (in): Weight (lbs): Body Mass Index (BMI): Nutrition Risk Screening Items Score Screening NUTRITION RISK SCREEN: I have an illness or condition that made me change the kind and/or amount of food I eat 0 No I eat fewer than two meals per day 0 No I eat few fruits and vegetables, or milk products 0 No I have three or more drinks of beer, liquor or wine almost every day 0 No I have tooth or mouth problems that make it hard for me to eat 0 No I don't always have enough money to buy the food I need 0 No I eat alone most of the time 0 No I take three or more different prescribed or over-the-counter drugs a day 0 No Without wanting to, I have lost or gained 10 pounds in the last six months 0 No I am not always physically able to shop, cook and/or feed myself 0 No Nutrition Protocols Good Risk Protocol 0 No interventions needed Moderate Risk Protocol High Risk Proctocol Risk Level: Good Risk Score: 0 Electronic Signature(s) Signed: 08/14/2020 3:53:10 PM By: Rogers Blocker RN Entered By: Rogers Blocker on 08/14/2020 15:53:10

## 2020-08-18 ENCOUNTER — Other Ambulatory Visit: Payer: Self-pay

## 2020-08-18 ENCOUNTER — Encounter: Payer: Self-pay | Admitting: Physician Assistant

## 2020-08-18 ENCOUNTER — Ambulatory Visit (INDEPENDENT_AMBULATORY_CARE_PROVIDER_SITE_OTHER): Payer: Medicare Other | Admitting: Physician Assistant

## 2020-08-18 VITALS — BP 118/67 | HR 67 | Ht 69.0 in | Wt 160.0 lb

## 2020-08-18 DIAGNOSIS — N401 Enlarged prostate with lower urinary tract symptoms: Secondary | ICD-10-CM

## 2020-08-18 DIAGNOSIS — N138 Other obstructive and reflux uropathy: Secondary | ICD-10-CM | POA: Diagnosis not present

## 2020-08-18 LAB — BLADDER SCAN AMB NON-IMAGING

## 2020-08-18 MED ORDER — TAMSULOSIN HCL 0.4 MG PO CAPS: 0.4000 mg | ORAL_CAPSULE | Freq: Every day | ORAL | 3 refills | Status: AC
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
## 2021-08-18 ENCOUNTER — Ambulatory Visit: Payer: Self-pay | Admitting: Urology

## 2021-08-18 DIAGNOSIS — R339 Retention of urine, unspecified: Secondary | ICD-10-CM

## 2021-08-18 DIAGNOSIS — N138 Other obstructive and reflux uropathy: Secondary | ICD-10-CM

## 2021-08-19 ENCOUNTER — Encounter: Payer: Self-pay | Admitting: Urology

## 2022-11-02 ENCOUNTER — Encounter: Payer: Self-pay | Admitting: Internal Medicine
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
## 2024-01-12 ENCOUNTER — Ambulatory Visit: Payer: Self-pay | Admitting: General Surgery

## 2024-01-15 ENCOUNTER — Inpatient Hospital Stay: Admission: RE | Admit: 2024-01-15 | Discharge: 2024-01-15 | Disposition: A | Source: Ambulatory Visit

## 2024-01-16 ENCOUNTER — Encounter
Admission: RE | Admit: 2024-01-16 | Discharge: 2024-01-16 | Disposition: A | Source: Ambulatory Visit | Attending: General Surgery

## 2024-01-16 ENCOUNTER — Other Ambulatory Visit: Payer: Self-pay

## 2024-01-16 DIAGNOSIS — Z01818 Encounter for other preprocedural examination: Secondary | ICD-10-CM | POA: Insufficient documentation

## 2024-01-16 DIAGNOSIS — R001 Bradycardia, unspecified: Secondary | ICD-10-CM | POA: Insufficient documentation

## 2024-01-16 LAB — CBC
HCT: 29.3 % — ABNORMAL LOW (ref 39.0–52.0)
Hemoglobin: 9.8 g/dL — ABNORMAL LOW (ref 13.0–17.0)
MCH: 33 pg (ref 26.0–34.0)
MCHC: 33.4 g/dL (ref 30.0–36.0)
MCV: 98.7 fL (ref 80.0–100.0)
Platelets: 211 K/uL (ref 150–400)
RBC: 2.97 MIL/uL — ABNORMAL LOW (ref 4.22–5.81)
RDW: 17.5 % — ABNORMAL HIGH (ref 11.5–15.5)
WBC: 5.1 K/uL (ref 4.0–10.5)
nRBC: 0 % (ref 0.0–0.2)

## 2024-01-16 LAB — BASIC METABOLIC PANEL WITH GFR
Anion gap: 9 (ref 5–15)
BUN: 24 mg/dL — ABNORMAL HIGH (ref 8–23)
CO2: 27 mmol/L (ref 22–32)
Calcium: 8.7 mg/dL — ABNORMAL LOW (ref 8.9–10.3)
Chloride: 101 mmol/L (ref 98–111)
Creatinine, Ser: 0.83 mg/dL (ref 0.61–1.24)
GFR, Estimated: >60 mL/min (ref >60)
Glucose, Bld: 84 mg/dL (ref 70–99)
Potassium: 4.2 mmol/L (ref 3.5–5.1)
Sodium: 137 mmol/L (ref 135–145)

## 2024-01-16 MED ORDER — ORAL CARE MOUTH RINSE: 15.0000 mL | Freq: Once | OROMUCOSAL | Status: AC

## 2024-01-16 MED ORDER — CEFAZOLIN SODIUM-DEXTROSE 2-4 GM/100ML-% IV SOLN
2.0000 g | INTRAVENOUS | Status: AC
  Administered 2024-01-17: 12:00:00 2 g via INTRAVENOUS

## 2024-01-16 MED ORDER — LACTATED RINGERS IV SOLN: INTRAVENOUS | Status: DC

## 2024-01-16 MED ORDER — CHLORHEXIDINE GLUCONATE 0.12 % MT SOLN
15.0000 mL | Freq: Once | OROMUCOSAL | Status: AC
  Administered 2024-01-17: 10:00:00 15 mL via OROMUCOSAL

## 2024-01-16 NOTE — Patient Instructions (Signed)
 Your procedure is scheduled on: To find out your arrival time, please call [phone] between 1PM - 3PM on: Report to the Registration Desk on the 1st floor of the Medical Mall. If your arrival time is 6:00 am, do not arrive before that time as the [facility] entrance doors do not open until 6:00 am.  REMEMBER: Instructions that are not followed completely may result in serious medical risk, up to and including death; or upon the discretion of your surgeon and anesthesiologist your surgery may need to be rescheduled.  Do not eat food after midnight the night before surgery.  No gum chewing or hard candies.  You may however, drink CLEAR liquids up to 2 hours before you are scheduled to arrive for your surgery. Do not drink anything within 2 hours of your scheduled arrival time.  Clear liquids include: - water  - apple juice without pulp - gatorade (not RED colors) - black coffee or tea (Do NOT add milk or creamers to the coffee or tea) Do NOT drink anything that is not on this list.  **Type 1 and Type 2 diabetics should only drink water.**  In addition, your doctor has ordered for you to drink the provided:  Ensure Pre-Surgery Clear Carbohydrate Drink  Gatorade G2 Drinking this carbohydrate drink up to two hours before surgery helps to reduce insulin resistance and improve patient outcomes. Please complete drinking 2 hours before scheduled arrival time.  One week prior to surgery: Stop Anti-inflammatories (NSAIDS) such as Advil , Aleve, Ibuprofen , Motrin , Naproxen, Naprosyn and Aspirin  based products such as Excedrin, Goody's Powder, BC Powder.  You may however, continue to take Tylenol  if needed for pain up until the day of surgery.  Stop ANY OVER THE COUNTER supplements and vitamins for at least 7 days until after surgery.  **Follow guidelines for insulin and diabetes medications.**  **Follow recommendations regarding stopping blood thinners.**  Continue taking all of your  other prescription medications up until the day of surgery.  ON THE DAY OF SURGERY ONLY TAKE THESE MEDICATIONS WITH SIPS OF WATER:    Use inhalers on the day of surgery and bring to the hospital.  Fleets enema or bowel prep as directed.  No Alcohol  for 24 hours before or after surgery.  No Smoking including e-cigarettes for 24 hours before surgery.  No chewable tobacco products for at least 6 hours before surgery.  No nicotine patches on the day of surgery.  Do not use any recreational drugs for at least a week (preferably 2 weeks) before your surgery.  Please be advised that the combination of cocaine and anesthesia may have negative outcomes, up to and including death. If you test positive for cocaine, your surgery will be cancelled.  On the morning of surgery brush your teeth with toothpaste and water, you may rinse your mouth with mouthwash if you wish. Do not swallow any toothpaste or mouthwash.  Use CHG Soap or wipes as directed on instruction sheet. (You can pick this up at our office in the [facility], the building to the left of the [employer], [address] at [address]  Do not shave body hair from the neck down 48 hours before surgery.  Do not wear lotions, powders, or perfumes.   Wear comfortable clothing (specific to your surgery type) to the hospital.  Do not wear jewelry, make-up, hairpins, clips or nail polish.  For welded (permanent) jewelry: bracelets, anklets, waist bands, etc.  Please have this  removed prior to surgery.  If it is not removed, there is a chance that hospital personnel will need to cut it off on the day of surgery.  Contact lenses, hearing aids and dentures may not be worn into surgery.  Do not bring valuables to the hospital. [facility] is not responsible for any missing/lost belongings or valuables.   Total Shoulder Arthroplasty:  use Benzoyl Peroxide 5% Gel as directed on instruction sheet.  Bring your  C-PAP to the hospital in case you may have to spend the night.   Notify your doctor if there is any change in your medical condition (cold, fever, infection).  After surgery, you can help prevent lung complications by doing breathing exercises.  Take deep breaths and cough every 1-2 hours. Your doctor may order a device called an Incentive Spirometer to help you take deep breaths.  When coughing or sneezing, hold a pillow firmly against your incision with both hands. This is called splinting. Doing this helps protect your incision. It also decreases belly discomfort.  If you are being admitted to the hospital overnight, leave your suitcase in the car. After surgery it may be brought to your room.  In case of increased patient census, it may be necessary for you, the patient, to continue your postoperative care in the Same Day Surgery department.  If you are being discharged the day of surgery, you will not be allowed to drive home. You will need a responsible individual to drive you home and stay with you for 24 hours after surgery.   If you are taking public transportation, you will need to have a responsible individual with you.  Please call the Pre-admissions Testing Dept. at [phone] if you have any questions about these instructions.  Surgery Visitation Policy:  Patients having surgery or a procedure may have two visitors.  Children under the age of 55 must have an adult with them who is not the patient.  Inpatient Visitation:    Visiting hours are 7 a.m. to 8 p.m. Up to four visitors are allowed at one time in a patient room. The visitors may rotate out with other people during the day.  One visitor age 68 or older may stay with the patient overnight and must be in the room by 8 p.m.   Merchandiser, Retail to address health-related social needs:  [email or web address]

## 2024-01-16 NOTE — Patient Instructions (Signed)
 Your procedure is scheduled on: Wednesday 01/17/24 To find out your arrival time, please call [phone] between 1PM - 3PM on: Tuesday 01/16/24 Report to the Registration Desk on the 1st floor of the [facility]. If your arrival time is 6:00 am, do not arrive before that time as the [facility] entrance doors do not open until 6:00 am.  REMEMBER: Instructions that are not followed completely may result in serious medical risk, up to and including death; or upon the discretion of your surgeon and anesthesiologist your surgery may need to be rescheduled.  Do not eat food or drink any liquids after midnight the night before surgery.  No gum chewing or hard candies.  One week prior to surgery: Stop Anti-inflammatories (NSAIDS) such as Advil , Aleve, Ibuprofen , Motrin , Naproxen, Naprosyn and Aspirin  based products such as Excedrin, Goody's Powder, BC Powder.  You may however, continue to take Tylenol  if needed for pain up until the day of surgery.  Stop ANY OVER THE COUNTER supplements and vitamins for at least 7 days until after surgery.  Continue taking all of your other prescription medications up until the day of surgery.  ON THE DAY OF SURGERY ONLY TAKE THESE MEDICATIONS WITH SIPS OF WATER:  doxycycline  (DORYX ) 100 MG EC tablet   No Alcohol  for 24 hours before or after surgery.  No Smoking including e-cigarettes for 24 hours before surgery.  No chewable tobacco products for at least 6 hours before surgery.  No nicotine patches on the day of surgery.  Do not use any recreational drugs for at least a week (preferably 2 weeks) before your surgery.  Please be advised that the combination of cocaine and anesthesia may have negative outcomes, up to and including death. If you test positive for cocaine, your surgery will be cancelled.  On the morning of surgery brush your teeth with toothpaste and water, you may rinse your mouth with mouthwash if you wish. Do not swallow any  toothpaste or mouthwash.  Shower prior to arrival  Do not shave body hair from the neck down 48 hours before surgery.  Do not wear lotions, powders, or perfumes.   Wear comfortable clothing (specific to your surgery type) to the hospital.  Do not wear jewelry, make-up, hairpins, clips or nail polish.  For welded (permanent) jewelry: bracelets, anklets, waist bands, etc.  Please have this removed prior to surgery.  If it is not removed, there is a chance that hospital personnel will need to cut it off on the day of surgery.  Contact lenses, hearing aids and dentures may not be worn into surgery.  Do not bring valuables to the hospital. [facility] is not responsible for any missing/lost belongings or valuables.   Notify your doctor if there is any change in your medical condition (cold, fever, infection).  After surgery, you can help prevent lung complications by doing breathing exercises.  Take deep breaths and cough every 1-2 hours. Your doctor may order a device called an Incentive Spirometer to help you take deep breaths.  If you are being discharged the day of surgery, you will not be allowed to drive home. You will need a responsible individual to drive you home and stay with you for 24 hours after surgery.   Please call the Pre-admissions Testing Dept. at [phone] if you have any questions about these instructions.  Surgery Visitation Policy:  Patients having surgery or a procedure may have two visitors.    Merchandiser, Retail to address health-related  social needs:  [email or web address]

## 2024-01-17 ENCOUNTER — Ambulatory Visit: Admitting: Urgent Care

## 2024-01-17 ENCOUNTER — Encounter: Admitting: Urgent Care

## 2024-01-17 ENCOUNTER — Encounter: Payer: Self-pay | Admitting: General Surgery

## 2024-01-17 ENCOUNTER — Other Ambulatory Visit: Payer: Self-pay

## 2024-01-17 ENCOUNTER — Encounter: Admission: RE | Disposition: A | Payer: Self-pay | Attending: General Surgery

## 2024-01-17 ENCOUNTER — Ambulatory Visit
Admission: RE | Admit: 2024-01-17 | Discharge: 2024-01-17 | Disposition: A | Attending: General Surgery | Admitting: General Surgery

## 2024-01-17 DIAGNOSIS — K219 Gastro-esophageal reflux disease without esophagitis: Secondary | ICD-10-CM | POA: Diagnosis not present

## 2024-01-17 DIAGNOSIS — H9190 Unspecified hearing loss, unspecified ear: Secondary | ICD-10-CM | POA: Diagnosis not present

## 2024-01-17 DIAGNOSIS — L720 Epidermal cyst: Secondary | ICD-10-CM | POA: Insufficient documentation

## 2024-01-17 DIAGNOSIS — N289 Disorder of kidney and ureter, unspecified: Secondary | ICD-10-CM | POA: Diagnosis not present

## 2024-01-17 HISTORY — PX: LESION EXCISION: SHX5167

## 2024-01-17 SURGERY — EXCISION, LESION, SCALP
Anesthesia: General | Site: Scalp | Wound class: "Clean "

## 2024-01-17 MED ORDER — ONDANSETRON HCL 4 MG/2ML IJ SOLN
INTRAMUSCULAR | Status: AC
  Filled 2024-01-17: qty 2

## 2024-01-17 MED ORDER — PROPOFOL 10 MG/ML IV BOLUS
INTRAVENOUS | Status: AC
  Filled 2024-01-17: qty 20

## 2024-01-17 MED ORDER — ACETAMINOPHEN 10 MG/ML IV SOLN
INTRAVENOUS | Status: AC
  Filled 2024-01-17: qty 100

## 2024-01-17 MED ORDER — BUPIVACAINE-EPINEPHRINE (PF) 0.5% -1:200000 IJ SOLN
INTRAMUSCULAR | Status: AC
  Filled 2024-01-17: qty 30

## 2024-01-17 MED ORDER — HYDROCODONE-ACETAMINOPHEN 5-325 MG PO TABS
1.0000 | ORAL_TABLET | Freq: Four times a day (QID) | ORAL | 0 refills | Status: AC | PRN
  Filled 2024-01-17: qty 12, 3d supply, fill #0

## 2024-01-17 MED ORDER — ACETAMINOPHEN 10 MG/ML IV SOLN
INTRAVENOUS | Status: DC | PRN
  Administered 2024-01-17: 12:00:00 1000 mg via INTRAVENOUS

## 2024-01-17 MED ORDER — ROCURONIUM BROMIDE 10 MG/ML (PF) SYRINGE
PREFILLED_SYRINGE | INTRAVENOUS | Status: AC
  Filled 2024-01-17: qty 10

## 2024-01-17 MED ORDER — PROPOFOL 10 MG/ML IV BOLUS
INTRAVENOUS | Status: DC | PRN
  Administered 2024-01-17: 11:00:00 100 mg via INTRAVENOUS

## 2024-01-17 MED ORDER — 0.9 % SODIUM CHLORIDE (POUR BTL) OPTIME
TOPICAL | Status: DC | PRN
  Administered 2024-01-17: 12:00:00 500 mL

## 2024-01-17 MED ORDER — LIDOCAINE HCL (PF) 2 % IJ SOLN
INTRAMUSCULAR | Status: AC
  Filled 2024-01-17: qty 5

## 2024-01-17 MED ORDER — ONDANSETRON HCL 4 MG/2ML IJ SOLN
INTRAMUSCULAR | Status: DC | PRN
  Administered 2024-01-17: 12:00:00 4 mg via INTRAVENOUS

## 2024-01-17 MED ORDER — LIDOCAINE HCL (CARDIAC) PF 100 MG/5ML IV SOSY
PREFILLED_SYRINGE | INTRAVENOUS | Status: DC | PRN
  Administered 2024-01-17: 11:00:00 50 mg via INTRAVENOUS

## 2024-01-17 MED ORDER — BUPIVACAINE-EPINEPHRINE 0.5% -1:200000 IJ SOLN
INTRAMUSCULAR | Status: DC | PRN
  Administered 2024-01-17: 12:00:00 30 mL

## 2024-01-17 MED ORDER — BUPIVACAINE-EPINEPHRINE (PF) 0.25% -1:200000 IJ SOLN
INTRAMUSCULAR | Status: AC
  Filled 2024-01-17: qty 30

## 2024-01-17 MED ADMIN — Sugammadex Sodium IV 200 MG/2ML (Base Equivalent): 200 mg | INTRAVENOUS | @ 12:00:00 | NDC 99999070036

## 2024-01-17 MED ADMIN — Rocuronium Bromide IV Soln 100 MG/10ML (10 MG/ML): 40 mg | INTRAVENOUS | @ 11:00:00 | NDC 70004085012

## 2024-01-17 MED ADMIN — Ephedrine Sulf-NaCl Soln Pref Syr 50 MG/10ML-0.9% (5 MG/ML): 10 mg | INTRAVENOUS | @ 12:00:00 | NDC 51754425001

## 2024-01-17 MED ADMIN — Dexamethasone Sod Phosphate Preservative Free Inj 10 MG/ML: 10 mg | INTRAVENOUS | @ 12:00:00 | NDC 70069002101

## 2024-01-17 MED ADMIN — Fentanyl Citrate Preservative Free (PF) Inj 100 MCG/2ML: 50 ug | INTRAVENOUS | @ 11:00:00 | NDC 72572017001

## 2024-01-17 MED FILL — Bupivacaine HCl Preservative Free (PF) Inj 0.5%: INTRAMUSCULAR | Qty: 30 | Status: CN

## 2024-01-17 MED FILL — Chlorhexidine Gluconate Soln 0.12%: OROMUCOSAL | Qty: 15 | Status: AC

## 2024-01-17 MED FILL — Fentanyl Citrate Preservative Free (PF) Inj 100 MCG/2ML: INTRAMUSCULAR | Qty: 2 | Status: AC

## 2024-01-17 MED FILL — Cefazolin Sodium-Dextrose IV Solution 2 GM/100ML-4%: INTRAVENOUS | Qty: 100 | Status: AC

## 2024-01-17 SURGICAL SUPPLY — 25 items
CHLORAPREP W/TINT 26 (MISCELLANEOUS) IMPLANT
DERMABOND ADVANCED .7 DNX12 (GAUZE/BANDAGES/DRESSINGS) ×1 IMPLANT
DRAPE LAPAROTOMY 77X122 PED (DRAPES) ×1 IMPLANT
ELECTRODE REM PT RTRN 9FT ADLT (ELECTROSURGICAL) ×1 IMPLANT
GAUZE 4X4 16PLY ~~LOC~~+RFID DBL (SPONGE) IMPLANT
GLOVE BIO SURGEON STRL SZ 6.5 (GLOVE) ×1 IMPLANT
GLOVE BIOGEL PI IND STRL 6.5 (GLOVE) ×1 IMPLANT
GLOVE SURG SYN 6.5 PF PI (GLOVE) ×2 IMPLANT
GOWN STRL REUS W/ TWL LRG LVL3 (GOWN DISPOSABLE) ×1 IMPLANT
KIT TURNOVER KIT A (KITS) ×1 IMPLANT
LABEL OR SOLS (LABEL) ×1 IMPLANT
MANIFOLD NEPTUNE II (INSTRUMENTS) ×1 IMPLANT
NDL HYPO 22X1.5 SAFETY MO (MISCELLANEOUS) ×1 IMPLANT
NEEDLE HYPO 22X1.5 SAFETY MO (MISCELLANEOUS) ×1 IMPLANT
NS IRRIG 500ML POUR BTL (IV SOLUTION) ×1 IMPLANT
PACK BASIN MINOR ARMC (MISCELLANEOUS) ×1 IMPLANT
SOLN STERILE WATER 500 ML (IV SOLUTION) ×1 IMPLANT
SPONGE VERSALON 4X4 4PLY (MISCELLANEOUS) IMPLANT
SUT ETHILON 2 0 FS 18 (SUTURE) IMPLANT
SUT ETHILON 3-0 (SUTURE) IMPLANT
SUT VIC AB 2-0 SH 27XBRD (SUTURE) ×1 IMPLANT
SUTURE MNCRL 4-0 27XMF (SUTURE) ×1 IMPLANT
SYR 10ML LL (SYRINGE) ×1 IMPLANT
TAPE CLOTH SURG 4X10 WHT LF (GAUZE/BANDAGES/DRESSINGS) IMPLANT
TRAP FLUID SMOKE EVACUATOR (MISCELLANEOUS) ×1 IMPLANT

## 2024-01-17 NOTE — Transfer of Care (Signed)
 Immediate Anesthesia Transfer of Care Note  Patient: Ronald Huerta  Procedure(s) Performed: EXCISION, LESION, SCALP (Scalp)  Patient Location: PACU  Anesthesia Type:General  Level of Consciousness: drowsy and confused  Airway & Oxygen Therapy: Patient Spontanous Breathing  Post-op Assessment: Report given to RN and Post -op Vital signs reviewed and stable  Post vital signs: Reviewed and stable  Last Vitals:  Vitals Value Taken Time  BP 124/105 01/17/24 12:19  Temp 37 C 01/17/24 12:19  Pulse 89 01/17/24 12:19  Resp 23 01/17/24 12:19  SpO2 99 % 01/17/24 12:19  Vitals shown include unfiled device data.  Last Pain:  Vitals:   01/17/24 1219  TempSrc: Tympanic         Complications: No notable events documented.

## 2024-01-17 NOTE — Anesthesia Postprocedure Evaluation (Signed)
 Anesthesia Post Note  Patient: Ronald Huerta  Procedure(s) Performed: EXCISION, LESION, SCALP (Scalp)  Patient location during evaluation: PACU Anesthesia Type: General Level of consciousness: awake and alert Pain management: pain level controlled Vital Signs Assessment: post-procedure vital signs reviewed and stable Respiratory status: spontaneous breathing, nonlabored ventilation, respiratory function stable and patient connected to nasal cannula oxygen Cardiovascular status: blood pressure returned to baseline and stable Postop Assessment: no apparent nausea or vomiting Anesthetic complications: no   No notable events documented.   Last Vitals:  Vitals:   01/17/24 1219 01/17/24 1255  BP: (!) 124/105 (!) 146/71  Pulse: 90 76  Resp: (!) 23 18  Temp: 37 C (!) 36.2 C  SpO2: 99% 97%    Last Pain:  Vitals:   01/17/24 1255  TempSrc: Temporal  PainSc: 0-No pain                 Debby Mines
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
## 2024-01-17 NOTE — Discharge Instructions (Addendum)
°  Diet: Resume home heart healthy regular diet.   Activity: Increase activity as tolerated.   Wound care: Remove dressing tomorrow. Once dressing removed, may shower with soapy water and pat dry (do not rub incisions), but no baths or submerging incision underwater until follow-up. (no swimming)   Apply dry gauze and tape after taking a shower. Dressing may be changed as needed if it gets wet.   Medications: Resume all home medications. For mild to moderate pain: acetaminophen  (Tylenol ) or ibuprofen  (if no kidney disease). Combining Tylenol  with alcohol  can substantially increase your risk of causing liver disease. Narcotic pain medications, if prescribed, can be used for severe pain, though may cause nausea, constipation, and drowsiness. Do not combine Tylenol  and Norco within a 6 hour period as Norco contains Tylenol . If you do not need the narcotic pain medication, you do not need to fill the prescription.  Call office [phone]) at any time if any questions, worsening pain, fevers/chills, bleeding, drainage from incision site, or other concerns.

## 2024-01-17 NOTE — Op Note (Signed)
 OPERATION REPORT  Pre Operative Diagnosis: Scalp mass  Post operative diagnosis: Scalp infected epidermoid cyst  Anesthesia: General and Local   Surgeon: Dr. Rodolph   Indication: This 89 y.o. year old male with a soft tissue mass on the posterior scalp with infection   Description of procedure: after orienting patient about the procedure steps and benefits and patient agreed to proceed. Time out was done identifying correct patient and location of procedure. After induction of general anesthesia, patient was placed on right lateral decubitus position. Local anesthesia was infiltrated around the palpable lesion. With a blade #15, an elliptical incision was made using the skin lines. Sharp dissection was carried down and lesion was excised including dermal tissue. The mass measured 6 cm. Due to infected cyst, the cavity was irrigated with peroxide and saline. Skin partially approximated with two 2-0 Nylon sutures. Partially open to let the area drain. Specimen sent to pathology.    Complications: none   EBL: minimal  Lucas Rodolph, MD, FACS

## 2024-01-17 NOTE — Anesthesia Procedure Notes (Signed)
 Procedure Name: Intubation Date/Time: 01/17/2024 11:29 AM  Performed by: Leavy Ned, MDPre-anesthesia Checklist: Patient identified, Emergency Drugs available, Suction available, Patient being monitored and Timeout performed Patient Re-evaluated:Patient Re-evaluated prior to induction Oxygen Delivery Method: Circle system utilized Preoxygenation: Pre-oxygenation with 100% oxygen Induction Type: IV induction Ventilation: Mask ventilation without difficulty Laryngoscope Size: McGrath and 3 Grade View: Grade I Tube type: Oral Tube size: 7.0 mm Number of attempts: 1 Airway Equipment and Method: Stylet and Video-laryngoscopy Placement Confirmation: ETT inserted through vocal cords under direct vision, positive ETCO2 and breath sounds checked- equal and bilateral Secured at: 21 cm Tube secured with: Tape Dental Injury: Teeth and Oropharynx as per pre-operative assessment

## 2024-01-17 NOTE — Interval H&P Note (Signed)
 History and Physical Interval Note:  01/17/2024 9:19 AM  [facility]  has presented today for surgery, with the diagnosis of L72.0 epidermoid cyst of skin of scalp.  The various methods of treatment have been discussed with the patient and family. After consideration of risks, benefits and other options for treatment, the patient has consented to  Procedures: EXCISION, LESION, SCALP (N/A) as a surgical intervention.  The patient's history has been reviewed, patient examined, no change in status, stable for surgery.  I have reviewed the patient's chart and labs.  Questions were answered to the patient's satisfaction.     Ronald Huerta

## 2024-01-18 ENCOUNTER — Encounter: Payer: Self-pay | Admitting: General Surgery

## 2024-01-18 ENCOUNTER — Other Ambulatory Visit: Payer: Self-pay

## 2024-01-18 ENCOUNTER — Emergency Department
Admission: EM | Admit: 2024-01-18 | Discharge: 2024-01-18 | Disposition: A | Discharge status: Home / Self Care | Attending: Emergency Medicine | Admitting: Emergency Medicine

## 2024-01-18 DIAGNOSIS — Z4889 Encounter for other specified surgical aftercare: Secondary | ICD-10-CM | POA: Diagnosis present

## 2024-01-18 DIAGNOSIS — F0280 Dementia in other diseases classified elsewhere without behavioral disturbance: Secondary | ICD-10-CM | POA: Diagnosis not present

## 2024-01-18 DIAGNOSIS — E039 Hypothyroidism, unspecified: Secondary | ICD-10-CM | POA: Diagnosis not present

## 2024-01-18 LAB — SURGICAL PATHOLOGY

## 2024-01-18 NOTE — Discharge Instructions (Addendum)
 You were seen in the emergency department for a postsurgical wound check.  The general surgery team came to evaluate your wound.  Please follow their recommendations regarding wound care.  You may place gauze and tape on the area and follow-up with them in office.  Return to the emergency department if you experience fever, increasing redness, swelling puslike drainage, a lot of bleeding from the area, or any other new, worsening or concerning symptoms.

## 2024-01-18 NOTE — ED Triage Notes (Signed)
 Pt comes with c/o laceration to back of head. Pt has bandage in place. Pt has large open gash to back of head. Pt has cyst removed per son yesterday and they only placed two  stiches it opened up and he needs more stiches.

## 2024-01-18 NOTE — ED Provider Notes (Signed)
 [facility] Provider Note    Event Date/Time   First MD Initiated Contact with Patient 01/18/24 [phone]     (approximate)   History   Laceration   HPI  Ronald Huerta is a 89 y.o. male  with a past medical history of dementia, deaf non-speaking uses sign language, RLS, BPH, GERD presents to the emergency department with post-procedure laceration to the back of his head in the occipital region.  Son who is his medical guardian/POA is present with him.  I reviewed the medical chart. Patient had scalp-infected epidermoid cyst removed yesterday with General Surgeon, Dr. Rodolph. Had two 2-0 Nylon sutures placed and partially left open to drain.  Son is present with him and helping translate using ALS virtual interpreter.  The patient's son is unsure if the stitches were left open to drain at this level of a gap, or if they popped open/if the patient needs more stitches. The wound has been draining clear fluid and blood per the son. He denies fever, erythema, edema. Patient is not on any blood thinners.    Physical Exam   Triage Vital Signs: ED Triage Vitals  Encounter Vitals Group     BP 01/18/24 0907 (!) 142/70     Girls Systolic BP Percentile --      Girls Diastolic BP Percentile --      Boys Systolic BP Percentile --      Boys Diastolic BP Percentile --      Pulse Rate 01/18/24 0907 69     Resp 01/18/24 0907 18     Temp 01/18/24 0907 97.8 F (36.6 C)     Temp src --      SpO2 01/18/24 0907 100 %     Weight 01/18/24 0906 150 lb (68 kg)     Height 01/18/24 0906 5' 8 (1.727 m)     Head Circumference --      Peak Flow --      Pain Score 01/18/24 0906 0     Pain Loc --      Pain Education --      Exclude from Growth Chart --     Most recent vital signs: Vitals:   01/18/24 0907  BP: (!) 142/70  Pulse: 69  Resp: 18  Temp: 97.8 F (36.6 C)  SpO2: 100%    General: Awake, in no acute distress. Appears stated age. Head:  Normocephalic. Neck: Supple. CV: Good peripheral perfusion.  Respiratory:Normal respiratory effort.  No respiratory distress.  GI: Soft, non-distended. Skin:Warm, dry, see images below for post-procedure laceration to occipital region of scalp with partial approximation with 2 nylon sutures, draining minimal serosanguinous fluid on gauze that covered the wound. No surrounding erythema or edema.         ED Results / Procedures / Treatments   Labs (all labs ordered are listed, but only abnormal results are displayed) Labs Reviewed - No data to display   EKG     RADIOLOGY    PROCEDURES:  Critical Care performed: No   Procedures   MEDICATIONS ORDERED IN ED: Medications - No data to display   IMPRESSION / MDM / ASSESSMENT AND PLAN / ED COURSE  I reviewed the triage vital signs and the nursing notes.                              Differential diagnosis includes, but is not limited to, post-op wound check,  wound infection, laceration  Patient's presentation is most consistent with acute, uncomplicated illness.  Patient is an 89 year old male presenting for postop wound check with his son who is over his care.  Wound does not show any signs of infection.  It has drained minimal serosanguineous fluid.  Given the depth of the wound and unsure if the stitches were meant to be placed this loosely or if they dehisced, I did contact the patient's general surgeon team to come evaluate the wound (Dr. Rodolph and [address] [place], [place]).  They reported they have no concerns regarding the open wound and he can place gauze and tape over the area and follow their wound care instructions.  Son states the patient is at baseline regarding his dementia at this time.  The patient and son can follow-up with their general surgery team outpatient.  Patient's guardian was given the opportunity to ask questions; all questions were answered. The patient may return to the emergency  department for any signs of wound infection or any new, worsening, or concerning symptoms. Emergency department return precautions were discussed with the patient's guardian.  Patient's guardian is in agreement to the treatment plan.  Patient is stable for discharge.     FINAL CLINICAL IMPRESSION(S) / ED DIAGNOSES   Final diagnoses:  Encounter for post surgical wound check     Rx / DC Orders   ED Discharge Orders     None        Note:  This document was prepared using Dragon voice recognition software and may include unintentional dictation errors.     Sheron Salm, PA-C 01/18/24 1129    Jacolyn Pae, MD 01/18/24 1158
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
